# Patient Record
Sex: Male | Born: 1980 | Race: White | Hispanic: No | Marital: Married | Smoking: Former smoker
Health system: Southern US, Community
[De-identification: ages and names within clinical notes are randomized; demographics above are authoritative.]

## PROBLEM LIST (undated history)

## (undated) DIAGNOSIS — K852 Alcohol induced acute pancreatitis without necrosis or infection: Secondary | ICD-10-CM

## (undated) DIAGNOSIS — S83249A Other tear of medial meniscus, current injury, unspecified knee, initial encounter: Secondary | ICD-10-CM

## (undated) DIAGNOSIS — I10 Essential (primary) hypertension: Secondary | ICD-10-CM

## (undated) DIAGNOSIS — Z98811 Dental restoration status: Secondary | ICD-10-CM

## (undated) DIAGNOSIS — K219 Gastro-esophageal reflux disease without esophagitis: Secondary | ICD-10-CM

## (undated) DIAGNOSIS — Z8719 Personal history of other diseases of the digestive system: Secondary | ICD-10-CM

## (undated) HISTORY — DX: Alcohol induced acute pancreatitis without necrosis or infection: K85.20

## (undated) HISTORY — PX: ANTERIOR CRUCIATE LIGAMENT REPAIR: SHX115

## (undated) HISTORY — DX: Essential (primary) hypertension: I10

---

## 1999-10-03 ENCOUNTER — Ambulatory Visit (HOSPITAL_BASED_OUTPATIENT_CLINIC_OR_DEPARTMENT_OTHER): Admission: RE | Admit: 1999-10-03 | Discharge: 1999-10-04 | Payer: Self-pay | Admitting: Orthopedic Surgery

## 2012-01-12 ENCOUNTER — Encounter (INDEPENDENT_AMBULATORY_CARE_PROVIDER_SITE_OTHER): Payer: Self-pay | Admitting: Surgery

## 2012-01-14 ENCOUNTER — Encounter (INDEPENDENT_AMBULATORY_CARE_PROVIDER_SITE_OTHER): Payer: Self-pay | Admitting: Surgery

## 2012-01-16 ENCOUNTER — Ambulatory Visit (INDEPENDENT_AMBULATORY_CARE_PROVIDER_SITE_OTHER): Payer: BC Managed Care – PPO | Admitting: General Surgery

## 2012-01-16 ENCOUNTER — Encounter (INDEPENDENT_AMBULATORY_CARE_PROVIDER_SITE_OTHER): Payer: Self-pay | Admitting: General Surgery

## 2012-01-16 VITALS — BP 138/90 | HR 64 | Temp 97.6°F | Resp 16 | Ht 72.0 in | Wt 230.8 lb

## 2012-01-16 DIAGNOSIS — K645 Perianal venous thrombosis: Secondary | ICD-10-CM

## 2012-01-16 NOTE — Progress Notes (Signed)
Patient ID: Shane Nguyen, male   DOB: [birth year], 31 y.o.   MRN: [id number]  Chief Complaint  Patient presents with  . Hemorrhoids    urgent- eval thromb hems    HPI Shane Nguyen is a 31 y.o. male.  He is referred by Dr. Jolyne Loa for management of a thrombosed external hemorrhoid.  The patient thinks he had a thrombosed hemorrhoid 2 years ago, took suppositories, spontaneously drained. He has occasional hemorrhoid discomfort when he gets constipated and doesn't see any blood.  He now has a 6 day history of pain and swelling but no bleeding in his perianal area.  Comorbidities include hypertension and anxiety. HPI  Past Medical History  Diagnosis Date  . Hypertension   . Hemorrhoids     Past Surgical History  Procedure Date  . Anterior cruciate ligament repair     bilateral    History reviewed. No pertinent family history.  Social History History  Substance Use Topics  . Smoking status: Former Smoker    Quit date: 02/11/2004  . Smokeless tobacco: Not on file  . Alcohol Use: No    No Known Allergies  Current Outpatient Prescriptions  Medication Sig Dispense Refill  . ALPRAZolam (XANAX PO) Take by mouth.      Marland Kitchen HYDRALAZINE-HCTZ PO Take by mouth.      . hydrocortisone (ANUSOL-HC) 25 MG suppository daily.        Review of Systems Review of Systems  Constitutional: Negative for fever, chills and unexpected weight change.  HENT: Negative for hearing loss, congestion, sore throat, trouble swallowing and voice change.   Eyes: Negative for visual disturbance.  Respiratory: Negative for cough and wheezing.   Cardiovascular: Negative for chest pain, palpitations and leg swelling.  Gastrointestinal: Positive for rectal pain. Negative for nausea, vomiting, abdominal pain, diarrhea, constipation, blood in stool, abdominal distention and anal bleeding.  Genitourinary: Negative for hematuria and difficulty urinating.  Musculoskeletal: Negative for arthralgias.   Skin: Negative for rash and wound.  Neurological: Negative for seizures, syncope, weakness and headaches.  Hematological: Negative for adenopathy. Does not bruise/bleed easily.  Psychiatric/Behavioral: Negative for confusion.    Blood pressure 138/90, pulse 64, temperature 97.6 F (36.4 C), temperature source Temporal, resp. rate 16, height 6' (1.829 m), weight 230 lb 12.8 oz (104.69 kg).  Physical Exam Physical Exam  Constitutional: He is oriented to person, place, and time. He appears well-developed and well-nourished. No distress.  HENT:  Head: Normocephalic and atraumatic.  Genitourinary:       Large thrombosed external hemorrhoid,  left lateral. After informed consent this area was prepped with alcohol, anesthetized with 1% Xylocaine with epinephrine, and conservatively excised and unroofed. Hemostasis excellent. Tolerated well. Dry gauze bandage.  Musculoskeletal: Normal range of motion. He exhibits no edema and no tenderness.  Neurological: He is alert and oriented to person, place, and time. Coordination normal.  Skin: Skin is warm and dry. No rash noted. He is not diaphoretic. No erythema. No pallor.  Psychiatric: He has a normal mood and affect. His behavior is normal. Judgment and thought content normal.    Data Reviewed Office note from Dr. Kathryne Sharper office.  Assessment    Thrombosed external hemorrhoid, left lateral    Plan    Thrombosed hemorrhoid excised in the office today. Tolerated well.  Wound care instructions given. Bowel regimen discussed.  Return if this does not heal completely in 2 weeks.       Angelia Mould. Derrell Lolling, M.D., [facility] Office:   [phone] Pager:   [phone]  01/16/2012, 4:09 PM

## 2012-01-16 NOTE — Patient Instructions (Signed)
We excised a thrombosed hemorrhoid on the left lateral position today. This leaves an open wound that should completely heal and in 10-14 days.  Take a stool softener twice a day and avoid constipation.  Take a warm tub bath 3 times a day.  Expect some drainage, wear a pad.  Use baby wipes  for 2 weeks.  Return to see Dr. Derrell Lolling if this does not heal completely in 2 weeks.

## 2012-01-19 ENCOUNTER — Ambulatory Visit (INDEPENDENT_AMBULATORY_CARE_PROVIDER_SITE_OTHER): Payer: Self-pay | Admitting: Surgery

## 2012-01-22 ENCOUNTER — Encounter (INDEPENDENT_AMBULATORY_CARE_PROVIDER_SITE_OTHER): Payer: Self-pay | Admitting: General Surgery

## 2012-01-22 NOTE — Progress Notes (Signed)
Faxed office visit note from 01/16/12 tot he attention of Lupita Leash at [facility].  FAX # [phone]. Confirmation received.

## 2013-01-12 ENCOUNTER — Encounter: Payer: Self-pay | Admitting: Physician Assistant

## 2013-01-12 DIAGNOSIS — E785 Hyperlipidemia, unspecified: Secondary | ICD-10-CM

## 2013-01-12 DIAGNOSIS — E559 Vitamin D deficiency, unspecified: Secondary | ICD-10-CM | POA: Insufficient documentation

## 2013-01-12 DIAGNOSIS — E782 Mixed hyperlipidemia: Secondary | ICD-10-CM | POA: Insufficient documentation

## 2013-01-12 DIAGNOSIS — I10 Essential (primary) hypertension: Secondary | ICD-10-CM

## 2013-01-13 NOTE — Progress Notes (Signed)
Patient ID: Shane Nguyen, male   DOB: January 25, 1981, 32 y.o.   MRN: [id number]  Annual Screening Comprehensive Examination  This very nice 32 yo yo  WM presents for complete physical.  Patient has been followed for HTN, Hyperlipidemia, and Vitamin D Deficiency.   Patient's BP has been controlled at home. Patient denies any cardiac symptoms as chest pain, palpitations, shortness of breath, dizziness or ankle swelling.   Patient's hyperlipidemia is controlled with diet and exercise. Patient denies myalgias or other medication SE's. Last cholesterol last visit was  , triglycerides  , and LDL   .     Patient has prediabetes/insulin resistance with last A1c    / insulin   . Patient denies reactive h   Finally, patient has history of Vitamin D Deficiency with last vitamin D      Current Outpatient Prescriptions on File Prior to Visit  Medication Sig Dispense Refill  . ALPRAZolam (XANAX PO) Take by mouth.      . bisoprolol-hydrochlorothiazide (ZIAC) 5-6.25 MG per tablet Take 1 tablet by mouth daily.      . cholecalciferol (VITAMIN D) 1000 UNITS tablet Take 1,000 Units by mouth daily.      . Flaxseed, Linseed, (FLAXSEED OIL) 1000 MG CAPS Take by mouth.      . loratadine (CLARITIN) 10 MG tablet Take 10 mg by mouth daily.      . Melatonin 1 MG CAPS Take by mouth.      . Omega-3 Fatty Acids (FISH OIL) 1000 MG CPDR Take by mouth.       No current facility-administered medications on file prior to visit.    No Known Allergies  Past Medical History  Diagnosis Date  . Hypertension   . Hemorrhoids   . Hyperlipidemia   . Vitamin D deficiency   . Insomnia     Past Surgical History  Procedure Laterality Date  . Anterior cruciate ligament repair      bilateral  . Knee arthroscopy Right 1997    murphy    Family History  Problem Relation Age of Onset  . Hypertension Father   . Hyperlipidemia Father     History   Social History  . Marital Status: Married    Spouse Name: N/A    Number  of Children: N/A  . Years of Education: N/A   Occupational History  . Not on file.   Social History Main Topics  . Smoking status: Former Smoker -- 1.00 packs/day for 10 years    Quit date: 02/11/2004  . Smokeless tobacco: Never Used  . Alcohol Use: Yes     Comment: 1/2 of a 5th q weekend  . Drug Use: No  . Sexual Activity: Not on file   Other Topics Concern  . Not on file   Social History Narrative  . No narrative on file    ROS Constitutional: Denies fever, chills, weight loss/gain, headaches, insomnia, fatigue, night sweats, and change in appetite. Eyes: Denies redness, blurred vision, diplopia, discharge, itchy, watery eyes.  ENT: Denies discharge, congestion, post nasal drip, epistaxis, sore throat, earache, hearing loss, dental pain, Tinnitus, Vertigo, Sinus pain, snoring.  Cardio: Denies chest pain, palpitations, irregular heartbeat, syncope, dyspnea, diaphoresis, orthopnea, PND, claudication, edema Respiratory: denies cough, dyspnea, DOE, pleurisy, hoarseness, laryngitis, wheezing.  Gastrointestinal: Denies dysphagia, heartburn, reflux, water brash, pain, cramps, nausea, vomiting, bloating, diarrhea, constipation, hematemesis, melena, hematochezia, jaundice, hemorrhoids Genitourinary: Denies dysuria, frequency, urgency, nocturia, hesitancy, discharge, hematuria, flank pain Musculoskeletal: Denies arthralgia, myalgia, stiffness, Jt.  Swelling, pain, limp, and strain/sprain. Skin: Denies puritis, rash, hives, warts, acne, eczema, changing in skin lesion Neuro: No weakness, tremor, incoordination, spasms, paresthesia, pain Psychiatric: Denies confusion, memory loss, sensory loss Endocrine: Denies change in weight, skin, hair change, nocturia, and paresthesia, diabetic polys, visual blurring, hyper / hypo glycemic episodes.  Heme/Lymph: No excessive bleeding, bruising, or elarged lymph nodes.  There were no vitals filed for this visit.  Estimated body mass index is 31.30  kg/(m^2) as calculated from the following:   Height as of 01/16/12: 6' (1.829 m).   Weight as of 01/16/12: 230 lb 12.8 oz (104.69 kg).  Physical Exam General Appearance: Well nourished, in no apparent distress. Eyes: PERRLA, EOMs, conjunctiva no swelling or erythema, normal fundi and vessels. Sinuses: No frontal/maxillary tenderness ENT/Mouth: EACs patent / TMs  nl. Nares clear without erythema, swelling, mucoid exudates. Oral hygiene is good. No erythema, swelling, or exudate. Tongue normal, non-obstructing. Tonsils not swollen or erythematous. Hearing normal.  Neck: Supple, thyroid normal. No bruits, nodes or JVD. Respiratory: Respiratory effort normal.  BS equal and clear bilateral without rales, rhonci, wheezing or stridor. Cardio: Heart sounds are normal with regular rate and rhythm and no murmurs, rubs or gallops. Peripheral pulses are normal and equal bilaterally without edema. No aortic or femoral bruits. Chest: symmetric with normal excursions and percussion.  Abdomen: Flat, soft, with bowl sounds. Nontender, no guarding, rebound, hernias, masses, or organomegaly.  Lymphatics: Non tender without lymphadenopathy.  Genitourinary: No hernias.Testes nl. DRE - prostate nl for age - smooth & firm w/o nodules. Musculoskeletal: Full ROM all peripheral extremities, joint stability, 5/5 strength, and normal gait. Skin: Warm and dry without rashes, lesions, cyanosis, clubbing or  ecchymosis.  Neuro: Cranial nerves intact, reflexes equal bilaterally. Normal muscle tone, no cerebellar symptoms. Sensation intact.  Pysch: Awake and oriented X 3, normal affect, insight and judgment appropriate.   Assessment and Plan  1. Annual Screening Examination 2. Hypertension  3. Hyperlipidemia 4. Pre Diabetes 5. Vitamin D Deficiency  Continue prudent diet as discussed, weight control, BP monitoring, regular exercise, and medications as discussed.  Discussed med effects and SE's. Routine screening labs and  tests as requested with regular follow-up as recommended.  This encounter was created in error - please disregard.

## 2013-01-13 NOTE — Patient Instructions (Signed)
Continue diet & medications same as discussed.   Further disposition pending lab results.     Hypertension As your heart beats, it forces blood through your arteries. This force is your blood pressure. If the pressure is too high, it is called hypertension (HTN) or high blood pressure. HTN is dangerous because you may have it and not know it. High blood pressure may mean that your heart has to work harder to pump blood. Your arteries may be narrow or stiff. The extra work puts you at risk for heart disease, stroke, and other problems.  Blood pressure consists of two numbers, a higher number over a lower, 110/72, for example. It is stated as "110 over 72." The ideal is below 120 for the top number (systolic) and under 80 for the bottom (diastolic). Write down your blood pressure today. You should pay close attention to your blood pressure if you have certain conditions such as:  Heart failure.  Prior heart attack.  Diabetes  Chronic kidney disease.  Prior stroke.  Multiple risk factors for heart disease. To see if you have HTN, your blood pressure should be measured while you are seated with your arm held at the level of the heart. It should be measured at least twice. A one-time elevated blood pressure reading (especially in the Emergency Department) does not mean that you need treatment. There may be conditions in which the blood pressure is different between your right and left arms. It is important to see your caregiver soon for a recheck. Most people have essential hypertension which means that there is not a specific cause. This type of high blood pressure may be lowered by changing lifestyle factors such as:  Stress.  Smoking.  Lack of exercise.  Excessive weight.  Drug/tobacco/alcohol use.  Eating less salt. Most people do not have symptoms from high blood pressure until it has caused damage to the body. Effective treatment can often prevent, delay or reduce that  damage. TREATMENT  When a cause has been identified, treatment for high blood pressure is directed at the cause. There are a large number of medications to treat HTN. These fall into several categories, and your caregiver will help you select the medicines that are best for you. Medications may have side effects. You should review side effects with your caregiver. If your blood pressure stays high after you have made lifestyle changes or started on medicines,   Your medication(s) may need to be changed.  Other problems may need to be addressed.  Be certain you understand your prescriptions, and know how and when to take your medicine.  Be sure to follow up with your caregiver within the time frame advised (usually within two weeks) to have your blood pressure rechecked and to review your medications.  If you are taking more than one medicine to lower your blood pressure, make sure you know how and at what times they should be taken. Taking two medicines at the same time can result in blood pressure that is too low. SEEK IMMEDIATE MEDICAL CARE IF:  You develop a severe headache, blurred or changing vision, or confusion.  You have unusual weakness or numbness, or a faint feeling.  You have severe chest or abdominal pain, vomiting, or breathing problems. MAKE SURE YOU:   Understand these instructions.  Will watch your condition.  Will get help right away if you are not doing well or get worse. Document Released: 01/27/2005 Document Revised: 04/21/2011 Document Reviewed: 09/17/2007 Exit[employer] Patient Information 2014   [employer]. Cholesterol Cholesterol is a white, waxy, fat-like protein needed by your body in small amounts. The liver makes all the cholesterol you need. It is carried from the liver by the blood through the blood vessels. Deposits (plaque) may build up on blood vessel walls. This makes the arteries narrower and stiffer. Plaque increases the risk for heart attack and  stroke. You cannot feel your cholesterol level even if it is very high. The only way to know is by a blood test to check your lipid (fats) levels. Once you know your cholesterol levels, you should keep a record of the test results. Work with your caregiver to to keep your levels in the desired range. WHAT THE RESULTS MEAN:  Total cholesterol is a rough measure of all the cholesterol in your blood.  LDL is the so-called bad cholesterol. This is the type that deposits cholesterol in the walls of the arteries. You want this level to be low.  HDL is the good cholesterol because it cleans the arteries and carries the LDL away. You want this level to be high.  Triglycerides are fat that the body can either burn for energy or store. High levels are closely linked to heart disease. DESIRED LEVELS:  Total cholesterol below 200.  LDL below 100 for people at risk, below 70 for very high risk.  HDL above 50 is good, above 60 is best.  Triglycerides below 150. HOW TO LOWER YOUR CHOLESTEROL:  Diet.  Choose fish or white meat chicken and turkey, roasted or baked. Limit fatty cuts of red meat, fried foods, and processed meats, such as sausage and lunch meat.  Eat lots of fresh fruits and vegetables. Choose whole grains, beans, pasta, potatoes and cereals.  Use only small amounts of olive, corn or canola oils. Avoid butter, mayonnaise, shortening or palm kernel oils. Avoid foods with trans-fats.  Use skim/nonfat milk and low-fat/nonfat yogurt and cheeses. Avoid whole milk, cream, ice cream, egg yolks and cheeses. Healthy desserts include angel food cake, ginger snaps, animal crackers, hard candy, popsicles, and low-fat/nonfat frozen yogurt. Avoid pastries, cakes, pies and cookies.  Exercise.  A regular program helps decrease LDL and raises HDL.  Helps with weight control.  Do things that increase your activity level like gardening, walking, or taking the stairs.  Medication.  May be  prescribed by your caregiver to help lowering cholesterol and the risk for heart disease.  You may need medicine even if your levels are normal if you have several risk factors. HOME CARE INSTRUCTIONS   Follow your diet and exercise programs as suggested by your caregiver.  Take medications as directed.  Have blood work done when your caregiver feels it is necessary. MAKE SURE YOU:   Understand these instructions.  Will watch your condition.  Will get help right away if you are not doing well or get worse. Document Released: 10/22/2000 Document Revised: 04/21/2011 Document Reviewed: 04/14/2007 [employer] Patient Information 2014 [employer]. Vitamin D Deficiency Vitamin D is an important vitamin that your body needs. Having too little of it in your body is called a deficiency. A very bad deficiency can make your bones soft and can cause a condition called rickets.  Vitamin D is important to your body for different reasons, such as:   It helps your body absorb 2 minerals called calcium and phosphorus.  It helps make your bones healthy.  It may prevent some diseases, such as diabetes and multiple sclerosis.  It helps your muscles and heart.   You can get vitamin D in several ways. It is a natural part of some foods. The vitamin is also added to some dairy products and cereals. Some people take vitamin D supplements. Also, your body makes vitamin D when you are in the sun. It changes the sun's rays into a form of the vitamin that your body can use. CAUSES   Not eating enough foods that contain vitamin D.  Not getting enough sunlight.  Having certain digestive system diseases that make it hard to absorb vitamin D. These diseases include Crohn's disease, chronic pancreatitis, and cystic fibrosis.  Having a surgery in which part of the stomach or small intestine is removed.  Being obese. Fat cells pull vitamin D out of your blood. That means that obese people may not have enough  vitamin D left in their blood and in other body tissues.  Having chronic kidney or liver disease. RISK FACTORS Risk factors are things that make you more likely to develop a vitamin D deficiency. They include:  Being older.  Not being able to get outside very much.  Living in a nursing home.  Having had broken bones.  Having weak or thin bones (osteoporosis).  Having a disease or condition that changes how your body absorbs vitamin D.  Having dark skin.  Some medicines such as seizure medicines or steroids.  Being overweight or obese. SYMPTOMS Mild cases of vitamin D deficiency may not have any symptoms. If you have a very bad case, symptoms may include:  Bone pain.  Muscle pain.  Falling often.  Broken bones caused by a minor injury, due to osteoporosis. DIAGNOSIS A blood test is the best way to tell if you have a vitamin D deficiency. TREATMENT Vitamin D deficiency can be treated in different ways. Treatment for vitamin D deficiency depends on what is causing it. Options include:  Taking vitamin D supplements.  Taking a calcium supplement. Your caregiver will suggest what dose is best for you. HOME CARE INSTRUCTIONS  Take any supplements that your caregiver prescribes. Follow the directions carefully. Take only the suggested amount.  Have your blood tested 2 months after you start taking supplements.  Eat foods that contain vitamin D. Healthy choices include:  Fortified dairy products, cereals, or juices. Fortified means vitamin D has been added to the food. Check the label on the package to be sure.  Fatty fish like salmon or trout.  Eggs.  Oysters.  Do not use a tanning bed.  Keep your weight at a healthy level. Lose weight if you need to.  Keep all follow-up appointments. Your caregiver will need to perform blood tests to make sure your vitamin D deficiency is going away. SEEK MEDICAL CARE IF:  You have any questions about your treatment.  You  continue to have symptoms of vitamin D deficiency.  You have nausea or vomiting.  You are constipated.  You feel confused.  You have severe abdominal or back pain. MAKE SURE YOU:  Understand these instructions.  Will watch your condition.  Will get help right away if you are not doing well or get worse. Document Released: 04/21/2011 Document Revised: 05/24/2012 Document Reviewed: 04/21/2011 [employer] Patient Information 2014 [employer].  

## 2013-01-14 ENCOUNTER — Encounter: Payer: Self-pay | Admitting: Internal Medicine

## 2013-01-16 ENCOUNTER — Encounter: Payer: Self-pay | Admitting: Internal Medicine

## 2013-01-16 NOTE — Progress Notes (Signed)
Patient ID: Shane Nguyen, male   DOB: 02-06-1981, 32 y.o.   MRN: [id number]

## 2013-05-09 ENCOUNTER — Other Ambulatory Visit: Payer: Self-pay | Admitting: Emergency Medicine

## 2013-05-27 ENCOUNTER — Ambulatory Visit (INDEPENDENT_AMBULATORY_CARE_PROVIDER_SITE_OTHER): Payer: BC Managed Care – PPO | Admitting: Physician Assistant

## 2013-05-27 ENCOUNTER — Encounter: Payer: Self-pay | Admitting: Physician Assistant

## 2013-05-27 VITALS — BP 110/72 | HR 60 | Temp 97.9°F | Resp 16 | Ht 72.0 in | Wt 232.0 lb

## 2013-05-27 DIAGNOSIS — E559 Vitamin D deficiency, unspecified: Secondary | ICD-10-CM

## 2013-05-27 DIAGNOSIS — Z79899 Other long term (current) drug therapy: Secondary | ICD-10-CM

## 2013-05-27 DIAGNOSIS — E785 Hyperlipidemia, unspecified: Secondary | ICD-10-CM

## 2013-05-27 DIAGNOSIS — E782 Mixed hyperlipidemia: Secondary | ICD-10-CM

## 2013-05-27 DIAGNOSIS — I10 Essential (primary) hypertension: Secondary | ICD-10-CM

## 2013-05-27 LAB — CBC WITH DIFFERENTIAL/PLATELET
BASOS ABS: 0 10*3/uL (ref 0.0–0.1)
Basophils Relative: 1 % (ref 0–1)
EOS PCT: 2 % (ref 0–5)
Eosinophils Absolute: 0.1 10*3/uL (ref 0.0–0.7)
HEMATOCRIT: 45.2 % (ref 39.0–52.0)
Hemoglobin: 15.8 g/dL (ref 13.0–17.0)
Lymphocytes Relative: 29 % (ref 12–46)
Lymphs Abs: 1 10*3/uL (ref 0.7–4.0)
MCH: 29.1 pg (ref 26.0–34.0)
MCHC: 35 g/dL (ref 30.0–36.0)
MCV: 83.2 fL (ref 78.0–100.0)
MONOS PCT: 11 % (ref 3–12)
Monocytes Absolute: 0.4 10*3/uL (ref 0.1–1.0)
NEUTROS ABS: 2.1 10*3/uL (ref 1.7–7.7)
Neutrophils Relative %: 57 % (ref 43–77)
Platelets: 218 10*3/uL (ref 150–400)
RBC: 5.43 MIL/uL (ref 4.22–5.81)
RDW: 13.3 % (ref 11.5–15.5)
WBC: 3.6 10*3/uL — ABNORMAL LOW (ref 4.0–10.5)

## 2013-05-27 LAB — BASIC METABOLIC PANEL WITH GFR
BUN: 12 mg/dL (ref 6–23)
CHLORIDE: 100 meq/L (ref 96–112)
CO2: 29 mEq/L (ref 19–32)
CREATININE: 0.87 mg/dL (ref 0.50–1.35)
Calcium: 9.8 mg/dL (ref 8.4–10.5)
Glucose, Bld: 75 mg/dL (ref 70–99)
Potassium: 4.3 mEq/L (ref 3.5–5.3)
Sodium: 139 mEq/L (ref 135–145)

## 2013-05-27 LAB — LIPID PANEL
CHOL/HDL RATIO: 4.2 ratio
Cholesterol: 156 mg/dL (ref 0–200)
HDL: 37 mg/dL — AB (ref >39)
LDL Cholesterol: 102 mg/dL — ABNORMAL HIGH (ref 0–99)
TRIGLYCERIDES: 84 mg/dL (ref <150)
VLDL: 17 mg/dL (ref 0–40)

## 2013-05-27 LAB — HEPATIC FUNCTION PANEL
ALBUMIN: 4.5 g/dL (ref 3.5–5.2)
ALK PHOS: 42 U/L (ref 39–117)
ALT: 18 U/L (ref 0–53)
AST: 23 U/L (ref 0–37)
BILIRUBIN TOTAL: 0.6 mg/dL (ref 0.2–1.2)
Bilirubin, Direct: 0.1 mg/dL (ref 0.0–0.3)
Indirect Bilirubin: 0.5 mg/dL (ref 0.2–1.2)
Total Protein: 7.3 g/dL (ref 6.0–8.3)

## 2013-05-27 LAB — MAGNESIUM: Magnesium: 1.9 mg/dL (ref 1.5–2.5)

## 2013-05-27 MED ORDER — BISOPROLOL-HYDROCHLOROTHIAZIDE 5-6.25 MG PO TABS: ORAL_TABLET | ORAL | Status: DC

## 2013-05-27 MED ORDER — DICLOFENAC 35 MG PO CAPS: ORAL_CAPSULE | ORAL | Status: DC

## 2013-05-27 NOTE — Progress Notes (Signed)
HPI 33 y.o. male  presents for 3 month follow up with hypertension, hyperlipidemia, prediabetes and vitamin D. His blood pressure has been controlled at home, today their BP is BP: 110/72 mmHg He does not workout. He denies chest pain, shortness of breath, dizziness.  He is not on cholesterol medication and denies myalgias. His cholesterol is at goal. The cholesterol last visit was:  LDL 99  Last A1C in the office was: 4.5 Patient is on Vitamin D supplement.   Increasing stress at home, his father in law had a stroke recently.  He is having right shoulder pain, worse with abduction, has history of neck injury moving furniture years ago with numbness down his arm but he is not having any of that right now.   Current Medications:  Current Outpatient Prescriptions on File Prior to Visit  Medication Sig Dispense Refill  . ALPRAZolam (XANAX PO) Take by mouth.      . bisoprolol-hydrochlorothiazide (ZIAC) 5-6.25 MG per tablet TAKE ONE TABLET BY MOUTH ONCE DAILY  30 tablet  0  . cholecalciferol (VITAMIN D) 1000 UNITS tablet Take 1,000 Units by mouth daily.      . Flaxseed, Linseed, (FLAXSEED OIL) 1000 MG CAPS Take by mouth.      . loratadine (CLARITIN) 10 MG tablet Take 10 mg by mouth daily.      . Melatonin 1 MG CAPS Take by mouth.      . Omega-3 Fatty Acids (FISH OIL) 1000 MG CPDR Take by mouth.       No current facility-administered medications on file prior to visit.   Medical History:  Past Medical History  Diagnosis Date  . Hypertension   . Hemorrhoids   . Hyperlipidemia   . Vitamin D deficiency   . Insomnia    Allergies: No Known Allergies   Review of Systems: [X]  = complains of  [ ]  = denies  General: Fatigue [ ]  Fever [ ]  Chills [ ]  Weakness [ ]   Insomnia [ ]  Eyes: Redness [ ]  Blurred vision [ ]  Diplopia [ ]   ENT: Congestion [ ]  Sinus Pain [ ]  Post Nasal Drip [ ]  Sore Throat [ ]  Earache [ ]   Cardiac: Chest pain/pressure [ ]  SOB [ ]  Orthopnea [ ]   Palpitations [ ]   Paroxysmal  nocturnal dyspnea[ ]  Claudication [ ]  Edema [ ]   Pulmonary: Cough [ ]  Wheezing[ ]   SOB [ ]   Snoring [ ]   GI: Nausea [ ]  Vomiting[ ]  Dysphagia[ ]  Heartburn[ ]  Abdominal pain [ ]  Constipation [ ] ; Diarrhea [ ] ; BRBPR [ ]  Melena[ ]  GU: Hematuria[ ]  Dysuria [ ]  Nocturia[ ]  Urgency [ ]   Hesitancy [ ]  Discharge [ ]  Neuro: Headaches[ ]  Vertigo[ ]  Paresthesias[ ]  Spasm [ ]  Speech changes [ ]  Incoordination [ ]   Ortho: Arthritis [ ]  Joint pain [ ]  Muscle pain [ ]  Joint swelling [ ]  Back Pain [ ]  Skin:  Rash [ ]   Pruritis [ ]  Change in skin lesion [ ]   Psych: Depression[ ]  Anxiety[ ]  Confusion [ ]  Memory loss [ ]   Heme/Lypmh: Bleeding [ ]  Bruising [ ]  Enlarged lymph nodes [ ]   Endocrine: Visual blurring [ ]  Paresthesia [ ]  Polyuria [ ]  Polydypsea [ ]    Heat/cold intolerance [ ]  Hypoglycemia [ ]   Family history- Review and unchanged Social history- Review and unchanged Physical Exam: BP 110/72  Pulse 60  Temp(Src) 97.9 F (36.6 C)  Resp 16  Ht 6' (1.829 m)  Wt 232  lb (105.235 kg)  BMI 31.46 kg/m2 Wt Readings from Last 3 Encounters:  05/27/13 232 lb (105.235 kg)  01/16/12 230 lb 12.8 oz (104.69 kg)   General Appearance: Well nourished, in no apparent distress. Eyes: PERRLA, EOMs, conjunctiva no swelling or erythema Sinuses: No Frontal/maxillary tenderness ENT/Mouth: Ext aud canals clear, TMs without erythema, bulging. No erythema, swelling, or exudate on post pharynx.  Tonsils not swollen or erythematous. Hearing normal.  Neck: Supple, thyroid normal.  Respiratory: Respiratory effort normal, BS equal bilaterally without rales, rhonchi, wheezing or stridor.  Cardio: RRR with no MRGs. Brisk peripheral pulses without edema.  Abdomen: Soft, + BS.  Non tender, no guarding, rebound, hernias, masses. Lymphatics: Non tender without lymphadenopathy.  Musculoskeletal: Full ROM, 5/5 strength, normal gait.  Skin: Warm, dry without rashes, lesions, ecchymosis.  Neuro: Cranial nerves intact. Normal  muscle tone, no cerebellar symptoms. Sensation intact.  Psych: Awake and oriented X 3, normal affect, Insight and Judgment appropriate.   Assessment and Plan:  Hypertension: Continue medication, monitor blood pressure at home. Continue DASH diet. Cholesterol: Continue diet and exercise. Check cholesterol.  Pre-diabetes-Continue diet and exercise. Check A1C Vitamin D Def- check level and continue medications.  Right shoulder-Continue diflucan, RICE, and exercise given, can get brace If not better we will do an injection and get xray of neck   Continue diet and meds as discussed. Further disposition pending results of labs.  Quentin MullingAmanda Ellwyn Ergle 10:34 AM

## 2013-05-27 NOTE — Patient Instructions (Signed)

## 2013-05-28 LAB — VITAMIN D 25 HYDROXY (VIT D DEFICIENCY, FRACTURES): Vit D, 25-Hydroxy: 55 ng/mL (ref 30–89)

## 2013-05-28 LAB — TSH: TSH: 0.949 u[IU]/mL (ref 0.350–4.500)

## 2013-06-24 ENCOUNTER — Other Ambulatory Visit: Payer: Self-pay | Admitting: Internal Medicine

## 2013-09-02 ENCOUNTER — Encounter: Payer: Self-pay | Admitting: Internal Medicine

## 2013-09-02 ENCOUNTER — Ambulatory Visit (INDEPENDENT_AMBULATORY_CARE_PROVIDER_SITE_OTHER): Payer: BC Managed Care – PPO | Admitting: Internal Medicine

## 2013-09-02 VITALS — BP 118/70 | HR 58 | Temp 98.2°F | Resp 18 | Ht 72.0 in | Wt 228.0 lb

## 2013-09-02 DIAGNOSIS — Z0000 Encounter for general adult medical examination without abnormal findings: Secondary | ICD-10-CM

## 2013-09-02 DIAGNOSIS — Z113 Encounter for screening for infections with a predominantly sexual mode of transmission: Secondary | ICD-10-CM

## 2013-09-02 DIAGNOSIS — I10 Essential (primary) hypertension: Secondary | ICD-10-CM

## 2013-09-02 DIAGNOSIS — Z79899 Other long term (current) drug therapy: Secondary | ICD-10-CM | POA: Insufficient documentation

## 2013-09-02 DIAGNOSIS — R740 Nonspecific elevation of levels of transaminase and lactic acid dehydrogenase [LDH]: Secondary | ICD-10-CM

## 2013-09-02 DIAGNOSIS — Z125 Encounter for screening for malignant neoplasm of prostate: Secondary | ICD-10-CM

## 2013-09-02 DIAGNOSIS — E559 Vitamin D deficiency, unspecified: Secondary | ICD-10-CM

## 2013-09-02 DIAGNOSIS — Z111 Encounter for screening for respiratory tuberculosis: Secondary | ICD-10-CM

## 2013-09-02 DIAGNOSIS — R7401 Elevation of levels of liver transaminase levels: Secondary | ICD-10-CM

## 2013-09-02 DIAGNOSIS — Z1212 Encounter for screening for malignant neoplasm of rectum: Secondary | ICD-10-CM

## 2013-09-02 DIAGNOSIS — R7402 Elevation of levels of lactic acid dehydrogenase (LDH): Secondary | ICD-10-CM

## 2013-09-02 LAB — CBC WITH DIFFERENTIAL/PLATELET
BASOS ABS: 0 10*3/uL (ref 0.0–0.1)
Basophils Relative: 1 % (ref 0–1)
EOS ABS: 0.1 10*3/uL (ref 0.0–0.7)
EOS PCT: 2 % (ref 0–5)
HCT: 43.6 % (ref 39.0–52.0)
Hemoglobin: 15 g/dL (ref 13.0–17.0)
Lymphocytes Relative: 33 % (ref 12–46)
Lymphs Abs: 1.3 10*3/uL (ref 0.7–4.0)
MCH: 28.8 pg (ref 26.0–34.0)
MCHC: 34.4 g/dL (ref 30.0–36.0)
MCV: 83.8 fL (ref 78.0–100.0)
Monocytes Absolute: 0.4 10*3/uL (ref 0.1–1.0)
Monocytes Relative: 11 % (ref 3–12)
Neutro Abs: 2 10*3/uL (ref 1.7–7.7)
Neutrophils Relative %: 53 % (ref 43–77)
PLATELETS: 215 10*3/uL (ref 150–400)
RBC: 5.2 MIL/uL (ref 4.22–5.81)
RDW: 12.9 % (ref 11.5–15.5)
WBC: 3.8 10*3/uL — ABNORMAL LOW (ref 4.0–10.5)

## 2013-09-02 LAB — HEMOGLOBIN A1C
Hgb A1c MFr Bld: 5.3 % (ref <5.7)
MEAN PLASMA GLUCOSE: 105 mg/dL (ref <117)

## 2013-09-02 MED ORDER — BISOPROLOL-HYDROCHLOROTHIAZIDE 5-6.25 MG PO TABS: ORAL_TABLET | ORAL | Status: DC

## 2013-09-02 MED ORDER — ALPRAZOLAM 1 MG PO TABS: 1.0000 mg | ORAL_TABLET | Freq: Three times a day (TID) | ORAL | Status: DC | PRN

## 2013-09-02 NOTE — Patient Instructions (Signed)
Preventive Care for Adults A healthy lifestyle and preventive care can promote health and wellness. Preventive health guidelines for men include the following key practices:  A routine yearly physical is a good way to check with your health care provider about your health and preventative screening. It is a chance to share any concerns and updates on your health and to receive a thorough exam.  Visit your dentist for a routine exam and preventative care every 6 months. Brush your teeth twice a day and floss once a day. Good oral hygiene prevents tooth decay and gum disease.  The frequency of eye exams is based on your age, health, family medical history, use of contact lenses, and other factors. Follow your health care provider's recommendations for frequency of eye exams.  Eat a healthy diet. Foods such as vegetables, fruits, whole grains, low-fat dairy products, and lean protein foods contain the nutrients you need without too many calories. Decrease your intake of foods high in solid fats, added sugars, and salt. Eat the right amount of calories for you.Get information about a proper diet from your health care provider, if necessary.  Regular physical exercise is one of the most important things you can do for your health. Most adults should get at least 150 minutes of moderate-intensity exercise (any activity that increases your heart rate and causes you to sweat) each week. In addition, most adults need muscle-strengthening exercises on 2 or more days a week.  Maintain a healthy weight. The body mass index (BMI) is a screening tool to identify possible weight problems. It provides an estimate of body fat based on height and weight. Your health care provider can find your BMI and can help you achieve or maintain a healthy weight.For adults 20 years and older:  A BMI below 18.5 is considered underweight.  A BMI of 18.5 to 24.9 is normal.  A BMI of 25 to 29.9 is considered overweight.  A BMI  of 30 and above is considered obese.  Maintain normal blood lipids and cholesterol levels by exercising and minimizing your intake of saturated fat. Eat a balanced diet with plenty of fruit and vegetables. Blood tests for lipids and cholesterol should begin at age 50 and be repeated every 5 years. If your lipid or cholesterol levels are high, you are over 50, or you are at high risk for heart disease, you may need your cholesterol levels checked more frequently.Ongoing high lipid and cholesterol levels should be treated with medicines if diet and exercise are not working.  If you smoke, find out from your health care provider how to quit. If you do not use tobacco, do not start.  Lung cancer screening is recommended for adults aged 73-80 years who are at high risk for developing lung cancer because of a history of smoking. A yearly low-dose CT scan of the lungs is recommended for people who have at least a 30-pack-year history of smoking and are a current smoker or have quit within the past 15 years. A pack year of smoking is smoking an average of 1 pack of cigarettes a day for 1 year (for example: 1 pack a day for 30 years or 2 packs a day for 15 years). Yearly screening should continue until the smoker has stopped smoking for at least 15 years. Yearly screening should be stopped for people who develop a health problem that would prevent them from having lung cancer treatment.  If you choose to drink alcohol, do not have more than  2 drinks per day. One drink is considered to be 12 ounces (355 mL) of beer, 5 ounces (148 mL) of wine, or 1.5 ounces (44 mL) of liquor.  Avoid use of street drugs. Do not share needles with anyone. Ask for help if you need support or instructions about stopping the use of drugs.  High blood pressure causes heart disease and increases the risk of stroke. Your blood pressure should be checked at least every 1-2 years. Ongoing high blood pressure should be treated with  medicines, if weight loss and exercise are not effective.  If you are 45-79 years old, ask your health care provider if you should take aspirin to prevent heart disease.  Diabetes screening involves taking a blood sample to check your fasting blood sugar level. This should be done once every 3 years, after age 45, if you are within normal weight and without risk factors for diabetes. Testing should be considered at a younger age or be carried out more frequently if you are overweight and have at least 1 risk factor for diabetes.  Colorectal cancer can be detected and often prevented. Most routine colorectal cancer screening begins at the age of 50 and continues through age 75. However, your health care provider may recommend screening at an earlier age if you have risk factors for colon cancer. On a yearly basis, your health care provider may provide home test kits to check for hidden blood in the stool. Use of a small camera at the end of a tube to directly examine the colon (sigmoidoscopy or colonoscopy) can detect the earliest forms of colorectal cancer. Talk to your health care provider about this at age 50, when routine screening begins. Direct exam of the colon should be repeated every 5-10 years through age 75, unless early forms of precancerous polyps or small growths are found.  People who are at an increased risk for hepatitis B should be screened for this virus. You are considered at high risk for hepatitis B if:  You were born in a country where hepatitis B occurs often. Talk with your health care provider about which countries are considered high risk.  Your parents were born in a high-risk country and you have not received a shot to protect against hepatitis B (hepatitis B vaccine).  You have HIV or AIDS.  You use needles to inject street drugs.  You live with, or have sex with, someone who has hepatitis B.  You are a man who has sex with other men (MSM).  You get hemodialysis  treatment.  You take certain medicines for conditions such as cancer, organ transplantation, and autoimmune conditions.  Hepatitis C blood testing is recommended for all people born from 1945 through 1965 and any individual with known risks for hepatitis C.  Practice safe sex. Use condoms and avoid high-risk sexual practices to reduce the spread of sexually transmitted infections (STIs). STIs include gonorrhea, chlamydia, syphilis, trichomonas, herpes, HPV, and human immunodeficiency virus (HIV). Herpes, HIV, and HPV are viral illnesses that have no cure. They can result in disability, cancer, and death.  If you are at risk of being infected with HIV, it is recommended that you take a prescription medicine daily to prevent HIV infection. This is called preexposure prophylaxis (PrEP). You are considered at risk if:  You are a man who has sex with other men (MSM) and have other risk factors.  You are a heterosexual man, are sexually active, and are at increased risk for HIV infection.    You take drugs by injection.  You are sexually active with a partner who has HIV.  Talk with your health care provider about whether you are at high risk of being infected with HIV. If you choose to begin PrEP, you should first be tested for HIV. You should then be tested every 3 months for as long as you are taking PrEP.  A one-time screening for abdominal aortic aneurysm (AAA) and surgical repair of large AAAs by ultrasound are recommended for men ages 32 to 67 years who are current or former smokers.  Healthy men should no longer receive prostate-specific antigen (PSA) blood tests as part of routine cancer screening. Talk with your health care provider about prostate cancer screening.  Testicular cancer screening is not recommended for adult males who have no symptoms. Screening includes self-exam, a health care provider exam, and other screening tests. Consult with your health care provider about any symptoms  you have or any concerns you have about testicular cancer.  Use sunscreen. Apply sunscreen liberally and repeatedly throughout the day. You should seek shade when your shadow is shorter than you. Protect yourself by wearing long sleeves, pants, a wide-brimmed hat, and sunglasses year round, whenever you are outdoors.  Once a month, do a whole-body skin exam, using a mirror to look at the skin on your back. Tell your health care provider about new moles, moles that have irregular borders, moles that are larger than a pencil eraser, or moles that have changed in shape or color.  Stay current with required vaccines (immunizations).  Influenza vaccine. All adults should be immunized every year.  Tetanus, diphtheria, and acellular pertussis (Td, Tdap) vaccine. An adult who has not previously received Tdap or who does not know his vaccine status should receive 1 dose of Tdap. This initial dose should be followed by tetanus and diphtheria toxoids (Td) booster doses every 10 years. Adults with an unknown or incomplete history of completing a 3-dose immunization series with Td-containing vaccines should begin or complete a primary immunization series including a Tdap dose. Adults should receive a Td booster every 10 years.  Varicella vaccine. An adult without evidence of immunity to varicella should receive 2 doses or a second dose if he has previously received 1 dose.  Human papillomavirus (HPV) vaccine. Males aged 68-21 years who have not received the vaccine previously should receive the 3-dose series. Males aged 22-26 years may be immunized. Immunization is recommended through the age of 6 years for any male who has sex with males and did not get any or all doses earlier. Immunization is recommended for any person with an immunocompromised condition through the age of 49 years if he did not get any or all doses earlier. During the 3-dose series, the second dose should be obtained 4-8 weeks after the first  dose. The third dose should be obtained 24 weeks after the first dose and 16 weeks after the second dose.  Zoster vaccine. One dose is recommended for adults aged 50 years or older unless certain conditions are present.  Measles, mumps, and rubella (MMR) vaccine. Adults born before [birth year] generally are considered immune to measles and mumps. Adults born in [birth year] or later should have 1 or more doses of MMR vaccine unless there is a contraindication to the vaccine or there is laboratory evidence of immunity to each of the three diseases. A routine second dose of MMR vaccine should be obtained at least 28 days after the first dose for students attending postsecondary  schools, health care workers, or international travelers. People who received inactivated measles vaccine or an unknown type of measles vaccine during 1963-1967 should receive 2 doses of MMR vaccine. People who received inactivated mumps vaccine or an unknown type of mumps vaccine before 1979 and are at high risk for mumps infection should consider immunization with 2 doses of MMR vaccine. Unvaccinated health care workers born before 1957 who lack laboratory evidence of measles, mumps, or rubella immunity or laboratory confirmation of disease should consider measles and mumps immunization with 2 doses of MMR vaccine or rubella immunization with 1 dose of MMR vaccine.  Pneumococcal 13-valent conjugate (PCV13) vaccine. When indicated, a person who is uncertain of his immunization history and has no record of immunization should receive the PCV13 vaccine. An adult aged 19 years or older who has certain medical conditions and has not been previously immunized should receive 1 dose of PCV13 vaccine. This PCV13 should be followed with a dose of pneumococcal polysaccharide (PPSV23) vaccine. The PPSV23 vaccine dose should be obtained at least 8 weeks after the dose of PCV13 vaccine. An adult aged 19 years or older who has certain medical conditions and  previously received 1 or more doses of PPSV23 vaccine should receive 1 dose of PCV13. The PCV13 vaccine dose should be obtained 1 or more years after the last PPSV23 vaccine dose.  Pneumococcal polysaccharide (PPSV23) vaccine. When PCV13 is also indicated, PCV13 should be obtained first. All adults aged 65 years and older should be immunized. An adult younger than age 65 years who has certain medical conditions should be immunized. Any person who resides in a nursing home or long-term care facility should be immunized. An adult smoker should be immunized. People with an immunocompromised condition and certain other conditions should receive both PCV13 and PPSV23 vaccines. People with human immunodeficiency virus (HIV) infection should be immunized as soon as possible after diagnosis. Immunization during chemotherapy or radiation therapy should be avoided. Routine use of PPSV23 vaccine is not recommended for American Indians, [place] Natives, or people younger than 65 years unless there are medical conditions that require PPSV23 vaccine. When indicated, people who have unknown immunization and have no record of immunization should receive PPSV23 vaccine. One-time revaccination 5 years after the first dose of PPSV23 is recommended for people aged 19-64 years who have chronic kidney failure, nephrotic syndrome, asplenia, or immunocompromised conditions. People who received 1-2 doses of PPSV23 before age 65 years should receive another dose of PPSV23 vaccine at age 65 years or later if at least 5 years have passed since the previous dose. Doses of PPSV23 are not needed for people immunized with PPSV23 at or after age 65 years.  Meningococcal vaccine. Adults with asplenia or persistent complement component deficiencies should receive 2 doses of quadrivalent meningococcal conjugate (MenACWY-D) vaccine. The doses should be obtained at least 2 months apart. Microbiologists working with certain meningococcal bacteria,  military recruits, people at risk during an outbreak, and people who travel to or live in countries with a high rate of meningitis should be immunized. A first-year college student up through age 21 years who is living in a residence hall should receive a dose if he did not receive a dose on or after his 16th birthday. Adults who have certain high-risk conditions should receive one or more doses of vaccine.  Hepatitis A vaccine. Adults who wish to be protected from this disease, have certain high-risk conditions, work with hepatitis A-infected animals, work in hepatitis A research labs, or   travel to or work in countries with a high rate of hepatitis A should be immunized. Adults who were previously unvaccinated and who anticipate close contact with an international adoptee during the first 60 days after arrival in the [place] from a country with a high rate of hepatitis A should be immunized.  Hepatitis B vaccine. Adults should be immunized if they wish to be protected from this disease, have certain high-risk conditions, may be exposed to blood or other infectious body fluids, are household contacts or sex partners of hepatitis B positive people, are clients or workers in certain care facilities, or travel to or work in countries with a high rate of hepatitis B.  Haemophilus influenzae type b (Hib) vaccine. A previously unvaccinated person with asplenia or sickle cell disease or having a scheduled splenectomy should receive 1 dose of Hib vaccine. Regardless of previous immunization, a recipient of a hematopoietic stem cell transplant should receive a 3-dose series 6-12 months after his successful transplant. Hib vaccine is not recommended for adults with HIV infection. Preventive Service / Frequency Ages 36 to 54  Blood pressure check.** / Every 1 to 2 years.  Lipid and cholesterol check.** / Every 5 years beginning at age 36.  Hepatitis C blood test.** / For any individual with known risks for  hepatitis C.  Skin self-exam. / Monthly.  Influenza vaccine. / Every year.  Tetanus, diphtheria, and acellular pertussis (Tdap, Td) vaccine.** / Consult your health care provider. 1 dose of Td every 10 years.  Varicella vaccine.** / Consult your health care provider.  HPV vaccine. / 3 doses over 6 months, if 48 or younger.  Measles, mumps, rubella (MMR) vaccine.** / You need at least 1 dose of MMR if you were born in 1957 or later. You may also need a second dose.  Pneumococcal 13-valent conjugate (PCV13) vaccine.** / Consult your health care provider.  Pneumococcal polysaccharide (PPSV23) vaccine.** / 1 to 2 doses if you smoke cigarettes or if you have certain conditions.  Meningococcal vaccine.** / 1 dose if you are age 60 to 59 years and a Market researcher living in a residence hall, or have one of several medical conditions. You may also need additional booster doses.  Hepatitis A vaccine.** / Consult your health care provider.  Hepatitis B vaccine.** / Consult your health care provider.  Haemophilus influenzae type b (Hib) vaccine.** / Consult your health care provider.  Health Maintenance A healthy lifestyle and preventative care can promote health and wellness. Maintain regular health, dental, and eye exams. Eat a healthy diet. Foods like vegetables, fruits, whole grains, low-fat dairy products, and lean protein foods contain the nutrients you need and are low in calories. Decrease your intake of foods high in solid fats, added sugars, and salt. Get information about a proper diet from your health care provider, if necessary. Regular physical exercise is one of the most important things you can do for your health. Most adults should get at least 150 minutes of moderate-intensity exercise (any activity that increases your heart rate and causes you to sweat) each week. In addition, most adults need muscle-strengthening exercises on 2 or more days a week.  Maintain a  healthy weight. The body mass index (BMI) is a screening tool to identify possible weight problems. It provides an estimate of body fat based on height and weight. Your health care provider can find your BMI and can help you achieve or maintain a healthy weight. For males 20 years and older:  A BMI below 18.5 is considered underweight. A BMI of 18.5 to 24.9 is normal. A BMI of 25 to 29.9 is considered overweight. A BMI of 30 and above is considered obese. Maintain normal blood lipids and cholesterol by exercising and minimizing your intake of saturated fat. Eat a balanced diet with plenty of fruits and vegetables. Blood tests for lipids and cholesterol should begin at age 3 and be repeated every 5 years. If your lipid or cholesterol levels are high, you are over age 23, or you are at high risk for heart disease, you may need your cholesterol levels checked more frequently.Ongoing high lipid and cholesterol levels should be treated with medicines if diet and exercise are not working. If you smoke, find out from your health care provider how to quit. If you do not use tobacco, do not start. Lung cancer screening is recommended for adults aged 18-80 years who are at high risk for developing lung cancer because of a history of smoking. A yearly low-dose CT scan of the lungs is recommended for people who have at least a 30-pack-year history of smoking and are current smokers or have quit within the past 15 years. A pack year of smoking is smoking an average of 1 pack of cigarettes a day for 1 year (for example, a 30-pack-year history of smoking could mean smoking 1 pack a day for 30 years or 2 packs a day for 15 years). Yearly screening should continue until the smoker has stopped smoking for at least 15 years. Yearly screening should be stopped for people who develop a health problem that would prevent them from having lung cancer treatment. If you choose to drink alcohol, do not have more than 2 drinks per  day. One drink is considered to be 12 oz (360 mL) of beer, 5 oz (150 mL) of wine, or 1.5 oz (45 mL) of liquor. Avoid the use of street drugs. Do not share needles with anyone. Ask for help if you need support or instructions about stopping the use of drugs. High blood pressure causes heart disease and increases the risk of stroke. Blood pressure should be checked at least every 1-2 years. Ongoing high blood pressure should be treated with medicines if weight loss and exercise are not effective. If you are 71-55 years old, ask your health care provider if you should take aspirin to prevent heart disease. Diabetes screening involves taking a blood sample to check your fasting blood sugar level. This should be done once every 3 years after age 67 if you are at a normal weight and without risk factors for diabetes. Testing should be considered at a younger age or be carried out more frequently if you are overweight and have at least 1 risk factor for diabetes. Colorectal cancer can be detected and often prevented. Most routine colorectal cancer screening begins at the age of 54 and continues through age 100. However, your health care provider may recommend screening at an earlier age if you have risk factors for colon cancer. On a yearly basis, your health care provider may provide home test kits to check for hidden blood in the stool. A small camera at the end of a tube may be used to directly examine the colon (sigmoidoscopy or colonoscopy) to detect the earliest forms of colorectal cancer. Talk to your health care provider about this at age 78 when routine screening begins. A direct exam of the colon should be repeated every 5-10 years through age 41, unless early  forms of precancerous polyps or small growths are found. People who are at an increased risk for hepatitis B should be screened for this virus. You are considered at high risk for hepatitis B if: You were born in a country where hepatitis B occurs  often. Talk with your health care provider about which countries are considered high risk. Your parents were born in a high-risk country and you have not received a shot to protect against hepatitis B (hepatitis B vaccine). You have HIV or AIDS. You use needles to inject street drugs. You live with, or have sex with, someone who has hepatitis B. You are a man who has sex with other men (MSM). You get hemodialysis treatment. You take certain medicines for conditions like cancer, organ transplantation, and autoimmune conditions. Hepatitis C blood testing is recommended for all people born from [birth year] through 1965 and any individual with known risk factors for hepatitis C. Healthy men should no longer receive prostate-specific antigen (PSA) blood tests as part of routine cancer screening. Talk to your health care provider about prostate cancer screening. Testicular cancer screening is not recommended for adolescents or adult males who have no symptoms. Screening includes self-exam, a health care provider exam, and other screening tests. Consult with your health care provider about any symptoms you have or any concerns you have about testicular cancer. Practice safe sex. Use condoms and avoid high-risk sexual practices to reduce the spread of sexually transmitted infections (STIs). You should be screened for STIs, including gonorrhea and chlamydia if: You are sexually active and are younger than 24 years. You are older than 24 years, and your health care provider tells you that you are at risk for this type of infection. Your sexual activity has changed since you were last screened, and you are at an increased risk for chlamydia or gonorrhea. Ask your health care provider if you are at risk. If you are at risk of being infected with HIV, it is recommended that you take a prescription medicine daily to prevent HIV infection. This is called pre-exposure prophylaxis (PrEP). You are considered at risk if: You  are a man who has sex with other men (MSM). You are a heterosexual man who is sexually active with multiple partners. You take drugs by injection. You are sexually active with a partner who has HIV. Talk with your health care provider about whether you are at high risk of being infected with HIV. If you choose to begin PrEP, you should first be tested for HIV. You should then be tested every 3 months for as long as you are taking PrEP. Use sunscreen. Apply sunscreen liberally and repeatedly throughout the day. You should seek shade when your shadow is shorter than you. Protect yourself by wearing long sleeves, pants, a wide-brimmed hat, and sunglasses year round whenever you are outdoors. Tell your health care provider of new moles or changes in moles, especially if there is a change in shape or color. Also, tell your health care provider if a mole is larger than the size of a pencil eraser. Stay current with your vaccines (immunizations).

## 2013-09-02 NOTE — Progress Notes (Signed)
Patient ID: Shane Nguyen, male   DOB: [birth year], 33 y.o.   MRN: [id number]   Annual Screening Comprehensive Examination  This very nice 33 y.o.MWM presents for complete physical.  Patient has been followed for HTN, Hyperlipidemia, and Vitamin D Deficiency. Also c/o long standing pain of the rt shoulder.   HTN predates since 2011. Patient's BP has been controlled at home.Today's BP: 118/70 mmHg. Patient denies any cardiac symptoms as chest pain, palpitations, shortness of breath, dizziness or ankle swelling.   Patient's hyperlipidemia is controlled with diet and supplements. Patient denies myalgias or other medication SE's. Last lipids were at goal in Apr 2015 as below. Lab Results  Component Value Date   CHOL 156 05/27/2013   HDL 37* 05/27/2013   LDLCALC 102* 05/27/2013   TRIG 84 05/27/2013   CHOLHDL 4.2 05/27/2013    Finally, patient has history of Vitamin D Deficiency of 49 in 2011 and last vitamin D was 55 in Apr 2015.   Medication List   ALPRAZolam 1 MG tablet  Commonly known as:  XANAX  Take 1 tablet (1 mg total) by mouth 3 (three) times daily as needed for anxiety.     bisoprolol-hydrochlorothiazide 5-6.25 MG per tablet  Commonly known as:  ZIAC  TAKE ONE TABLET BY MOUTH ONCE DAILY for  BP     cholecalciferol 1000 UNITS tablet  Commonly known as:  VITAMIN D  Take 1,000 Units by mouth daily.     Fish Oil 1000 MG Cpdr  Take by mouth.     Flaxseed Oil 1000 MG Caps  Take by mouth.     loratadine 10 MG tablet  Commonly known as:  CLARITIN  Take 10 mg by mouth daily.     Melatonin 1 MG Caps  Take by mouth.     No Known Allergies  Past Medical History  Diagnosis Date  . Hypertension   . Hemorrhoids   . Hyperlipidemia   . Vitamin D deficiency   . Insomnia    Past Surgical History  Procedure Laterality Date  . Anterior cruciate ligament repair      bilateral  . Knee arthroscopy Right 1997    murphy   Family History  Problem Relation Age of Onset  .  Hypertension Father   . Hyperlipidemia Father    History   Social History  . Marital Status: Married    Spouse Name: N/A    Number of Children: N/A  . Years of Education: N/A   Occupational History  . Truck [employer] doctor for [employer]   Social History Main Topics  . Smoking status: Former Smoker -- 1.00 packs/day for 10 years    Quit date: 02/11/2004  . Smokeless tobacco: Never Used  . Alcohol Use: Yes     Comment: 1/2 of a 5th q weekend  . Drug Use: No  . Sexual Activity: Active    ROS Constitutional: Denies fever, chills, weight loss/gain, headaches, insomnia, fatigue, night sweats or change in appetite. Eyes: Denies redness, blurred vision, diplopia, discharge, itchy or watery eyes.  ENT: Denies discharge, congestion, post nasal drip, epistaxis, sore throat, earache, hearing loss, dental pain, Tinnitus, Vertigo, Sinus pain or snoring.  Cardio: Denies chest pain, palpitations, irregular heartbeat, syncope, dyspnea, diaphoresis, orthopnea, PND, claudication or edema Respiratory: denies cough, dyspnea, DOE, pleurisy, hoarseness, laryngitis or wheezing.  Gastrointestinal: Denies dysphagia, heartburn, reflux, water brash, pain, cramps, nausea, vomiting, bloating, diarrhea, constipation, hematemesis, melena, hematochezia, jaundice or hemorrhoids Genitourinary: Denies dysuria, frequency, urgency, nocturia, hesitancy, discharge,  hematuria or flank pain Musculoskeletal: Denies arthralgia, myalgia, Jt. Swelling, limp or strain/sprain. Denies Falls. C/o stiffness and pain limiting the Rt shoulder. Skin: Denies puritis, rash, hives, warts, acne, eczema or change in skin lesion Neuro: No weakness, tremor, incoordination, spasms, paresthesia or pain Psychiatric: Denies confusion, memory loss or sensory loss. Denies Depression. Endocrine: Denies change in weight, skin, hair change, nocturia, and paresthesia, diabetic polys, visual blurring or hyper / hypo glycemic episodes.  Heme/Lymph: No  excessive bleeding, bruising or enlarged lymph nodes.  Physical Exam  BP 118/70  Pulse 58  Temp(Src) 98.2 F (36.8 C) (Temporal)  Resp 18  Ht 6' (1.829 m)  Wt 228 lb (103.42 kg)  BMI 30.92 kg/m2  General Appearance: Well nourished, in no apparent distress. Eyes: PERRLA, EOMs, conjunctiva no swelling or erythema, normal fundi and vessels. Sinuses: No frontal/maxillary tenderness ENT/Mouth: EACs patent / TMs  nl. Nares clear without erythema, swelling, mucoid exudates. Oral hygiene is good. No erythema, swelling, or exudate. Tongue normal, non-obstructing. Tonsils not swollen or erythematous. Hearing normal.  Neck: Supple, thyroid normal. No bruits, nodes or JVD. Respiratory: Respiratory effort normal.  BS equal and clear bilateral without rales, rhonci, wheezing or stridor. Cardio: Heart sounds are normal with regular rate and rhythm and no murmurs, rubs or gallops. Peripheral pulses are normal and equal bilaterally without edema. No aortic or femoral bruits. Chest: symmetric with normal excursions and percussion.  Abdomen: Flat, soft, with bowl sounds. Nontender, no guarding, rebound, hernias, masses, or organomegaly.  Lymphatics: Non tender without lymphadenopathy.  Genitourinary: No hernias.Testes nl. DRE - prostate nl for age - smooth & firm w/o nodules. Musculoskeletal: Full ROM all peripheral extremities, joint stability, 5/5 strength except decreased internal/external rotation of the rt shoulder and tender along the anterior joint line. Normal gait. Skin: Warm and dry without rashes, lesions, cyanosis, clubbing or  ecchymosis.  Neuro: Cranial nerves intact, reflexes equal bilaterally. Normal muscle tone, no cerebellar symptoms. Sensation intact.  Pysch: Awake and oriented X 3  with normal affect, insight and judgment appropriate.  Assessment and Plan  1. Annual Screening Examination 2. Hypertension  3. Hyperlipidemia 4. Vitamin D Deficiency 5. Capsulitis Rt Shoulder - Try  prednisone pulse/taper and if no improvement - then ortho referral  Continue prudent diet as discussed, weight control, BP monitoring, regular exercise, and medications as discussed.  Discussed med effects and SE's. Routine screening labs and tests as requested with regular follow-up as recommended.

## 2013-09-03 LAB — BASIC METABOLIC PANEL WITH GFR
BUN: 12 mg/dL (ref 6–23)
CALCIUM: 9.6 mg/dL (ref 8.4–10.5)
CO2: 27 meq/L (ref 19–32)
CREATININE: 0.86 mg/dL (ref 0.50–1.35)
Chloride: 101 mEq/L (ref 96–112)
GFR, Est African American: >89 mL/min
GFR, Est Non African American: >89 mL/min
Glucose, Bld: 99 mg/dL (ref 70–99)
Potassium: 3.9 mEq/L (ref 3.5–5.3)
Sodium: 139 mEq/L (ref 135–145)

## 2013-09-03 LAB — MICROALBUMIN / CREATININE URINE RATIO
Creatinine, Urine: 186.2 mg/dL
Microalb Creat Ratio: 5.3 mg/g (ref 0.0–30.0)
Microalb, Ur: 0.98 mg/dL (ref 0.00–1.89)

## 2013-09-03 LAB — TESTOSTERONE: Testosterone: 611 ng/dL (ref 300–890)

## 2013-09-03 LAB — LIPID PANEL
Cholesterol: 141 mg/dL (ref 0–200)
HDL: 38 mg/dL — AB (ref >39)
LDL Cholesterol: 90 mg/dL (ref 0–99)
Total CHOL/HDL Ratio: 3.7 Ratio
Triglycerides: 64 mg/dL (ref <150)
VLDL: 13 mg/dL (ref 0–40)

## 2013-09-03 LAB — HIV ANTIBODY (ROUTINE TESTING W REFLEX): HIV 1&2 Ab, 4th Generation: NONREACTIVE

## 2013-09-03 LAB — HEPATIC FUNCTION PANEL
ALBUMIN: 4.5 g/dL (ref 3.5–5.2)
ALT: 11 U/L (ref 0–53)
AST: 20 U/L (ref 0–37)
Alkaline Phosphatase: 45 U/L (ref 39–117)
BILIRUBIN TOTAL: 0.6 mg/dL (ref 0.2–1.2)
Bilirubin, Direct: 0.1 mg/dL (ref 0.0–0.3)
Indirect Bilirubin: 0.5 mg/dL (ref 0.2–1.2)
TOTAL PROTEIN: 7.1 g/dL (ref 6.0–8.3)

## 2013-09-03 LAB — RPR

## 2013-09-03 LAB — URINALYSIS, MICROSCOPIC ONLY
BACTERIA UA: NONE SEEN
CASTS: NONE SEEN
CRYSTALS: NONE SEEN
Squamous Epithelial / LPF: NONE SEEN

## 2013-09-03 LAB — INSULIN, FASTING: INSULIN FASTING, SERUM: 19 u[IU]/mL (ref 3–28)

## 2013-09-03 LAB — HEPATITIS B CORE ANTIBODY, TOTAL: Hep B Core Total Ab: NONREACTIVE

## 2013-09-03 LAB — HEPATITIS A ANTIBODY, TOTAL: Hep A Total Ab: NONREACTIVE

## 2013-09-03 LAB — HEPATITIS B SURFACE ANTIBODY,QUALITATIVE: Hep B S Ab: NEGATIVE

## 2013-09-03 LAB — TSH: TSH: 1.056 u[IU]/mL (ref 0.350–4.500)

## 2013-09-03 LAB — VITAMIN D 25 HYDROXY (VIT D DEFICIENCY, FRACTURES): VIT D 25 HYDROXY: 66 ng/mL (ref 30–89)

## 2013-09-03 LAB — MAGNESIUM: Magnesium: 1.7 mg/dL (ref 1.5–2.5)

## 2013-09-03 LAB — VITAMIN B12: Vitamin B-12: 498 pg/mL (ref 211–911)

## 2013-09-03 LAB — HEPATITIS C ANTIBODY: HCV AB: NEGATIVE

## 2013-09-05 LAB — HEPATITIS B E ANTIBODY: HEPATITIS BE ANTIBODY: NONREACTIVE

## 2013-09-07 LAB — TB SKIN TEST
Induration: 0 mm
TB Skin Test: NEGATIVE

## 2013-11-03 ENCOUNTER — Other Ambulatory Visit: Payer: Self-pay

## 2013-11-03 ENCOUNTER — Other Ambulatory Visit: Payer: Self-pay | Admitting: Physician Assistant

## 2013-11-03 DIAGNOSIS — M25511 Pain in right shoulder: Secondary | ICD-10-CM

## 2013-11-03 MED ORDER — PREDNISONE 20 MG PO TABS: ORAL_TABLET | ORAL | Status: DC

## 2014-01-19 ENCOUNTER — Encounter: Payer: Self-pay | Admitting: Internal Medicine

## 2014-02-24 ENCOUNTER — Ambulatory Visit (INDEPENDENT_AMBULATORY_CARE_PROVIDER_SITE_OTHER): Payer: BLUE CROSS/BLUE SHIELD | Admitting: Internal Medicine

## 2014-02-24 ENCOUNTER — Encounter: Payer: Self-pay | Admitting: Internal Medicine

## 2014-02-24 VITALS — BP 124/76 | HR 52 | Temp 98.2°F | Resp 16 | Ht 72.0 in | Wt 222.8 lb

## 2014-02-24 DIAGNOSIS — I10 Essential (primary) hypertension: Secondary | ICD-10-CM

## 2014-02-24 DIAGNOSIS — E559 Vitamin D deficiency, unspecified: Secondary | ICD-10-CM

## 2014-02-24 DIAGNOSIS — R7309 Other abnormal glucose: Secondary | ICD-10-CM

## 2014-02-24 DIAGNOSIS — E785 Hyperlipidemia, unspecified: Secondary | ICD-10-CM

## 2014-02-24 DIAGNOSIS — Z79899 Other long term (current) drug therapy: Secondary | ICD-10-CM

## 2014-02-24 LAB — CBC WITH DIFFERENTIAL/PLATELET
BASOS PCT: 0 % (ref 0–1)
Basophils Absolute: 0 10*3/uL (ref 0.0–0.1)
Eosinophils Absolute: 0.1 10*3/uL (ref 0.0–0.7)
Eosinophils Relative: 3 % (ref 0–5)
HCT: 44.9 % (ref 39.0–52.0)
Hemoglobin: 15.2 g/dL (ref 13.0–17.0)
LYMPHS ABS: 1.2 10*3/uL (ref 0.7–4.0)
LYMPHS PCT: 26 % (ref 12–46)
MCH: 29.1 pg (ref 26.0–34.0)
MCHC: 33.9 g/dL (ref 30.0–36.0)
MCV: 85.9 fL (ref 78.0–100.0)
MPV: 10.1 fL (ref 8.6–12.4)
Monocytes Absolute: 0.5 10*3/uL (ref 0.1–1.0)
Monocytes Relative: 11 % (ref 3–12)
NEUTROS ABS: 2.8 10*3/uL (ref 1.7–7.7)
Neutrophils Relative %: 60 % (ref 43–77)
Platelets: 210 10*3/uL (ref 150–400)
RBC: 5.23 MIL/uL (ref 4.22–5.81)
RDW: 12.9 % (ref 11.5–15.5)
WBC: 4.7 10*3/uL (ref 4.0–10.5)

## 2014-02-24 LAB — HEMOGLOBIN A1C
Hgb A1c MFr Bld: 5.5 % (ref <5.7)
Mean Plasma Glucose: 111 mg/dL (ref <117)

## 2014-02-24 MED ORDER — PREDNISONE 20 MG PO TABS: ORAL_TABLET | ORAL | Status: DC

## 2014-02-24 MED ORDER — AZITHROMYCIN 250 MG PO TABS: ORAL_TABLET | ORAL | Status: AC

## 2014-02-24 MED ORDER — DICLOFENAC POTASSIUM(MIGRAINE) 50 MG PO PACK: PACK | ORAL | Status: DC

## 2014-02-24 MED ORDER — MELOXICAM 15 MG PO TABS: ORAL_TABLET | ORAL | Status: AC

## 2014-02-24 MED ORDER — HYDROCODONE-ACETAMINOPHEN 5-325 MG PO TABS: ORAL_TABLET | ORAL | Status: AC

## 2014-02-24 NOTE — Patient Instructions (Signed)

## 2014-02-25 LAB — INSULIN, FASTING: Insulin fasting, serum: 9.8 u[IU]/mL (ref 2.0–19.6)

## 2014-02-25 LAB — HEPATIC FUNCTION PANEL
ALBUMIN: 4.1 g/dL (ref 3.5–5.2)
ALK PHOS: 48 U/L (ref 39–117)
ALT: 14 U/L (ref 0–53)
AST: 20 U/L (ref 0–37)
Bilirubin, Direct: 0.1 mg/dL (ref 0.0–0.3)
Indirect Bilirubin: 0.4 mg/dL (ref 0.2–1.2)
Total Bilirubin: 0.5 mg/dL (ref 0.2–1.2)
Total Protein: 6.8 g/dL (ref 6.0–8.3)

## 2014-02-25 LAB — MAGNESIUM: MAGNESIUM: 1.9 mg/dL (ref 1.5–2.5)

## 2014-02-25 LAB — LIPID PANEL
Cholesterol: 141 mg/dL (ref 0–200)
HDL: 37 mg/dL — AB (ref >39)
LDL Cholesterol: 92 mg/dL (ref 0–99)
Total CHOL/HDL Ratio: 3.8 Ratio
Triglycerides: 60 mg/dL (ref <150)
VLDL: 12 mg/dL (ref 0–40)

## 2014-02-25 LAB — BASIC METABOLIC PANEL WITH GFR
BUN: 15 mg/dL (ref 6–23)
CALCIUM: 9.6 mg/dL (ref 8.4–10.5)
CHLORIDE: 103 meq/L (ref 96–112)
CO2: 28 mEq/L (ref 19–32)
Creat: 0.76 mg/dL (ref 0.50–1.35)
GFR, Est African American: >89 mL/min
GFR, Est Non African American: >89 mL/min
Glucose, Bld: 59 mg/dL — ABNORMAL LOW (ref 70–99)
Potassium: 4.4 mEq/L (ref 3.5–5.3)
SODIUM: 140 meq/L (ref 135–145)

## 2014-02-25 LAB — TSH: TSH: 0.561 u[IU]/mL (ref 0.350–4.500)

## 2014-02-25 LAB — VITAMIN D 25 HYDROXY (VIT D DEFICIENCY, FRACTURES): VIT D 25 HYDROXY: 49 ng/mL (ref 30–100)

## 2014-02-26 NOTE — Progress Notes (Signed)
Patient ID: Shane Nguyen, male   DOB: 09/04/1980, 34 y.o.   MRN: [id number]   This very nice 34 y.o. MWM presents for 3 month follow up with Hypertension, Hyperlipidemia, Pre-Diabetes and Vitamin D Deficiency. Today patient also has c/o head/chest congestion w/ purulent nasal secretions and sputum for > 1 week.    Patient is treated for HTN since Oct 2011 & BP has been controlled at home. Today's BP: 124/76 mmHg. Patient has had no complaints of any cardiac type chest pain, palpitations, dyspnea/orthopnea/PND, dizziness, claudication, or dependent edema.   Hyperlipidemia is controlled with diet & supplements. Patient denies myalgias or other med SE's. Last Lipids were at goal Total Chol  141; HDL 37; LDL 92; Trig 60 on 02/24/2014.   Also, the patient has history of Morbid Obesity (BMI 30.21) and is screened for PreDiabetes and has had no symptoms of reactive hypoglycemia, diabetic polys, paresthesias or visual blurring.  Last A1c was 5.5% on 02/24/2014.   Further, the patient also has history of Vitamin D Deficiency or 49 in 2011 and supplements vitamin D without any suspected side-effects. Last vitamin D was  49 on 02/24/2014.  Medication Sig  . ALPRAZolam (XANAX) 1 MG tablet Take 1 tablet 3  times daily as needed for anxiety.  . bisoprolol-hctz 5-6.25  TAKE ONE TAB ONCE DAILY for  BP  . VITAMIN D 1000 UNITS  Take 1,000 Units by mouth daily.  Marland Kitchen. FLAXSEED OIL 1000 MG  Take by mouth.  . Loratadine 10 MG tablet Take 10 mg by mouth daily.  . Melatonin 1 MG CAPS Take by mouth.  Marland Kitchen. FISH OIL 1000 MG  Take by mouth.   No Known Allergies  PMHx:   Past Medical History  Diagnosis Date  . Hypertension   . Hemorrhoids   . Hyperlipidemia   . Vitamin D deficiency   . Insomnia    Immunization History  Administered Date(s) Administered  . PPD Test 09/02/2013  . Pneumococcal-Unspecified 11/21/2009  . Tdap 11/21/2009   Past Surgical History  Procedure Laterality Date  . Anterior cruciate ligament  repair      bilateral  . Knee arthroscopy Right 1997    murphy   FHx:    Reviewed / unchanged  SHx:    Reviewed / unchanged  Systems Review:  Constitutional: Denies fever, chills, wt changes, headaches, insomnia, fatigue, night sweats, change in appetite. Eyes: Denies redness, blurred vision, diplopia, discharge, itchy, watery eyes.  ENT: Denies discharge, congestion, post nasal drip, epistaxis, sore throat, earache, hearing loss, dental pain, tinnitus, vertigo, sinus pain, snoring.  CV: Denies chest pain, palpitations, irregular heartbeat, syncope, dyspnea, diaphoresis, orthopnea, PND, claudication or edema. Respiratory: denies cough, dyspnea, DOE, pleurisy, hoarseness, laryngitis, wheezing.  Gastrointestinal: Denies dysphagia, odynophagia, heartburn, reflux, water brash, abdominal pain or cramps, nausea, vomiting, bloating, diarrhea, constipation, hematemesis, melena, hematochezia  or hemorrhoids. Genitourinary: Denies dysuria, frequency, urgency, nocturia, hesitancy, discharge, hematuria or flank pain. Musculoskeletal: Denies arthralgias, myalgias, stiffness, jt. swelling, pain, limping or strain/sprain.  Skin: Denies pruritus, rash, hives, warts, acne, eczema or change in skin lesion(s). Neuro: No weakness, tremor, incoordination, spasms, paresthesia or pain. Psychiatric: Denies confusion, memory loss or sensory loss. Endo: Denies change in weight, skin or hair change.  Heme/Lymph: No excessive bleeding, bruising or enlarged lymph nodes.  Physical Exam  BP 124/76   Pulse 52  Temp 98.2 F  Resp 16  Ht 6'   Wt 222 lb 12.8 oz    BMI 30.21   Appears well nourished  and in no distress. Eyes: PERRLA, EOMs, conjunctiva no swelling or erythema. Sinuses: No frontal/maxillary tenderness ENT/Mouth: EAC's clear, TM's nl w/o erythema, bulging. Nares clear w/o erythema, swelling, exudates. Oropharynx clear without erythema or exudates. Oral hygiene is good. Tongue normal, non obstructing.  Hearing intact.  Neck: Supple. Thyroid nl. Car 2+/2+ without bruits, nodes or JVD. Chest: Respirations nl with BS clear & equal w/o rales, rhonchi, wheezing or stridor.  Cor: Heart sounds normal w/ regular rate and rhythm without sig. murmurs, gallops, clicks, or rubs. Peripheral pulses normal and equal  without edema.  Abdomen: Soft & bowel sounds normal. Non-tender w/o guarding, rebound, hernias, masses, or organomegaly.  Lymphatics: Unremarkable.  Musculoskeletal: Full ROM all peripheral extremities, joint stability, 5/5 strength, and normal gait. Still having intermittent right shoulder pains aggravated by activities. Skin: Warm, dry without exposed rashes, lesions or ecchymosis apparent.  Neuro: Cranial nerves intact, reflexes equal bilaterally. Sensory-motor testing grossly intact. Tendon reflexes grossly intact.  Pysch: Alert & oriented x 3.  Insight and judgement nl & appropriate. No ideations.  Assessment and Plan:  1. Essential hypertension  - TSH  2. Hyperlipidemia  - Lipid panel  3. Vitamin D deficiency  - Hemoglobin A1c - Insulin, fasting  4. Abnormal glucose  - Vit D  25 hydroxy (rtn osteoporosis monitoring)  5. Medication management  - CBC with Differential - BASIC METABOLIC PANEL WITH GFR - Hepatic function panel - Magnesium  6. Sinobronchitis -   - ZPak, Prednisone taper  & Norco prn  7. Right Shoulder Pains -  - Rx Meloxicam prn   Recommended regular exercise, BP monitoring, weight control, and discussed med and SE's. Recommended labs to assess and monitor clinical status. Further disposition pending results of labs.  ROV 6 mo for CPE

## 2014-03-10 ENCOUNTER — Ambulatory Visit: Payer: Self-pay | Admitting: Internal Medicine

## 2014-05-18 ENCOUNTER — Other Ambulatory Visit: Payer: Self-pay | Admitting: Internal Medicine

## 2014-09-06 ENCOUNTER — Ambulatory Visit (INDEPENDENT_AMBULATORY_CARE_PROVIDER_SITE_OTHER): Payer: BLUE CROSS/BLUE SHIELD | Admitting: Internal Medicine

## 2014-09-06 ENCOUNTER — Encounter: Payer: Self-pay | Admitting: Internal Medicine

## 2014-09-06 VITALS — BP 110/78 | HR 52 | Temp 97.7°F | Resp 16 | Ht 72.75 in | Wt 209.4 lb

## 2014-09-06 DIAGNOSIS — R5383 Other fatigue: Secondary | ICD-10-CM

## 2014-09-06 DIAGNOSIS — Z0000 Encounter for general adult medical examination without abnormal findings: Secondary | ICD-10-CM

## 2014-09-06 DIAGNOSIS — R7309 Other abnormal glucose: Secondary | ICD-10-CM

## 2014-09-06 DIAGNOSIS — M25511 Pain in right shoulder: Secondary | ICD-10-CM

## 2014-09-06 DIAGNOSIS — Z6827 Body mass index (BMI) 27.0-27.9, adult: Secondary | ICD-10-CM

## 2014-09-06 DIAGNOSIS — E785 Hyperlipidemia, unspecified: Secondary | ICD-10-CM

## 2014-09-06 DIAGNOSIS — I10 Essential (primary) hypertension: Secondary | ICD-10-CM

## 2014-09-06 DIAGNOSIS — Z79899 Other long term (current) drug therapy: Secondary | ICD-10-CM

## 2014-09-06 DIAGNOSIS — Z111 Encounter for screening for respiratory tuberculosis: Secondary | ICD-10-CM

## 2014-09-06 DIAGNOSIS — E559 Vitamin D deficiency, unspecified: Secondary | ICD-10-CM

## 2014-09-06 DIAGNOSIS — Z1212 Encounter for screening for malignant neoplasm of rectum: Secondary | ICD-10-CM

## 2014-09-06 LAB — CBC WITH DIFFERENTIAL/PLATELET
BASOS ABS: 0 10*3/uL (ref 0.0–0.1)
BASOS PCT: 0 % (ref 0–1)
EOS PCT: 2 % (ref 0–5)
Eosinophils Absolute: 0.1 10*3/uL (ref 0.0–0.7)
HEMATOCRIT: 44.9 % (ref 39.0–52.0)
Hemoglobin: 15.1 g/dL (ref 13.0–17.0)
LYMPHS ABS: 1 10*3/uL (ref 0.7–4.0)
LYMPHS PCT: 30 % (ref 12–46)
MCH: 29 pg (ref 26.0–34.0)
MCHC: 33.6 g/dL (ref 30.0–36.0)
MCV: 86.3 fL (ref 78.0–100.0)
MPV: 10.1 fL (ref 8.6–12.4)
Monocytes Absolute: 0.4 10*3/uL (ref 0.1–1.0)
Monocytes Relative: 13 % — ABNORMAL HIGH (ref 3–12)
Neutro Abs: 1.9 10*3/uL (ref 1.7–7.7)
Neutrophils Relative %: 55 % (ref 43–77)
PLATELETS: 208 10*3/uL (ref 150–400)
RBC: 5.2 MIL/uL (ref 4.22–5.81)
RDW: 13.2 % (ref 11.5–15.5)
WBC: 3.4 10*3/uL — ABNORMAL LOW (ref 4.0–10.5)

## 2014-09-06 LAB — BASIC METABOLIC PANEL WITH GFR
BUN: 12 mg/dL (ref 7–25)
CO2: 29 meq/L (ref 20–31)
Calcium: 9.7 mg/dL (ref 8.6–10.3)
Chloride: 100 mEq/L (ref 98–110)
Creat: 0.82 mg/dL (ref 0.60–1.35)
GFR, Est African American: >89 mL/min (ref >=60)
GFR, Est Non African American: >89 mL/min (ref >=60)
Glucose, Bld: 78 mg/dL (ref 65–99)
Potassium: 4.2 mEq/L (ref 3.5–5.3)
Sodium: 136 mEq/L (ref 135–146)

## 2014-09-06 LAB — HEPATIC FUNCTION PANEL
ALK PHOS: 41 U/L (ref 40–115)
ALT: 14 U/L (ref 9–46)
AST: 23 U/L (ref 10–40)
Albumin: 4.5 g/dL (ref 3.6–5.1)
BILIRUBIN INDIRECT: 0.5 mg/dL (ref 0.2–1.2)
Bilirubin, Direct: 0.2 mg/dL (ref <=0.2)
Total Bilirubin: 0.7 mg/dL (ref 0.2–1.2)
Total Protein: 6.9 g/dL (ref 6.1–8.1)

## 2014-09-06 LAB — LIPID PANEL
CHOLESTEROL: 136 mg/dL (ref 125–200)
HDL: 40 mg/dL (ref >=40)
LDL Cholesterol: 86 mg/dL (ref <130)
TRIGLYCERIDES: 50 mg/dL (ref <150)
Total CHOL/HDL Ratio: 3.4 Ratio (ref <=5.0)
VLDL: 10 mg/dL (ref <30)

## 2014-09-06 LAB — MAGNESIUM: Magnesium: 1.9 mg/dL (ref 1.5–2.5)

## 2014-09-06 LAB — TSH: TSH: 0.793 u[IU]/mL (ref 0.350–4.500)

## 2014-09-06 LAB — IRON AND TIBC
%SAT: 31 % (ref 20–55)
IRON: 103 ug/dL (ref 42–165)
TIBC: 331 ug/dL (ref 215–435)
UIBC: 228 ug/dL (ref 125–400)

## 2014-09-06 LAB — VITAMIN B12: Vitamin B-12: 427 pg/mL (ref 211–911)

## 2014-09-06 MED ORDER — DEXAMETHASONE SODIUM PHOSPHATE 100 MG/10ML IJ SOLN
10.0000 mg | Freq: Once | INTRAMUSCULAR | Status: AC
  Administered 2014-09-06: 10 mg via INTRAMUSCULAR

## 2014-09-06 MED ORDER — LISINOPRIL 20 MG PO TABS: 20.0000 mg | ORAL_TABLET | Freq: Every day | ORAL | Status: DC

## 2014-09-06 NOTE — Patient Instructions (Signed)
Recommend Adult Low dose Aspirin or coated  Aspirin 81 mg daily   To reduce risk of Colon Cancer 20 %,   Skin Cancer 26 % ,   Melanoma 46%   and   Pancreatic cancer 60% ++++++++++++++++++ Vitamin D goal is between 70-100.   Please make sure that you are taking your Vitamin D as directed.   It is very important as a natural anti-inflammatory   helping hair, skin, and nails, as well as reducing stroke and heart attack risk.   It helps your bones and helps with mood.  It also decreases numerous cancer risks so please take it as directed.   Low Vit D is associated with a 200-300% higher risk for CANCER   and 200-300% higher risk for HEART   ATTACK  &  STROKE.   .....................................Marland Kitchen  It is also associated with higher death rate at younger ages,   autoimmune diseases like Rheumatoid arthritis, Lupus, Multiple Sclerosis.     Also many other serious conditions, like depression, Alzheimer's  Dementia, infertility, muscle aches, fatigue, fibromyalgia - just to name a few.  +++++++++++++++++++  Recommend the book "The END of DIETING" by Dr Excell Seltzer   & the book "The END of DIABETES " by Dr Excell Seltzer  At [email or web address] - get book & Audio CD's     Being diabetic has a  300% increased risk for heart attack, stroke, cancer, and alzheimer- type vascular dementia. It is very important that you work harder with diet by avoiding all foods that are white. Avoid white rice (brown & wild rice is OK), white potatoes (sweetpotatoes in moderation is OK), White bread or wheat bread or anything made out of white flour like bagels, donuts, rolls, buns, biscuits, cakes, pastries, cookies, pizza crust, and pasta (made from white flour & egg whites) - vegetarian pasta or spinach or wheat pasta is OK. Multigrain breads like Arnold's or Pepperidge [employer], or multigrain sandwich thins or flatbreads.  Diet, exercise and weight loss can reverse and cure diabetes in the early stages.   Diet, exercise and weight loss is very important in the control and prevention of complications of diabetes which affects every system in your body, ie. Brain - dementia/stroke, eyes - glaucoma/blindness, heart - heart attack/heart failure, kidneys - dialysis, stomach - gastric paralysis, intestines - malabsorption, nerves - severe painful neuritis, circulation - gangrene & loss of a leg(s), and finally cancer and Alzheimers.    I recommend avoid fried & greasy foods,  sweets/candy, white rice (brown or wild rice or Quinoa is OK), white potatoes (sweet potatoes are OK) - anything made from white flour - bagels, doughnuts, rolls, buns, biscuits,white and wheat breads, pizza crust and traditional pasta made of white flour & egg white(vegetarian pasta or spinach or wheat pasta is OK).  Multi-grain bread is OK - like multi-grain flat bread or sandwich thins. Avoid alcohol in excess. Exercise is also important.    Eat all the vegetables you want - avoid meat, especially red meat and dairy - especially cheese.  Cheese is the most concentrated form of trans-fats which is the worst thing to clog up our arteries. Veggie cheese is OK which can be found in the fresh produce section at [facility] or [employer] or [employer]  ++++++++++++++++++++++++++   Preventive Care for Adults  A healthy lifestyle and preventive care can promote health and wellness. Preventive health guidelines for men include the following key practices:  A routine yearly physical is a good way  to check with your health care provider about your health and preventative screening. It is a chance to share any concerns and updates on your health and to receive a thorough exam.  Visit your dentist for a routine exam and preventative care every 6 months. Brush your teeth twice a day and floss once a day. Good oral hygiene prevents tooth decay and gum disease.  The frequency of eye exams is based on your age, health, family medical history, use  of contact lenses, and other factors. Follow your health care provider's recommendations for frequency of eye exams.  Eat a healthy diet. Foods such as vegetables, fruits, whole grains, low-fat dairy products, and lean protein foods contain the nutrients you need without too many calories. Decrease your intake of foods high in solid fats, added sugars, and salt. Eat the right amount of calories for you.Get information about a proper diet from your health care provider, if necessary.  Regular physical exercise is one of the most important things you can do for your health. Most adults should get at least 150 minutes of moderate-intensity exercise (any activity that increases your heart rate and causes you to sweat) each week. In addition, most adults need muscle-strengthening exercises on 2 or more days a week.  Maintain a healthy weight. The body mass index (BMI) is a screening tool to identify possible weight problems. It provides an estimate of body fat based on height and weight. Your health care provider can find your BMI and can help you achieve or maintain a healthy weight.For adults 20 years and older:  A BMI below 18.5 is considered underweight.  A BMI of 18.5 to 24.9 is normal.  A BMI of 25 to 29.9 is considered overweight.  A BMI of 30 and above is considered obese.  Maintain normal blood lipids and cholesterol levels by exercising and minimizing your intake of saturated fat. Eat a balanced diet with plenty of fruit and vegetables. Blood tests for lipids and cholesterol should begin at age 32 and be repeated every 5 years. If your lipid or cholesterol levels are high, you are over 50, or you are at high risk for heart disease, you may need your cholesterol levels checked more frequently.Ongoing high lipid and cholesterol levels should be treated with medicines if diet and exercise are not working.  If you smoke, find out from your health care provider how to quit. If you do not use  tobacco, do not start.  Lung cancer screening is recommended for adults aged 74-80 years who are at high risk for developing lung cancer because of a history of smoking. A yearly low-dose CT scan of the lungs is recommended for people who have at least a 30-pack-year history of smoking and are a current smoker or have quit within the past 15 years. A pack year of smoking is smoking an average of 1 pack of cigarettes a day for 1 year (for example: 1 pack a day for 30 years or 2 packs a day for 15 years). Yearly screening should continue until the smoker has stopped smoking for at least 15 years. Yearly screening should be stopped for people who develop a health problem that would prevent them from having lung cancer treatment.  If you choose to drink alcohol, do not have more than 2 drinks per day. One drink is considered to be 12 ounces (355 mL) of beer, 5 ounces (148 mL) of wine, or 1.5 ounces (44 mL) of liquor.  High blood pressure  causes heart disease and increases the risk of stroke. Your blood pressure should be checked. Ongoing high blood pressure should be treated with medicines, if weight loss and exercise are not effective.  If you are 45-79 years old, ask your health care provider if you should take aspirin to prevent heart disease.  Diabetes screening involves taking a blood sample to check your fasting blood sugar level. Testing should be considered at a younger age or be carried out more frequently if you are overweight and have at least 1 risk factor for diabetes.  Colorectal cancer can be detected and often prevented. Most routine colorectal cancer screening begins at the age of 50 and continues through age 75. However, your health care provider may recommend screening at an earlier age if you have risk factors for colon cancer. On a yearly basis, your health care provider may provide home test kits to check for hidden blood in the stool. Use of a small camera at the end of a tube to  directly examine the colon (sigmoidoscopy or colonoscopy) can detect the earliest forms of colorectal cancer. Talk to your health care provider about this at age 50, when routine screening begins. Direct exam of the colon should be repeated every 5-10 years through age 75, unless early forms of precancerous polyps or small growths are found.  Screening for abdominal aortic aneurysm (AAA)  are recommended for persons over age 50 who have history of hypertensionor who are current or former smokers.  Talk with your health care provider about prostate cancer screening.  Testicular cancer screening is recommended for adult males. Screening includes self-exam, a health care provider exam, and other screening tests. Consult with your health care provider about any symptoms you have or any concerns you have about testicular cancer.  Use sunscreen. Apply sunscreen liberally and repeatedly throughout the day. You should seek shade when your shadow is shorter than you. Protect yourself by wearing long sleeves, pants, a wide-brimmed hat, and sunglasses year round, whenever you are outdoors.  Once a month, do a whole-body skin exam, using a mirror to look at the skin on your back. Tell your health care provider about new moles, moles that have irregular borders, moles that are larger than a pencil eraser, or moles that have changed in shape or color.  Stay current with required vaccines (immunizations).  Influenza vaccine. All adults should be immunized every year.  Tetanus, diphtheria, and acellular pertussis (Td, Tdap) vaccine. An adult who has not previously received Tdap or who does not know his vaccine status should receive 1 dose of Tdap. This initial dose should be followed by tetanus and diphtheria toxoids (Td) booster doses every 10 years. Adults with an unknown or incomplete history of completing a 3-dose immunization series with Td-containing vaccines should begin or complete a primary immunization  series including a Tdap dose. Adults should receive a Td booster every 10 years.  Zoster vaccine. One dose is recommended for adults aged 60 years or older unless certain conditions are present.    Pneumococcal 13-valent conjugate (PCV13) vaccine. When indicated, a person who is uncertain of his immunization history and has no record of immunization should receive the PCV13 vaccine. An adult aged 19 years or older who has certain medical conditions and has not been previously immunized should receive 1 dose of PCV13 vaccine. This PCV13 should be followed with a dose of pneumococcal polysaccharide (PPSV23) vaccine. The PPSV23 vaccine dose should be obtained at least 8 weeks after the dose   of PCV13 vaccine. An adult aged 19 years or older who has certain medical conditions and previously received 1 or more doses of PPSV23 vaccine should receive 1 dose of PCV13. The PCV13 vaccine dose should be obtained 1 or more years after the last PPSV23 vaccine dose.    Pneumococcal polysaccharide (PPSV23) vaccine. When PCV13 is also indicated, PCV13 should be obtained first. All adults aged 65 years and older should be immunized. An adult younger than age 65 years who has certain medical conditions should be immunized. Any person who resides in a nursing home or long-term care facility should be immunized. An adult smoker should be immunized. People with an immunocompromised condition and certain other conditions should receive both PCV13 and PPSV23 vaccines. People with human immunodeficiency virus (HIV) infection should be immunized as soon as possible after diagnosis. Immunization during chemotherapy or radiation therapy should be avoided. Routine use of PPSV23 vaccine is not recommended for American Indians, [place] Natives, or people younger than 65 years unless there are medical conditions that require PPSV23 vaccine. When indicated, people who have unknown immunization and have no record of immunization should  receive PPSV23 vaccine. One-time revaccination 5 years after the first dose of PPSV23 is recommended for people aged 19-64 years who have chronic kidney failure, nephrotic syndrome, asplenia, or immunocompromised conditions. People who received 1-2 doses of PPSV23 before age 65 years should receive another dose of PPSV23 vaccine at age 65 years or later if at least 5 years have passed since the previous dose. Doses of PPSV23 are not needed for people immunized with PPSV23 at or after age 65 years.  Hepatitis A vaccine. Adults who wish to be protected from this disease, have certain high-risk conditions, work with hepatitis A-infected animals, work in hepatitis A research labs, or travel to or work in countries with a high rate of hepatitis A should be immunized. Adults who were previously unvaccinated and who anticipate close contact with an international adoptee during the first 60 days after arrival in the [place] from a country with a high rate of hepatitis A should be immunized.  Hepatitis B vaccine. Adults should be immunized if they wish to be protected from this disease, have certain high-risk conditions, may be exposed to blood or other infectious body fluids, are household contacts or sex partners of hepatitis B positive people, are clients or workers in certain care facilities, or travel to or work in countries with a high rate of hepatitis B.  Preventive Service / Frequency  Ages 19 to 39  Blood pressure check.  Lipid and cholesterol check.  Hepatitis C blood test.** / For any individual with known risks for hepatitis C.  Skin self-exam. / Monthly.  Influenza vaccine. / Every year.  Tetanus, diphtheria, and acellular pertussis (Tdap, Td) vaccine.** / Consult your health care provider. 1 dose of Td every 10 years.  HPV vaccine. / 3 doses over 6 months, if 26 or younger.  Measles, mumps, rubella (MMR) vaccine.** / You need at least 1 dose of MMR if you were born in 1957 or  later. You may also need a second dose.  Pneumococcal 13-valent conjugate (PCV13) vaccine.** / Consult your health care provider.  Pneumococcal polysaccharide (PPSV23) vaccine.** / 1 to 2 doses if you smoke cigarettes or if you have certain conditions.  Meningococcal vaccine.** / 1 dose if you are age 19 to 21 years and a first-year college student living in a residence hall, or have one of several medical conditions. You   may also need additional booster doses.  Hepatitis A vaccine.** / Consult your health care provider.  Hepatitis B vaccine.** / Consult your health care provider.

## 2014-09-06 NOTE — Progress Notes (Signed)
Patient ID: Shane Nguyen, male   DOB: 1980-11-18, 34 y.o.   MRN: [id number]   Comprehensive Examination  This very nice 34 y.o.n MWM presents for complete physical.  Patient has been followed for HTN, Prediabetes, Hyperlipidemia, and Vitamin D Deficiency.   Labile HTN predates since Oct 2011. Patient's BP has been controlled at home.Today's BP: 110/78 mmHg. Patient denies any cardiac symptoms as chest pain, palpitations, shortness of breath, dizziness or ankle swelling.   Patient's hyperlipidemia is controlled with diet and medications. Patient denies myalgias or other medication SE's. Last lipids were at goal - Cholesterol 141; HDL 37; LDL 92; Trig 60 on 02/24/2014.     Patient has prediabetes since    and patient denies reactive hypoglycemic symptoms, visual blurring, diabetic polys or paresthesias. Last A1c was 5.5% on 02/24/2014.      Finally, patient has history of Vitamin D Deficiency of 49 on treatment in 2011 and last vitamin D was 49 on 02/24/2014.  Medication Sig  . ALPRAZolam (XANAX) 1 MG tablet Take 1 tablet (1 mg total) by mouth 3 (three) times daily as needed for anxiety.  . bisoprolol-hydrochlorothiazide (ZIAC) 5-6.25 MG per tablet TAKE ONE TABLET BY MOUTH ONCE DAILY FOR BLOOD PRESSURE  . FLAXSEED OIL 1000 MG CAPS Take by mouth.  . Melatonin 1 MG CAPS Take by mouth.  . meloxicam 15 MG tablet Take 1/2 to 1 tablet daily with food if needed for pain & inflammation  . VITAMIN D 1000 UNITS tablet Take 1,000 Units by mouth daily.  Marland Kitchen loratadine 10 MG tablet Take 10 mg by mouth daily.  Marland Kitchen FISH OIL 1000 MG CPDR Take by mouth.  . predniSONE  20 MG tablet 1 tab 3 x day for 3 days, then 1 tab 2 x day for 3 days, then 1 tab 1 x day for 5 days   No Known Allergies   Past Medical History  Diagnosis Date  . Hypertension   . Hemorrhoids   . Hyperlipidemia   . Vitamin D deficiency   . Insomnia    Health Maintenance  Topic Date Due  . INFLUENZA VACCINE  09/11/2014  . TETANUS/TDAP   11/22/2019  . HIV Screening  Completed   Immunization History  Administered Date(s) Administered  . PPD Test 09/02/2013  . Pneumococcal-Unspecified 11/21/2009  . Tdap 11/21/2009   Past Surgical History  Procedure Laterality Date  . Anterior cruciate ligament repair      bilateral  . Knee arthroscopy Right 1997    murphy   Family History  Problem Relation Age of Onset  . Hypertension Father   . Hyperlipidemia Father    History   Social History  . Marital Status: Married    Spouse Name: N/A  . Number of Children: N/A  . Years of Education: N/A   Occupational History  . Holiday representative - [employer]   Social History Main Topics  . Smoking status: Former Smoker -- 1.00 packs/day for 10 years    Quit date: 02/11/2004  . Smokeless tobacco: Never Used  . Alcohol Use: Yes     Comment: 1/2 of a 5th q weekend  . Drug Use: No  . Sexual Activity: SActive    ROS Constitutional: Denies fever, chills, weight loss/gain, headaches, insomnia,  night sweats or change in appetite. Does c/o fatigue. Eyes: Denies redness, blurred vision, diplopia, discharge, itchy or watery eyes.  ENT: Denies discharge, congestion, post nasal drip, epistaxis, sore throat, earache, hearing loss, dental pain, Tinnitus, Vertigo, Sinus pain or  snoring.  Cardio: Denies chest pain, palpitations, irregular heartbeat, syncope, dyspnea, diaphoresis, orthopnea, PND, claudication or edema Respiratory: denies cough, dyspnea, DOE, pleurisy, hoarseness, laryngitis or wheezing.  Gastrointestinal: Denies dysphagia, heartburn, reflux, water brash, pain, cramps, nausea, vomiting, bloating, diarrhea, constipation, hematemesis, melena, hematochezia, jaundice or hemorrhoids Genitourinary: Denies dysuria, frequency, urgency, nocturia, hesitancy, discharge, hematuria or flank pain Musculoskeletal: Denies arthralgia, myalgia, stiffness, Jt. Swelling, pain, limp or strain/sprain. Denies Falls. Skin: Denies puritis, rash, hives,  warts, acne, eczema or change in skin lesion Neuro: No weakness, tremor, incoordination, spasms, paresthesia or pain Psychiatric: Denies confusion, memory loss or sensory loss. Denies Depression. Endocrine: Denies change in weight, skin, hair change, nocturia, and paresthesia, diabetic polys, visual blurring or hyper / hypo glycemic episodes.  Heme/Lymph: No excessive bleeding, bruising or enlarged lymph nodes.  Physical Exam  BP 110/78  Pulse 52  Temp 97.7 F   Resp 16  Ht 6' 0.75"   Wt 209 lb 6.4 oz     BMI 27.81  General Appearance: Well nourished, in no apparent distress. Eyes: PERRLA, EOMs, conjunctiva no swelling or erythema, normal fundi and vessels. Sinuses: No frontal/maxillary tenderness ENT/Mouth: EACs patent / TMs  nl. Nares clear without erythema, swelling, mucoid exudates. Oral hygiene is good. No erythema, swelling, or exudate. Tongue normal, non-obstructing. Tonsils not swollen or erythematous. Hearing normal.  Neck: Supple, thyroid normal. No bruits, nodes or JVD. Respiratory: Respiratory effort normal.  BS equal and clear bilateral without rales, rhonci, wheezing or stridor. Cardio: Heart sounds are normal with regular rate and rhythm and no murmurs, rubs or gallops. Peripheral pulses are normal and equal bilaterally without edema. No aortic or femoral bruits. Chest: symmetric with normal excursions and percussion.  Abdomen: Flat, soft, with bowel sounds. Nontender, no guarding, rebound, hernias, masses, or organomegaly.  Lymphatics: Non tender without lymphadenopathy.  Genitourinary: No hernias. Musculoskeletal: Full ROM all peripheral extremities, joint stability, 5/5 strength, and normal gait. Skin: Warm and dry without rashes, lesions, cyanosis, clubbing or  ecchymosis.  Neuro: Cranial nerves intact, reflexes equal bilaterally. Normal muscle tone, no cerebellar symptoms. Sensation intact.  Pysch: Awake and oriented X 3 with normal affect, insight and judgment  appropriate.   Assessment and Plan  1. Essential hypertension  - Microalbumin / creatinine urine ratio - EKG 12-Lead - TSH  2. Hyperlipidemia  - Lipid panel  3. Abnormal glucose  - Hemoglobin A1c - Insulin, random  4. Vitamin D deficiency  - Vit D  25 hydroxy   5. Screening for rectal cancer   6. Other fatigue  - Vitamin B12 - Iron and TIBC - TSH  7. Medication management  - Urine Microscopic - CBC with Differential/Platelet - BASIC METABOLIC PANEL WITH GFR - Hepatic function panel - Magnesium   Continue prudent diet as discussed, weight control, BP monitoring, regular exercise, and medications as discussed.  Discussed med effects and SE's. Routine screening labs and tests as requested with regular follow-up as recommended.  Over 40 minutes of exam, counseling &  chart review was performed

## 2014-09-07 LAB — URINALYSIS, MICROSCOPIC ONLY
BACTERIA UA: NONE SEEN [HPF]
Casts: NONE SEEN [LPF]
Crystals: NONE SEEN [HPF]
RBC / HPF: NONE SEEN RBC/HPF (ref <=2)
SQUAMOUS EPITHELIAL / LPF: NONE SEEN [HPF] (ref <=5)
WBC UA: NONE SEEN WBC/HPF (ref <=5)
Yeast: NONE SEEN [HPF]

## 2014-09-07 LAB — VITAMIN D 25 HYDROXY (VIT D DEFICIENCY, FRACTURES): Vit D, 25-Hydroxy: 54 ng/mL (ref 30–100)

## 2014-09-07 LAB — HEMOGLOBIN A1C
HEMOGLOBIN A1C: 5.2 % (ref <5.7)
MEAN PLASMA GLUCOSE: 103 mg/dL (ref <117)

## 2014-09-07 LAB — MICROALBUMIN / CREATININE URINE RATIO
Creatinine, Urine: 153.2 mg/dL
MICROALB UR: 0.8 mg/dL (ref <2.0)
MICROALB/CREAT RATIO: 5.2 mg/g (ref 0.0–30.0)

## 2014-09-07 LAB — INSULIN, RANDOM: Insulin: 7.2 u[IU]/mL (ref 2.0–19.6)

## 2014-09-09 ENCOUNTER — Encounter: Payer: Self-pay | Admitting: Internal Medicine

## 2014-09-13 LAB — TB SKIN TEST
Induration: 0 mm
TB Skin Test: NEGATIVE

## 2014-09-14 ENCOUNTER — Other Ambulatory Visit: Payer: Self-pay | Admitting: Internal Medicine

## 2014-12-18 ENCOUNTER — Other Ambulatory Visit: Payer: Self-pay | Admitting: Internal Medicine

## 2015-02-11 DIAGNOSIS — K852 Alcohol induced acute pancreatitis without necrosis or infection: Secondary | ICD-10-CM

## 2015-02-11 HISTORY — PX: SHOULDER ARTHROSCOPY W/ ROTATOR CUFF REPAIR: SHX2400

## 2015-02-11 HISTORY — DX: Alcohol induced acute pancreatitis without necrosis or infection: K85.20

## 2015-03-12 ENCOUNTER — Ambulatory Visit (INDEPENDENT_AMBULATORY_CARE_PROVIDER_SITE_OTHER): Payer: 59 | Admitting: Internal Medicine

## 2015-03-12 ENCOUNTER — Encounter: Payer: Self-pay | Admitting: Internal Medicine

## 2015-03-12 VITALS — BP 128/70 | HR 62 | Temp 98.2°F | Resp 18 | Ht 72.0 in | Wt 211.0 lb

## 2015-03-12 DIAGNOSIS — Z23 Encounter for immunization: Secondary | ICD-10-CM

## 2015-03-12 DIAGNOSIS — Z309 Encounter for contraceptive management, unspecified: Secondary | ICD-10-CM | POA: Diagnosis not present

## 2015-03-12 DIAGNOSIS — E559 Vitamin D deficiency, unspecified: Secondary | ICD-10-CM

## 2015-03-12 DIAGNOSIS — Z3009 Encounter for other general counseling and advice on contraception: Secondary | ICD-10-CM

## 2015-03-12 DIAGNOSIS — R7309 Other abnormal glucose: Secondary | ICD-10-CM

## 2015-03-12 DIAGNOSIS — E785 Hyperlipidemia, unspecified: Secondary | ICD-10-CM | POA: Diagnosis not present

## 2015-03-12 DIAGNOSIS — I10 Essential (primary) hypertension: Secondary | ICD-10-CM | POA: Diagnosis not present

## 2015-03-12 DIAGNOSIS — Z79899 Other long term (current) drug therapy: Secondary | ICD-10-CM

## 2015-03-12 MED ORDER — BISOPROLOL-HYDROCHLOROTHIAZIDE 5-6.25 MG PO TABS: ORAL_TABLET | ORAL | Status: DC

## 2015-03-12 NOTE — Addendum Note (Signed)
Addended by: Wyndi Northrup A on: 03/12/2015 09:15 AM   Modules accepted: Orders

## 2015-03-12 NOTE — Progress Notes (Signed)
Patient ID: Shane Nguyen, male   DOB: January 16, 1981, 35 y.o.   MRN: [id number]  Assessment and Plan:  Hypertension:  -Continue medication,  -monitor blood pressure at home.  -Continue DASH diet.   -Reminder to go to the ER if any CP, SOB, nausea, dizziness, severe HA, changes vision/speech, left arm numbness and tingling, and jaw pain.  Cholesterol: -Continue diet and exercise.    Pre-diabetes: -Continue diet and exercise.    Vitamin D Def: -continue medications.   Update TDAP -baby on way needs updated TDAP  Need for vasectomy -referral to urology.  Continue diet and meds as discussed. Further disposition pending results of labs.  HPI 35 y.o. male  presents for 3 month follow up with hypertension, hyperlipidemia, prediabetes and vitamin D.   His blood pressure has been controlled at home, today their BP is BP: 128/70 mmHg.   He does not workout. He denies chest pain, shortness of breath, dizziness.  He recently had rotator cuff surgery.  He reports that was done by Dr. Murriel Hopper 4 weeks ago.    He is not on cholesterol medication and denies myalgias. His cholesterol is at goal. The cholesterol last visit was:   Lab Results  Component Value Date   CHOL 136 09/06/2014   HDL 40 09/06/2014   LDLCALC 86 09/06/2014   TRIG 50 09/06/2014   CHOLHDL 3.4 09/06/2014     He has been working on diet and exercise for prediabetes, and denies foot ulcerations, hyperglycemia, hypoglycemia , increased appetite, nausea, paresthesia of the feet, polydipsia, polyuria, visual disturbances, vomiting and weight loss. Last A1C in the office was:  Lab Results  Component Value Date   HGBA1C 5.2 09/06/2014    Patient is on Vitamin D supplement.  Lab Results  Component Value Date   VD25OH 62 09/06/2014      He does have his second child on the way.  He would like to have an updated tetanus today.    He would like to meet with a urologist to have a vasectomy planning session.    Current  Medications:  Current Outpatient Prescriptions on File Prior to Visit  Medication Sig Dispense Refill  . ALPRAZolam (XANAX) 1 MG tablet TAKE ONE TABLET BY MOUTH THREE TIMES DAILY AS NEEDED FOR ANXIETY 90 tablet 0  . cholecalciferol (VITAMIN D) 1000 UNITS tablet Take 1,000 Units by mouth daily.    . Flaxseed, Linseed, (FLAXSEED OIL) 1000 MG CAPS Take by mouth.    . Melatonin 1 MG CAPS Take by mouth.     No current facility-administered medications on file prior to visit.    Medical History:  Past Medical History  Diagnosis Date  . Hypertension   . Hemorrhoids   . Hyperlipidemia   . Vitamin D deficiency   . Insomnia     Allergies: No Known Allergies   Review of Systems:  Review of Systems  Constitutional: Negative for fever, chills and malaise/fatigue.  HENT: Negative for congestion, ear pain and sore throat.   Eyes: Negative.   Respiratory: Negative for cough, shortness of breath and wheezing.   Cardiovascular: Negative for chest pain, palpitations and leg swelling.  Gastrointestinal: Negative for heartburn, abdominal pain, diarrhea, constipation, blood in stool and melena.  Genitourinary: Negative.   Skin: Negative.   Neurological: Negative for dizziness, sensory change, loss of consciousness and headaches.  Psychiatric/Behavioral: Negative for depression. The patient is not nervous/anxious and does not have insomnia.     Family history- Review and unchanged  Social  history- Review and unchanged  Physical Exam: BP 128/70 mmHg  Pulse 62  Temp(Src) 98.2 F (36.8 C) (Temporal)  Resp 18  Ht 6' (1.829 m)  Wt 211 lb (95.709 kg)  BMI 28.61 kg/m2 Wt Readings from Last 3 Encounters:  03/12/15 211 lb (95.709 kg)  09/06/14 209 lb 6.4 oz (94.983 kg)  02/24/14 222 lb 12.8 oz (101.061 kg)    General Appearance: Well nourished well developed, in no apparent distress. Eyes: PERRLA, EOMs, conjunctiva no swelling or erythema ENT/Mouth: Ear canals normal without obstruction,  swelling, erythma, discharge.  TMs normal bilaterally.  Oropharynx moist, clear, without exudate, or postoropharyngeal swelling. Neck: Supple, thyroid normal,no cervical adenopathy  Respiratory: Respiratory effort normal, Breath sounds clear A&P without rhonchi, wheeze, or rale.  No retractions, no accessory usage. Cardio: RRR with no MRGs. Brisk peripheral pulses without edema.  Abdomen: Soft, + BS,  Non tender, no guarding, rebound, hernias, masses. Musculoskeletal: Full ROM, 5/5 strength, Normal gait Skin: Warm, dry without rashes, lesions, ecchymosis.  Neuro: Awake and oriented X 3, Cranial nerves intact. Normal muscle tone, no cerebellar symptoms. Psych: Normal affect, Insight and Judgment appropriate.    Terri Piedra, PA-C 9:04 AM [facility]

## 2015-08-13 ENCOUNTER — Emergency Department (HOSPITAL_BASED_OUTPATIENT_CLINIC_OR_DEPARTMENT_OTHER): Payer: 59

## 2015-08-13 ENCOUNTER — Inpatient Hospital Stay (HOSPITAL_BASED_OUTPATIENT_CLINIC_OR_DEPARTMENT_OTHER)
Admission: EM | Admit: 2015-08-13 | Discharge: 2015-08-20 | DRG: 440 | Disposition: A | Discharge status: Home / Self Care | Payer: 59 | Attending: Internal Medicine | Admitting: Internal Medicine

## 2015-08-13 ENCOUNTER — Encounter (HOSPITAL_BASED_OUTPATIENT_CLINIC_OR_DEPARTMENT_OTHER): Payer: Self-pay

## 2015-08-13 ENCOUNTER — Observation Stay (HOSPITAL_COMMUNITY): Payer: 59

## 2015-08-13 DIAGNOSIS — K852 Alcohol induced acute pancreatitis without necrosis or infection: Secondary | ICD-10-CM | POA: Diagnosis not present

## 2015-08-13 DIAGNOSIS — R739 Hyperglycemia, unspecified: Secondary | ICD-10-CM | POA: Diagnosis present

## 2015-08-13 DIAGNOSIS — E559 Vitamin D deficiency, unspecified: Secondary | ICD-10-CM | POA: Diagnosis present

## 2015-08-13 DIAGNOSIS — I10 Essential (primary) hypertension: Secondary | ICD-10-CM | POA: Diagnosis not present

## 2015-08-13 DIAGNOSIS — E785 Hyperlipidemia, unspecified: Secondary | ICD-10-CM | POA: Diagnosis present

## 2015-08-13 DIAGNOSIS — E876 Hypokalemia: Secondary | ICD-10-CM | POA: Diagnosis present

## 2015-08-13 DIAGNOSIS — K219 Gastro-esophageal reflux disease without esophagitis: Secondary | ICD-10-CM | POA: Diagnosis present

## 2015-08-13 DIAGNOSIS — Z8719 Personal history of other diseases of the digestive system: Secondary | ICD-10-CM | POA: Diagnosis present

## 2015-08-13 DIAGNOSIS — F419 Anxiety disorder, unspecified: Secondary | ICD-10-CM | POA: Diagnosis present

## 2015-08-13 DIAGNOSIS — R509 Fever, unspecified: Secondary | ICD-10-CM

## 2015-08-13 DIAGNOSIS — K859 Acute pancreatitis without necrosis or infection, unspecified: Secondary | ICD-10-CM

## 2015-08-13 DIAGNOSIS — Z87891 Personal history of nicotine dependence: Secondary | ICD-10-CM

## 2015-08-13 DIAGNOSIS — K5730 Diverticulosis of large intestine without perforation or abscess without bleeding: Secondary | ICD-10-CM | POA: Diagnosis present

## 2015-08-13 DIAGNOSIS — R1013 Epigastric pain: Secondary | ICD-10-CM | POA: Diagnosis not present

## 2015-08-13 LAB — CBC WITH DIFFERENTIAL/PLATELET
Basophils Absolute: 0 10*3/uL (ref 0.0–0.1)
Basophils Relative: 0 %
EOS ABS: 0 10*3/uL (ref 0.0–0.7)
EOS PCT: 0 %
HCT: 44.8 % (ref 39.0–52.0)
Hemoglobin: 15.8 g/dL (ref 13.0–17.0)
LYMPHS ABS: 1 10*3/uL (ref 0.7–4.0)
Lymphocytes Relative: 9 %
MCH: 29.6 pg (ref 26.0–34.0)
MCHC: 35.3 g/dL (ref 30.0–36.0)
MCV: 83.9 fL (ref 78.0–100.0)
MONO ABS: 0.9 10*3/uL (ref 0.1–1.0)
MONOS PCT: 8 %
Neutro Abs: 9.4 10*3/uL — ABNORMAL HIGH (ref 1.7–7.7)
Neutrophils Relative %: 83 %
PLATELETS: 205 10*3/uL (ref 150–400)
RBC: 5.34 MIL/uL (ref 4.22–5.81)
RDW: 12.1 % (ref 11.5–15.5)
WBC: 11.3 10*3/uL — ABNORMAL HIGH (ref 4.0–10.5)

## 2015-08-13 LAB — COMPREHENSIVE METABOLIC PANEL
ALK PHOS: 51 U/L (ref 38–126)
ALT: 19 U/L (ref 17–63)
ANION GAP: 9 (ref 5–15)
AST: 23 U/L (ref 15–41)
Albumin: 4.3 g/dL (ref 3.5–5.0)
BUN: 17 mg/dL (ref 6–20)
CALCIUM: 9.1 mg/dL (ref 8.9–10.3)
CO2: 29 mmol/L (ref 22–32)
Chloride: 98 mmol/L — ABNORMAL LOW (ref 101–111)
Creatinine, Ser: 0.82 mg/dL (ref 0.61–1.24)
GFR calc non Af Amer: >60 mL/min (ref >60)
Glucose, Bld: 132 mg/dL — ABNORMAL HIGH (ref 65–99)
Potassium: 3 mmol/L — ABNORMAL LOW (ref 3.5–5.1)
SODIUM: 136 mmol/L (ref 135–145)
Total Bilirubin: 0.7 mg/dL (ref 0.3–1.2)
Total Protein: 7.3 g/dL (ref 6.5–8.1)

## 2015-08-13 LAB — LIPASE, BLOOD: Lipase: 980 U/L — ABNORMAL HIGH (ref 11–51)

## 2015-08-13 MED ORDER — MAGNESIUM CITRATE PO SOLN: 1.0000 | Freq: Once | ORAL | Status: DC | PRN

## 2015-08-13 MED ORDER — TRAZODONE HCL 50 MG PO TABS
25.0000 mg | ORAL_TABLET | Freq: Every evening | ORAL | Status: DC | PRN
  Administered 2015-08-13 – 2015-08-19 (×4): 25 mg via ORAL
  Filled 2015-08-13 (×4): qty 1

## 2015-08-13 MED ORDER — SODIUM CHLORIDE 0.9 % IV SOLN
INTRAVENOUS | Status: DC
  Administered 2015-08-14 – 2015-08-15 (×5): via INTRAVENOUS
  Administered 2015-08-15: 150 mL/h via INTRAVENOUS
  Administered 2015-08-15: 19:00:00 via INTRAVENOUS
  Administered 2015-08-16: 1 mL via INTRAVENOUS
  Administered 2015-08-16 (×2): via INTRAVENOUS
  Administered 2015-08-17: 1 mL via INTRAVENOUS
  Administered 2015-08-17: 12:00:00 via INTRAVENOUS

## 2015-08-13 MED ORDER — POTASSIUM CHLORIDE 10 MEQ/100ML IV SOLN
10.0000 meq | INTRAVENOUS | Status: AC
  Administered 2015-08-13 (×3): 10 meq via INTRAVENOUS
  Filled 2015-08-13 (×4): qty 100

## 2015-08-13 MED ORDER — SODIUM CHLORIDE 0.9 % IV SOLN
INTRAVENOUS | Status: AC
  Administered 2015-08-13: 14:00:00 via INTRAVENOUS

## 2015-08-13 MED ORDER — HYDROCODONE-ACETAMINOPHEN 5-325 MG PO TABS
1.0000 | ORAL_TABLET | ORAL | Status: DC | PRN
  Administered 2015-08-16 – 2015-08-20 (×22): 2 via ORAL
  Filled 2015-08-13 (×22): qty 2

## 2015-08-13 MED ORDER — ONDANSETRON HCL 4 MG/2ML IJ SOLN
4.0000 mg | Freq: Once | INTRAMUSCULAR | Status: AC
  Administered 2015-08-13: 4 mg via INTRAVENOUS
  Filled 2015-08-13: qty 2

## 2015-08-13 MED ORDER — ONDANSETRON HCL 4 MG/2ML IJ SOLN
4.0000 mg | Freq: Four times a day (QID) | INTRAMUSCULAR | Status: DC | PRN
  Administered 2015-08-13 – 2015-08-15 (×6): 4 mg via INTRAVENOUS
  Filled 2015-08-13 (×6): qty 2

## 2015-08-13 MED ORDER — BISACODYL 10 MG RE SUPP
10.0000 mg | Freq: Every day | RECTAL | Status: DC | PRN
  Filled 2015-08-13: qty 1

## 2015-08-13 MED ORDER — HYDROMORPHONE HCL 1 MG/ML IJ SOLN
1.0000 mg | INTRAMUSCULAR | Status: DC | PRN
  Administered 2015-08-13: 1 mg via INTRAVENOUS
  Filled 2015-08-13: qty 1

## 2015-08-13 MED ORDER — IOPAMIDOL (ISOVUE-300) INJECTION 61%
100.0000 mL | Freq: Once | INTRAVENOUS | Status: AC | PRN
  Administered 2015-08-13: 100 mL via INTRAVENOUS

## 2015-08-13 MED ORDER — ONDANSETRON HCL 4 MG/2ML IJ SOLN
4.0000 mg | Freq: Three times a day (TID) | INTRAMUSCULAR | Status: DC | PRN
  Administered 2015-08-13: 4 mg via INTRAVENOUS
  Filled 2015-08-13: qty 2

## 2015-08-13 MED ORDER — ENOXAPARIN SODIUM 40 MG/0.4ML ~~LOC~~ SOLN
40.0000 mg | SUBCUTANEOUS | Status: DC
  Administered 2015-08-13 – 2015-08-19 (×7): 40 mg via SUBCUTANEOUS
  Filled 2015-08-13 (×7): qty 0.4

## 2015-08-13 MED ORDER — SENNOSIDES-DOCUSATE SODIUM 8.6-50 MG PO TABS: 1.0000 | ORAL_TABLET | Freq: Every evening | ORAL | Status: DC | PRN

## 2015-08-13 MED ORDER — ALPRAZOLAM 0.5 MG PO TABS
1.0000 mg | ORAL_TABLET | Freq: Three times a day (TID) | ORAL | Status: DC | PRN
  Administered 2015-08-15 – 2015-08-19 (×6): 1 mg via ORAL
  Filled 2015-08-13 (×6): qty 2

## 2015-08-13 MED ORDER — MORPHINE SULFATE (PF) 2 MG/ML IV SOLN: 1.0000 mg | INTRAVENOUS | Status: DC | PRN

## 2015-08-13 MED ORDER — HYDRALAZINE HCL 20 MG/ML IJ SOLN: 5.0000 mg | Freq: Three times a day (TID) | INTRAMUSCULAR | Status: DC | PRN

## 2015-08-13 MED ORDER — METOCLOPRAMIDE HCL 5 MG/ML IJ SOLN
10.0000 mg | Freq: Once | INTRAMUSCULAR | Status: AC
  Administered 2015-08-13: 10 mg via INTRAVENOUS
  Filled 2015-08-13: qty 2

## 2015-08-13 MED ORDER — ACETAMINOPHEN 325 MG PO TABS: 650.0000 mg | ORAL_TABLET | Freq: Four times a day (QID) | ORAL | Status: DC | PRN

## 2015-08-13 MED ORDER — MORPHINE SULFATE (PF) 4 MG/ML IV SOLN
4.0000 mg | Freq: Once | INTRAVENOUS | Status: AC
  Administered 2015-08-13: 4 mg via INTRAVENOUS
  Filled 2015-08-13: qty 1

## 2015-08-13 MED ORDER — HYDROMORPHONE HCL 1 MG/ML IJ SOLN
1.0000 mg | INTRAMUSCULAR | Status: DC | PRN
  Administered 2015-08-13 – 2015-08-18 (×22): 1 mg via INTRAVENOUS
  Filled 2015-08-13 (×22): qty 1

## 2015-08-13 MED ORDER — SODIUM CHLORIDE 0.9 % IV SOLN
INTRAVENOUS | Status: DC
  Administered 2015-08-13: 16:00:00 via INTRAVENOUS

## 2015-08-13 MED ORDER — SODIUM CHLORIDE 0.9 % IV BOLUS (SEPSIS)
1000.0000 mL | Freq: Once | INTRAVENOUS | Status: AC
  Administered 2015-08-13: 1000 mL via INTRAVENOUS

## 2015-08-13 MED ORDER — KETOROLAC TROMETHAMINE 15 MG/ML IJ SOLN
15.0000 mg | Freq: Once | INTRAMUSCULAR | Status: AC
  Administered 2015-08-13: 15 mg via INTRAVENOUS
  Filled 2015-08-13: qty 1

## 2015-08-13 MED ORDER — ACETAMINOPHEN 650 MG RE SUPP: 650.0000 mg | Freq: Four times a day (QID) | RECTAL | Status: DC | PRN

## 2015-08-13 MED ORDER — BISOPROLOL-HYDROCHLOROTHIAZIDE 5-6.25 MG PO TABS: 1.0000 | ORAL_TABLET | Freq: Every day | ORAL | Status: DC

## 2015-08-13 NOTE — ED Notes (Signed)
MD at bedside. 

## 2015-08-13 NOTE — ED Provider Notes (Signed)
CSN: [id number]     Arrival date & time 08/13/15  0907 History   First MD Initiated Contact with Patient 08/13/15 [phone]     Chief Complaint  Patient presents with  . Abdominal Pain     (Consider location/radiation/quality/duration/timing/severity/associated sxs/prior Treatment) HPI Comments: Patient is a 35 year old male with history of hypertension. He presents for evaluation of severe abdominal pain that started approximately 1 AM. He reports being a cookout yesterday and ate a hamburger and 2 hotdogs along with a small quantity of alcohol. He felt fine until approximately 1 AM when he woke with severe epigastric pain. He reports taking Zantac which seemed to help somewhat. He attempted to go to work this morning when his pain returned. It is much worse than it was yesterday evening. He denies any fevers or chills. He denies any vomiting or diarrhea. He did have a small bowel movement at work which did seem to help somewhat.  Patient is a 35 y.o. male presenting with abdominal pain. The history is provided by the patient.  Abdominal Pain Pain location:  Epigastric Pain quality: stabbing   Pain radiates to:  Back Pain severity:  Severe Onset quality:  Sudden Duration:  8 hours Timing:  Constant Progression:  Worsening Chronicity:  New Relieved by:  Nothing Worsened by:  Nothing tried Ineffective treatments:  None tried   Past Medical History  Diagnosis Date  . Hypertension   . Hemorrhoids   . Hyperlipidemia   . Vitamin D deficiency   . Insomnia    Past Surgical History  Procedure Laterality Date  . Anterior cruciate ligament repair      bilateral  . Knee arthroscopy Right 1997    murphy   Family History  Problem Relation Age of Onset  . Hypertension Father   . Hyperlipidemia Father    Social History  Substance Use Topics  . Smoking status: Former Smoker -- 1.00 packs/day for 10 years    Quit date: 02/11/2004  . Smokeless tobacco: Never Used  . Alcohol Use: Yes   Comment: 1/2 of a 5th q weekend    Review of Systems  Gastrointestinal: Positive for abdominal pain.  All other systems reviewed and are negative.     Allergies  Review of patient's allergies indicates no known allergies.  Home Medications   Prior to Admission medications   Medication Sig Start Date End Date Taking? Authorizing Provider  ranitidine (ZANTAC) 150 MG capsule Take 150 mg by mouth 2 (two) times daily.   Yes Historical Provider, MD  ALPRAZolam Prudy Feeler(XANAX) 1 MG tablet TAKE ONE TABLET BY MOUTH THREE TIMES DAILY AS NEEDED FOR ANXIETY 12/18/14   Quentin MullingAmanda Collier, PA-C  bisoprolol-hydrochlorothiazide [facility]ZIAC) 5-6.25 MG tablet TAKE ONE TABLET BY MOUTH ONCE DAILY FOR BLOOD PRESSURE 03/12/15   Terri Piedraourtney Forcucci, PA-C  cholecalciferol (VITAMIN D) 1000 UNITS tablet Take 1,000 Units by mouth daily.    Historical Provider, MD  Flaxseed, Linseed, (FLAXSEED OIL) 1000 MG CAPS Take by mouth.    Historical Provider, MD  Melatonin 1 MG CAPS Take by mouth.    Historical Provider, MD   BP 155/94 mmHg  Pulse 65  Temp(Src) 97.9 F (36.6 C) (Oral)  Resp 20  Ht 6' (1.829 m)  Wt 210 lb (95.255 kg)  BMI 28.47 kg/m2  SpO2 98% Physical Exam  Constitutional: He is oriented to person, place, and time. He appears well-developed and well-nourished. No distress.  HENT:  Head: Normocephalic and atraumatic.  Mouth/Throat: Oropharynx is clear and moist.  Neck:  Normal range of motion. Neck supple.  Cardiovascular: Normal rate and regular rhythm.  Exam reveals no friction rub.   No murmur heard. Pulmonary/Chest: Effort normal and breath sounds normal. No respiratory distress. He has no wheezes. He has no rales.  Abdominal: Soft. Bowel sounds are normal. He exhibits no distension. There is tenderness. There is no rebound and no guarding.  There is tenderness to palpation in the epigastric region.  Musculoskeletal: Normal range of motion. He exhibits no edema.  Neurological: He is alert and oriented to person,  place, and time. Coordination normal.  Skin: Skin is warm and dry. He is not diaphoretic.  Nursing note and vitals reviewed.   ED Course  Procedures (including critical care time) Labs Review Labs Reviewed  CBC WITH DIFFERENTIAL/PLATELET  COMPREHENSIVE METABOLIC PANEL  LIPASE, BLOOD    Imaging Review No results found. I have personally reviewed and evaluated these images and lab results as part of my medical decision-making.    MDM   Final diagnoses:  None    Patient is a 35 year old male with epigastric pain related to acute pancreatitis. He reports he drank one mixed drink yesterday, but denies excessive alcohol consumption. He has received 2 doses of morphine and I feel will require admission for hydration and pain control. I've spoken with Dr. Montez Moritaarter who agrees to admit. I am uncertain as to the etiology of this pancreatitis. There are no gallstones or evidence for biliary obstruction and he denies excessive alcohol consumption. The cause of this may be idiopathic.    Geoffery Lyonsouglas Shley Dolby, MD 08/13/15 1200

## 2015-08-13 NOTE — Progress Notes (Signed)
Paged admitting [facility] doctor that patient is in room [phone]

## 2015-08-13 NOTE — Progress Notes (Signed)
Patient accepted [facility], observation status, med surg bed.  Diagnosis: Acute pancreatitis.  Lipase 980.  Normal LFTs.  Mild leukocytosis, mild hypokalemia.  CT of the abdomen and pelvis with contrast shows peripancreatic edema without complication at this point.

## 2015-08-13 NOTE — ED Notes (Signed)
MD at bedside to discuss results of testing. 

## 2015-08-13 NOTE — ED Notes (Signed)
Pt reports last night ate too much and today woke up with pain in abdomen.  Reports took zantac, drank some milk and felt some relief.  Reports then went to work and had stool and felt slightly better.  Pain is intermittently worse.  Denies n/v but reports feels need to burp.

## 2015-08-13 NOTE — H&P (Signed)
History and Physical    Shane Nguyen ZOX:[id number] DOB: 11/14/1980 DOA: 08/13/2015   PCP: [name],[name], MD   Patient coming from:  Home   Chief Complaint: Epigastric pain   HPI: Shane Nguyen is a 35 y.o. male with medical history significant for HTN, HLD, anxiety, "Pre-diabetes", Vit D deficiency, [facility] for the management of acute pancreatitis. In review, he developed acute severe abdominal pain since around 1 AM. He reports being at a cookout  yesterday, consuming, bothersome hot dogs, and to "black and white drinks" (vodka/amaretto/punch) initially he took Zantac, with some relief, but the pain eventually returned, stabbing in quality, radiating to the back, severe,and constant. The patient denies any vomiting or diarrhea. He denies any fever, chills or night sweats. He denies any respiratory or cardiac complaints. Of note, the patient had been treated about 1 week ago for sinusitis while [place] was [place] in [place], prescribed Z pack and steroids, finishing its course on July 2.he denies any myalgias. He reports that his symptoms are new, and he never had a similar episodes, or had been hospitalized for these complaints.  ED Course:  BP 139/80 mmHg  Pulse 56  Temp(Src) 98 F (36.7 C) (Oral)  Resp 20  Ht 6' (1.829 m)  Wt 94.9 kg (209 lb 3.5 oz)  BMI 28.37 kg/m2  SpO2 100%   white count 11.3. Potassium 3.0. Glucose 132. Lipase 1980. Bilirubin 0.7.  CT A/p Acute pancreatitis without evidence of complication by CT. Minimal sigmoid diverticulosis. No gallstones or evidence of biliary obstruction [facility], he received IV hydration,2 doses of morphine, with some relief. He is nothing by mouth.  Review of Systems: As per HPI otherwise 10 point review of systems negative.   Past Medical History  Diagnosis Date  . Hypertension   . Hemorrhoids   . Hyperlipidemia   . Vitamin D deficiency   . Insomnia     Past Surgical  History  Procedure Laterality Date  . Anterior cruciate ligament repair      bilateral  . Knee arthroscopy Right 1997    murphy    Social History Social History   Social History  . Marital Status: Married    Spouse Name: N/A  . Number of Children: N/A  . Years of Education: N/A   Occupational History  . Not on file.   Social History Main Topics  . Smoking status: Former Smoker -- 1.00 packs/day for 10 years    Quit date: 02/11/2004  . Smokeless tobacco: Never Used  . Alcohol Use: Yes     Comment: 1/2 of a 5th q weekend  . Drug Use: No  . Sexual Activity: Not on file   Other Topics Concern  . Not on file   Social History Narrative     No Known Allergies  Family History  Problem Relation Age of Onset  . Hypertension Father   . Hyperlipidemia Father       Prior to Admission medications   Medication Sig Start Date End Date Taking? Authorizing Provider  ranitidine (ZANTAC) 150 MG capsule Take 150 mg by mouth 2 (two) times daily.   Yes Historical Provider, MD  ALPRAZolam Prudy Feeler(XANAX) 1 MG tablet TAKE ONE TABLET BY MOUTH THREE TIMES DAILY AS NEEDED FOR ANXIETY 12/18/14   Quentin MullingAmanda Collier, PA-C  bisoprolol-hydrochlorothiazide [facility](ZIAC) 5-6.25 MG tablet TAKE ONE TABLET BY MOUTH ONCE DAILY FOR BLOOD PRESSURE 03/12/15   Terri Piedraourtney Forcucci, PA-C  cholecalciferol (  VITAMIN D) 1000 UNITS tablet Take 1,000 Units by mouth daily.    Historical Provider, MD  Flaxseed, Linseed, (FLAXSEED OIL) 1000 MG CAPS Take by mouth.    Historical Provider, MD  Melatonin 1 MG CAPS Take by mouth.    Historical Provider, MD    Physical Exam:    Filed Vitals:   08/13/15 0913 08/13/15 1129 08/13/15 1357  BP: 155/94 118/71 139/80  Pulse: 65 66 56  Temp: 97.9 F (36.6 C)  98 F (36.7 C)  TempSrc: Oral  Oral  Resp: Height: 6' (1.829 m)    Weight: 95.255 kg (210 lb)  94.9 kg (209 lb 3.5 oz)  SpO2: 98% 100% 100%       Constitutional: NAD, anxious   Filed Vitals:   08/13/15 0913  08/13/15 1129 08/13/15 1357  BP: 155/94 118/71 139/80  Pulse: 65 66 56  Temp: 97.9 F (36.6 C)  98 F (36.7 C)  TempSrc: Oral  Oral  Resp: Height: 6' (1.829 m)    Weight: 95.255 kg (210 lb)  94.9 kg (209 lb 3.5 oz)  SpO2: 98% 100% 100%   Eyes: PERRL, lids and conjunctivae normal ENMT: Mucous membranes are moist. Posterior pharynx clear of any exudate or lesions.Normal dentition.  Neck: normal, supple, no masses, no thyromegaly Respiratory: clear to auscultation bilaterally, no wheezing, no crackles. Normal respiratory effort. No accessory muscle use.  Cardiovascular: Regular rate and rhythm, no murmurs / rubs / gallops. No extremity edema. 2+ pedal pulses. No carotid bruits.  Abdomen: mid epigastric tenderness with radiation to the LUQ    no masses palpated. No hepatosplenomegaly. Bowel sounds positive.  Musculoskeletal: no clubbing / cyanosis. No joint deformity upper and lower extremities. Good ROM, no contractures. Normal muscle tone.  Skin: no rashes, lesions, ulcers.  Neurologic: CN 2-12 grossly intact. Sensation intact, DTR normal. Strength 5/5 in all 4.  Psychiatric: Normal judgment and insight. Alert and oriented x 3. Normal mood.     Labs on Admission: I have personally reviewed following labs and imaging studies  CBC:  Recent Labs Lab 08/13/15 0943  WBC 11.3*  NEUTROABS 9.4*  HGB 15.8  HCT 44.8  MCV 83.9  PLT 205    Basic Metabolic Panel:  Recent Labs Lab 08/13/15 0943  NA 136  K 3.0*  CL 98*  CO2 29  GLUCOSE 132*  BUN 17  CREATININE 0.82  CALCIUM 9.1    GFR: Estimated Creatinine Clearance: 148.8 mL/min (by C-G formula based on Cr of 0.82).  Liver Function Tests:  Recent Labs Lab 08/13/15 0943  AST 23  ALT 19  ALKPHOS 51  BILITOT 0.7  PROT 7.3  ALBUMIN 4.3    Recent Labs Lab 08/13/15 0943  LIPASE 980*   No results for input(s): AMMONIA in the last 168 hours.  Coagulation Profile: No results for input(s): INR, PROTIME  in the last 168 hours.  Cardiac Enzymes: No results for input(s): CKTOTAL, CKMB, CKMBINDEX, TROPONINI in the last 168 hours.  BNP (last 3 results) No results for input(s): PROBNP in the last 8760 hours.  HbA1C: No results for input(s): HGBA1C in the last 72 hours.  CBG: No results for input(s): GLUCAP in the last 168 hours.  Lipid Profile: No results for input(s): CHOL, HDL, LDLCALC, TRIG, CHOLHDL, LDLDIRECT in the last 72 hours.  Thyroid Function Tests: No results for input(s): TSH, T4TOTAL, FREET4, T3FREE, THYROIDAB in the last 72 hours.  Anemia Panel: No results for  input(s): VITAMINB12, FOLATE, FERRITIN, TIBC, IRON, RETICCTPCT in the last 72 hours.  Urine analysis: No results found for: COLORURINE, APPEARANCEUR, LABSPEC, PHURINE, GLUCOSEU, HGBUR, BILIRUBINUR, KETONESUR, PROTEINUR, UROBILINOGEN, NITRITE, LEUKOCYTESUR  Sepsis Labs: @LABRCNTIP (procalcitonin:4,lacticidven:4) )No results found for this or any previous visit (from the past 240 hour(s)).   Radiological Exams on Admission: Ct Abdomen Pelvis W Contrast  08/13/2015  CLINICAL DATA:  Awoke with abdominal pain today, states he ate too much last night, intermittent pain, feels need to burp, history hypertension EXAM: CT ABDOMEN AND PELVIS WITH CONTRAST TECHNIQUE: Multidetector CT imaging of the abdomen and pelvis was performed using the standard protocol following bolus administration of intravenous contrast. Sagittal and coronal MPR images reconstructed from axial data set. CONTRAST:  100mL ISOVUE-300 IOPAMIDOL (ISOVUE-300) INJECTION 61% IV. Dilute oral contrast. COMPARISON:  None FINDINGS: Lower chest:  Lung bases clear Hepatobiliary: Liver and gallbladder normal appearance Pancreas: Peripancreatic edema extending into small bowel mesenteric and adjacent to hepatic flexure of colon compatible with acute pancreatitis. No definite pancreatic mass, calcification, ductal dilatation or necrosis. No discrete peripancreatic fluid  collections. Spleen: Normal appearance Adrenals/Urinary Tract: Kidneys, ureters, bladder, and prostate gland normal appearance. Stomach/Bowel: Minimal sigmoid diverticulosis without evidence of diverticulitis. Stomach and bowel loops otherwise normal appearance. Vascular/Lymphatic: Aorta normal caliber. Major intra-abdominal vascular structures are patent, including portal vein, SMV and splenic vein. Scattered normal size retroperitoneal nodes without adenopathy. Reproductive: N/A Other: Small amount of free fluid dependently in pelvis. No free intraperitoneal air. Musculoskeletal: Osseous structures unremarkable. IMPRESSION: Acute pancreatitis without evidence of complication by CT. Minimal sigmoid diverticulosis. Electronically Signed   By: Ulyses SouthwardMark  Boles M.D.   On: 08/13/2015 10:31    EKG: Independently reviewed.  Assessment/Plan Active Problems:   Acute pancreatitis   Pancreatitis: Lipase 980, normal LFTs . AF VSS, WBC 11.3  CT abdomen showing peripancreatic edema without complication at this point   No history of previous pancreatic flare. Patient is social ETOH drinker.  Admit to med surg obs  - NPO IVF 150 cc/h for 12 hrs  Antiemetics - lipid panel  A1C  Leukocytosis, likely reactive and recent steroids  WBC 11.3 Afebrile   IVF  Cultures   Repeat CBC in AM   Hypertension BP 139/80 mmHg  Pulse 56  Temp(Src) 98 F (36.7 C) (Oral)  Resp 20  Ht 6' (1.829 m)  Wt 94.9 kg (209 lb 3.5 oz)  BMI 28.37 kg/m2  SpO2 100% Controlled Continue meds   Hyperlipidemia Not on statin  Check Lipid panel   Hypokalemia, may be due to diuretics. Current K  3.0 KCL IV  Repeat CMET in am   Pre Diabetes Current blood sugar level is 132 Lab Results  Component Value Date   HGBA1C 5.2 09/06/2014  Hgb A1C Hold home oral diabetic medications.  Heart healthy carb modified diet.  Anxiety Continue Xanax   DVT prophylaxis: Lovenox   Code Status:   Full    Family Communication:  Discussed with  patient wife Disposition Plan: Expect patient to be discharged to home after condition improves Consults called:    None Admission status:  Obs Medsurg   Marlowe KaysWERTMAN,Gema Ringold E, PA-C [facility]   If 7PM-7AM, please contact night-coverage [email or web address] Password [email or web address]  08/13/2015, 2:08 PM

## 2015-08-13 NOTE — ED Notes (Signed)
Patient transported to CT via stretcher per tech. 

## 2015-08-14 DIAGNOSIS — K219 Gastro-esophageal reflux disease without esophagitis: Secondary | ICD-10-CM | POA: Diagnosis present

## 2015-08-14 DIAGNOSIS — E559 Vitamin D deficiency, unspecified: Secondary | ICD-10-CM | POA: Diagnosis present

## 2015-08-14 DIAGNOSIS — F419 Anxiety disorder, unspecified: Secondary | ICD-10-CM | POA: Diagnosis present

## 2015-08-14 DIAGNOSIS — R739 Hyperglycemia, unspecified: Secondary | ICD-10-CM | POA: Diagnosis present

## 2015-08-14 DIAGNOSIS — K8500 Idiopathic acute pancreatitis without necrosis or infection: Secondary | ICD-10-CM | POA: Diagnosis not present

## 2015-08-14 DIAGNOSIS — K859 Acute pancreatitis without necrosis or infection, unspecified: Secondary | ICD-10-CM | POA: Diagnosis not present

## 2015-08-14 DIAGNOSIS — K852 Alcohol induced acute pancreatitis without necrosis or infection: Secondary | ICD-10-CM | POA: Diagnosis present

## 2015-08-14 DIAGNOSIS — K8512 Biliary acute pancreatitis with infected necrosis: Secondary | ICD-10-CM | POA: Diagnosis not present

## 2015-08-14 DIAGNOSIS — Z87891 Personal history of nicotine dependence: Secondary | ICD-10-CM | POA: Diagnosis not present

## 2015-08-14 DIAGNOSIS — K860 Alcohol-induced chronic pancreatitis: Secondary | ICD-10-CM | POA: Diagnosis not present

## 2015-08-14 DIAGNOSIS — K5730 Diverticulosis of large intestine without perforation or abscess without bleeding: Secondary | ICD-10-CM | POA: Diagnosis present

## 2015-08-14 DIAGNOSIS — E785 Hyperlipidemia, unspecified: Secondary | ICD-10-CM | POA: Diagnosis present

## 2015-08-14 DIAGNOSIS — I10 Essential (primary) hypertension: Secondary | ICD-10-CM | POA: Diagnosis present

## 2015-08-14 DIAGNOSIS — E876 Hypokalemia: Secondary | ICD-10-CM | POA: Diagnosis present

## 2015-08-14 DIAGNOSIS — R1013 Epigastric pain: Secondary | ICD-10-CM | POA: Diagnosis present

## 2015-08-14 LAB — LIPASE, BLOOD: LIPASE: 629 U/L — AB (ref 11–51)

## 2015-08-14 LAB — COMPREHENSIVE METABOLIC PANEL
ALBUMIN: 3.5 g/dL (ref 3.5–5.0)
ALK PHOS: 43 U/L (ref 38–126)
ALT: 15 U/L — AB (ref 17–63)
ANION GAP: 6 (ref 5–15)
AST: 17 U/L (ref 15–41)
BILIRUBIN TOTAL: 1.2 mg/dL (ref 0.3–1.2)
BUN: 7 mg/dL (ref 6–20)
CALCIUM: 8.9 mg/dL (ref 8.9–10.3)
CO2: 30 mmol/L (ref 22–32)
CREATININE: 0.67 mg/dL (ref 0.61–1.24)
Chloride: 99 mmol/L — ABNORMAL LOW (ref 101–111)
GFR calc non Af Amer: >60 mL/min (ref >60)
GLUCOSE: 104 mg/dL — AB (ref 65–99)
Potassium: 3.9 mmol/L (ref 3.5–5.1)
Sodium: 135 mmol/L (ref 135–145)
TOTAL PROTEIN: 6.4 g/dL — AB (ref 6.5–8.1)

## 2015-08-14 LAB — CBC
HCT: 45.2 % (ref 39.0–52.0)
HEMOGLOBIN: 15.1 g/dL (ref 13.0–17.0)
MCH: 28.9 pg (ref 26.0–34.0)
MCHC: 33.4 g/dL (ref 30.0–36.0)
MCV: 86.6 fL (ref 78.0–100.0)
PLATELETS: 205 10*3/uL (ref 150–400)
RBC: 5.22 MIL/uL (ref 4.22–5.81)
RDW: 12.2 % (ref 11.5–15.5)
WBC: 15.1 10*3/uL — ABNORMAL HIGH (ref 4.0–10.5)

## 2015-08-14 LAB — LIPID PANEL
CHOLESTEROL: 126 mg/dL (ref 0–200)
HDL: 41 mg/dL (ref >40)
LDL CALC: 76 mg/dL (ref 0–99)
TRIGLYCERIDES: 43 mg/dL (ref <150)
Total CHOL/HDL Ratio: 3.1 RATIO
VLDL: 9 mg/dL (ref 0–40)

## 2015-08-14 LAB — HEMOGLOBIN A1C
Hgb A1c MFr Bld: 5 % (ref 4.8–5.6)
Mean Plasma Glucose: 97 mg/dL

## 2015-08-14 MED ORDER — FOLIC ACID 1 MG PO TABS
1.0000 mg | ORAL_TABLET | Freq: Every day | ORAL | Status: DC
  Administered 2015-08-16 – 2015-08-20 (×5): 1 mg via ORAL
  Filled 2015-08-14 (×8): qty 1

## 2015-08-14 MED ORDER — VITAMIN B-1 100 MG PO TABS
100.0000 mg | ORAL_TABLET | Freq: Every day | ORAL | Status: DC
  Administered 2015-08-16 – 2015-08-20 (×5): 100 mg via ORAL
  Filled 2015-08-14 (×7): qty 1

## 2015-08-14 MED ORDER — THIAMINE HCL 100 MG/ML IJ SOLN
100.0000 mg | Freq: Every day | INTRAMUSCULAR | Status: DC
  Administered 2015-08-15: 100 mg via INTRAVENOUS
  Filled 2015-08-14: qty 2

## 2015-08-14 MED ORDER — LORAZEPAM 1 MG PO TABS: 1.0000 mg | ORAL_TABLET | Freq: Four times a day (QID) | ORAL | Status: AC | PRN

## 2015-08-14 MED ORDER — LORAZEPAM 2 MG/ML IJ SOLN: 1.0000 mg | Freq: Four times a day (QID) | INTRAMUSCULAR | Status: AC | PRN

## 2015-08-14 MED ORDER — ADULT MULTIVITAMIN W/MINERALS CH
1.0000 | ORAL_TABLET | Freq: Every day | ORAL | Status: DC
  Administered 2015-08-16 – 2015-08-20 (×5): 1 via ORAL
  Filled 2015-08-14 (×7): qty 1

## 2015-08-14 NOTE — Progress Notes (Signed)
[facility] PROGRESS NOTE  Shane Nguyen WUJ:[id number] DOB: 05/30/1980 DOA: 08/13/2015   PCP: Shane Nguyen     Assessment/Plan: Active Problems:   Acute pancreatitis   35 yo gentleman with a history of HTN, GERD, and vitamin D deficiency who presents with acute pancreatitis. Etiology unclear at this point. He denies binge drinking; says he only consumed two alcoholic beverages at at a cook out yesterday. He reports that he only drinks 1-2 times per week. He denies a history of EtOH dependence or withdrawal. He is concerned about gallbladder disease because his mother has a history of cholelithiasis.CT A/p Acute pancreatitis without evidence of complication by CT. Minimal sigmoid diverticulosis. No gallstones or evidence of biliary obstruction   Assessment and plan   Acute alcoholic  Pancreatitis: Lipase 980 on admission, normal LFTs . AF VSS, WBC 11.3 CT abdomen showing peripancreatic edema without complication at this point No history of previous pancreatic flare. Patient is social ETOH drinker.  Continue IV fluids - NPO Lipid panel does not show hypertriglyceridemia, recheck lipase Hold  HCTZ, right upper quadrant ultrasound shows a small polyp no gallstones   Leukocytosis, likely reactive and recent steroids WBC 11.3 Afebrile  IVF  Cultures  Repeat CBC in AM   Hypertension BP   Controlled Continue meds   Hyperlipidemia Not on statin  Check Lipid panel   Hypokalemia, may be due to diuretics. Repleted, KCL IV  Repeat CMET in am   Pre Diabetes Current blood sugar level is 132  Recent Labs    Lab Results  Component Value Date   HGBA1C 5.0 09/06/2014    Hgb A1C Hold home oral diabetic medications.  Heart healthy carb modified diet.   Anxiety Continue Xanax         DVT prophylaxsis   Lovenox  Code Status:  Full code     Family Communication: Discussed in detail with the patient And his entire extended  family, all imaging results, lab results explained to the patient   Disposition Plan:   anticipate discharge in one to 2 days      Consultants:   None  Procedures:   None  Antibiotics: Anti-infectives    None         HPI/Subjective: Patient states that he is still having quite a lot of abdominal pain and requesting Dilaudid every 4 hours  Objective: Filed Vitals:   08/13/15 1129 08/13/15 1357 08/13/15 2214 08/14/15 0442  BP: 118/71 139/80 133/75 125/62  Pulse: 66 56 64 81  Temp:  98 F (36.7 C) 98.1 F (36.7 C) 98 F (36.7 C)  TempSrc:  Oral Oral Oral  Resp: 18 20 19 18   Height:      Weight:  94.9 kg (209 lb 3.5 oz) 96.3 kg (212 lb 4.9 oz)   SpO2: 100% 100% 100% 100%    Intake/Output Summary (Last 24 hours) at 08/14/15 1009 Last data filed at 08/14/15 0654  Gross per 24 hour  Intake 2056.67 ml  Output    650 ml  Net 1406.67 ml    Exam:  Examination:  General exam: Appears calm and comfortable  Respiratory system: Clear to auscultation. Respiratory effort normal. Cardiovascular system: S1 & S2 heard, RRR. No JVD, murmurs, rubs, gallops or clicks. No pedal edema. Gastrointestinal system: Abdomen is nondistended, soft and nontender. No organomegaly or masses felt. Normal bowel sounds heard. Central nervous system: Alert and oriented. No focal neurological deficits. Extremities: Symmetric 5 x 5 power. Skin:  No rashes, lesions or ulcers Psychiatry: Judgement and insight appear normal. Mood & affect appropriate.     Data Reviewed: I have personally reviewed following labs and imaging studies  Micro Results No results found for this or any previous visit (from the past 240 hour(s)).  Radiology Reports Ct Abdomen Pelvis W Contrast  08/13/2015  CLINICAL DATA:  Awoke with abdominal pain today, states he ate too much last night, intermittent pain, feels need to burp, history hypertension EXAM: CT ABDOMEN AND PELVIS WITH CONTRAST TECHNIQUE: Multidetector CT  imaging of the abdomen and pelvis was performed using the standard protocol following bolus administration of intravenous contrast. Sagittal and coronal MPR images reconstructed from axial data set. CONTRAST:  ISOVUE-300 IOPAMIDOL (ISOVUE-300) INJECTION 61% IV. Dilute oral contrast. COMPARISON:  None FINDINGS: Lower chest:  Lung bases clear Hepatobiliary: Liver and gallbladder normal appearance Pancreas: Peripancreatic edema extending into small bowel mesenteric and adjacent to hepatic flexure of colon compatible with acute pancreatitis. No definite pancreatic mass, calcification, ductal dilatation or necrosis. No discrete peripancreatic fluid collections. Spleen: Normal appearance Adrenals/Urinary Tract: Kidneys, ureters, bladder, and prostate gland normal appearance. Stomach/Bowel: Minimal sigmoid diverticulosis without evidence of diverticulitis. Stomach and bowel loops otherwise normal appearance. Vascular/Lymphatic: Aorta normal caliber. Major intra-abdominal vascular structures are patent, including portal vein, SMV and splenic vein. Scattered normal size retroperitoneal nodes without adenopathy. Reproductive: N/A Other: Small amount of free fluid dependently in pelvis. No free intraperitoneal air. Musculoskeletal: Osseous structures unremarkable. IMPRESSION: Acute pancreatitis without evidence of complication by CT. Minimal sigmoid diverticulosis. Electronically Signed   By: Ulyses Southward M.D.   On: 08/13/2015 10:31   US Abdomen Limited Ruq  08/13/2015  CLINICAL DATA:  Acute onset of generalized abdominal pain. Elevated lipase. Concern for pancreatitis. Initial encounter. EXAM: US ABDOMEN LIMITED - RIGHT UPPER QUADRANT COMPARISON:  CT of the abdomen pelvis performed earlier today at 10:21 a.m. FINDINGS: Gallbladder: No gallstones or wall thickening visualized. A 0.3 cm polyp is noted along the gallbladder wall. No sonographic Murphy sign noted by sonographer. Common bile duct: Diameter: 0.3 cm, within  normal limits in caliber. Liver: No focal lesion identified. Within normal limits in parenchymal echogenicity. IMPRESSION: 1. No acute abnormality seen at the right upper quadrant. 2. 0.3 cm polyp incidentally noted along the gallbladder wall. Gallbladder otherwise unremarkable. Electronically Signed   By: Roanna Raider M.D.   On: 08/13/2015 22:14     CBC  Recent Labs Lab 08/13/15 0943 08/14/15 0408  WBC 11.3* 15.1*  HGB 15.8 15.1  HCT 44.8 45.2  PLT 205 205  MCV 83.9 86.6  MCH 29.6 28.9  MCHC 35.3 33.4  RDW 12.1 12.2  LYMPHSABS 1.0  --   MONOABS 0.9  --   EOSABS 0.0  --   BASOSABS 0.0  --     Chemistries   Recent Labs Lab 08/13/15 0943 08/14/15 0408  NA 136 135  K 3.0* 3.9  CL 98* 99*  CO2 29 30  GLUCOSE 132* 104*  BUN 17 7  CREATININE 0.82 0.67  CALCIUM 9.1 8.9  AST 23 17  ALT 19 15*  ALKPHOS 51 43  BILITOT 0.7 1.2   ------------------------------------------------------------------------------------------------------------------ estimated creatinine clearance is 153.7 mL/min (by C-G formula based on Cr of 0.67). ------------------------------------------------------------------------------------------------------------------  Recent Labs  08/13/15 1517  HGBA1C 5.0   ------------------------------------------------------------------------------------------------------------------  Recent Labs  08/14/15 0408  CHOL 126  HDL 41  LDLCALC 76  TRIG 43  CHOLHDL 3.1   ------------------------------------------------------------------------------------------------------------------ No results for input(s): TSH, T4TOTAL,  T3FREE, THYROIDAB in the last 72 hours.  Invalid input(s): FREET3 ------------------------------------------------------------------------------------------------------------------ No results for input(s): VITAMINB12, FOLATE, FERRITIN, TIBC, IRON, RETICCTPCT in the last 72 hours.  Coagulation profile No results for input(s): INR, PROTIME  in the last 168 hours.  No results for input(s): DDIMER in the last 72 hours.  Cardiac Enzymes No results for input(s): CKMB, TROPONINI, MYOGLOBIN in the last 168 hours.  Invalid input(s): CK ------------------------------------------------------------------------------------------------------------------ Invalid input(s): POCBNP   CBG: No results for input(s): GLUCAP in the last 168 hours.     Studies: Ct Abdomen Pelvis W Contrast  08/13/2015  CLINICAL DATA:  Awoke with abdominal pain today, states he ate too much last night, intermittent pain, feels need to burp, history hypertension EXAM: CT ABDOMEN AND PELVIS WITH CONTRAST TECHNIQUE: Multidetector CT imaging of the abdomen and pelvis was performed using the standard protocol following bolus administration of intravenous contrast. Sagittal and coronal MPR images reconstructed from axial data set. CONTRAST:  100mL ISOVUE-300 IOPAMIDOL (ISOVUE-300) INJECTION 61% IV. Dilute oral contrast. COMPARISON:  None FINDINGS: Lower chest:  Lung bases clear Hepatobiliary: Liver and gallbladder normal appearance Pancreas: Peripancreatic edema extending into small bowel mesenteric and adjacent to hepatic flexure of colon compatible with acute pancreatitis. No definite pancreatic mass, calcification, ductal dilatation or necrosis. No discrete peripancreatic fluid collections. Spleen: Normal appearance Adrenals/Urinary Tract: Kidneys, ureters, bladder, and prostate gland normal appearance. Stomach/Bowel: Minimal sigmoid diverticulosis without evidence of diverticulitis. Stomach and bowel loops otherwise normal appearance. Vascular/Lymphatic: Aorta normal caliber. Major intra-abdominal vascular structures are patent, including portal vein, SMV and splenic vein. Scattered normal size retroperitoneal nodes without adenopathy. Reproductive: N/A Other: Small amount of free fluid dependently in pelvis. No free intraperitoneal air. Musculoskeletal: Osseous structures  unremarkable. IMPRESSION: Acute pancreatitis without evidence of complication by CT. Minimal sigmoid diverticulosis. Electronically Signed   By: Ulyses SouthwardMark  Boles M.D.   On: 08/13/2015 10:31   [place] Abdomen Limited Ruq  08/13/2015  CLINICAL DATA:  Acute onset of generalized abdominal pain. Elevated lipase. Concern for pancreatitis. Initial encounter. EXAM: US ABDOMEN LIMITED - RIGHT UPPER QUADRANT COMPARISON:  CT of the abdomen pelvis performed earlier today at 10:21 a.m. FINDINGS: Gallbladder: No gallstones or wall thickening visualized. A 0.3 cm polyp is noted along the gallbladder wall. No sonographic Murphy sign noted by sonographer. Common bile duct: Diameter: 0.3 cm, within normal limits in caliber. Liver: No focal lesion identified. Within normal limits in parenchymal echogenicity. IMPRESSION: 1. No acute abnormality seen at the right upper quadrant. 2. 0.3 cm polyp incidentally noted along the gallbladder wall. Gallbladder otherwise unremarkable. Electronically Signed   By: Roanna RaiderJeffery  Chang M.D.   On: 08/13/2015 22:14      Lab Results  Component Value Date   HGBA1C 5.0 08/13/2015   HGBA1C 5.2 09/06/2014   HGBA1C 5.5 02/24/2014   Lab Results  Component Value Date   MICROALBUR 0.8 09/06/2014   LDLCALC 76 08/14/2015   CREATININE 0.67 08/14/2015       Scheduled Meds: . enoxaparin (LOVENOX) injection  40 mg Subcutaneous Q24H   Continuous Infusions: . sodium chloride 100 mL/hr at 08/14/15 0053        Time spent: >30 MINS    [facility]-St PetersburgBROL,Grady Mohabir  [facility] Pager [phone]. If 7PM-7AM, please contact night-coverage at [email or web address], password [email or web address] 08/14/2015, 10:09 AM

## 2015-08-15 ENCOUNTER — Inpatient Hospital Stay (HOSPITAL_COMMUNITY): Payer: 59

## 2015-08-15 DIAGNOSIS — K8512 Biliary acute pancreatitis with infected necrosis: Secondary | ICD-10-CM

## 2015-08-15 DIAGNOSIS — K859 Acute pancreatitis without necrosis or infection, unspecified: Secondary | ICD-10-CM

## 2015-08-15 LAB — COMPREHENSIVE METABOLIC PANEL
ALT: 12 U/L — AB (ref 17–63)
AST: 22 U/L (ref 15–41)
Albumin: 2.9 g/dL — ABNORMAL LOW (ref 3.5–5.0)
Alkaline Phosphatase: 46 U/L (ref 38–126)
Anion gap: 8 (ref 5–15)
BILIRUBIN TOTAL: 1.7 mg/dL — AB (ref 0.3–1.2)
BUN: 8 mg/dL (ref 6–20)
CHLORIDE: 99 mmol/L — AB (ref 101–111)
CO2: 28 mmol/L (ref 22–32)
CREATININE: 0.76 mg/dL (ref 0.61–1.24)
Calcium: 8.9 mg/dL (ref 8.9–10.3)
Glucose, Bld: 106 mg/dL — ABNORMAL HIGH (ref 65–99)
POTASSIUM: 3.8 mmol/L (ref 3.5–5.1)
Sodium: 135 mmol/L (ref 135–145)
TOTAL PROTEIN: 6 g/dL — AB (ref 6.5–8.1)

## 2015-08-15 LAB — CBC
HCT: 44.7 % (ref 39.0–52.0)
Hemoglobin: 15 g/dL (ref 13.0–17.0)
MCH: 29.4 pg (ref 26.0–34.0)
MCHC: 33.6 g/dL (ref 30.0–36.0)
MCV: 87.5 fL (ref 78.0–100.0)
PLATELETS: 191 10*3/uL (ref 150–400)
RBC: 5.11 MIL/uL (ref 4.22–5.81)
RDW: 12.3 % (ref 11.5–15.5)
WBC: 17.5 10*3/uL — AB (ref 4.0–10.5)

## 2015-08-15 LAB — LIPASE, BLOOD: Lipase: 92 U/L — ABNORMAL HIGH (ref 11–51)

## 2015-08-15 MED ORDER — IOPAMIDOL (ISOVUE-300) INJECTION 61%
INTRAVENOUS | Status: DC
Start: 2015-08-15 — End: 2015-08-15
  Filled 2015-08-15: qty 100

## 2015-08-15 MED ORDER — DIATRIZOATE MEGLUMINE & SODIUM 66-10 % PO SOLN
ORAL | Status: AC
  Filled 2015-08-15: qty 30

## 2015-08-15 MED ORDER — DIATRIZOATE MEGLUMINE & SODIUM 66-10 % PO SOLN
30.0000 mL | Freq: Once | ORAL | Status: DC
  Filled 2015-08-15: qty 30

## 2015-08-15 MED ORDER — PIPERACILLIN-TAZOBACTAM 3.375 G IVPB
3.3750 g | Freq: Three times a day (TID) | INTRAVENOUS | Status: DC
  Administered 2015-08-15 – 2015-08-20 (×15): 3.375 g via INTRAVENOUS
  Filled 2015-08-15 (×16): qty 50

## 2015-08-15 NOTE — Progress Notes (Signed)
Pharmacy Antibiotic Note  Shane Nguyen is a 35 y.o. male admitted on 08/13/2015 with abdominal infection.  Pharmacy has been consulted for Zosyn dosing.  Plan: Zosyn 3.375g IV q8h (4 hour infusion).  Pharmacy to sign off  Height: 6' (182.9 cm) Weight: 212 lb 1.3 oz (96.2 kg) IBW/kg (Calculated) : 77.6  Temp (24hrs), Avg:99.3 F (37.4 C), Min:98.2 F (36.8 C), Max:100.2 F (37.9 C)   Recent Labs Lab 08/13/15 0943 08/14/15 0408 08/15/15 0521  WBC 11.3* 15.1* 17.5*  CREATININE 0.82 0.67 0.76    Estimated Creatinine Clearance: 153.5 mL/min (by C-G formula based on Cr of 0.76).    No Known Allergies   Thank you for allowing pharmacy to be a part of this patient's care. Okey RegalLisa Salbador Fiveash, PharmD [phone] 08/15/2015 4:40 PM

## 2015-08-15 NOTE — Consult Note (Signed)
[facility] Consult Note  Referring Provider: No ref. provider found Primary Care Physician:  Alesia Richards, MD Primary Gastroenterologist:  Dr.  Laurel Dimmer Complaint: Abdominal pain HPI: Shane Nguyen is an 35 y.o. white male  who presented 2 nights ago sudden onset of acute epigastric pain nausea with dry he is radiating to the back. He was found have lipase greater than 800 with normal liver function tests, CT the abdomen showing changes the pancreas consistent with pancreatitis with no biliary abnormalities. Abdominal ultrasound showed only a 3 mm gallbladder polyp. Patient admits to drinking too strong vodka drinks a night on Fridays and Saturdays and otherwise drinks very little. He said no history of hyperlipidemia. He is on hydrochlorothiazide which been on for about 2 years. He has had a rise in WBC count from 11,300-17,502 days. He has been afebrile.  Past Medical History  Diagnosis Date  . Hypertension   . Hemorrhoids   . Hyperlipidemia   . Vitamin D deficiency   . Insomnia     Past Surgical History  Procedure Laterality Date  . Anterior cruciate ligament repair      bilateral  . Knee arthroscopy Right 1997    murphy    Medications Prior to Admission  Medication Sig Dispense Refill  . ALPRAZolam (XANAX) 1 MG tablet TAKE ONE TABLET BY MOUTH THREE TIMES DAILY AS NEEDED FOR ANXIETY (Patient taking differently: Take 1 mg by mouth at bedtime) 90 tablet 0  . bisoprolol-hydrochlorothiazide (ZIAC) 5-6.25 MG tablet TAKE ONE TABLET BY MOUTH ONCE DAILY FOR BLOOD PRESSURE 90 tablet 1  . Melatonin 10 MG TABS Take 1 tablet by mouth at bedtime.    . multivitamin (ONE-A-DAY MEN'S) TABS tablet Take 1 tablet by mouth daily.    . psyllium (REGULOID) 0.52 g capsule Take 3.12 g by mouth daily.    . ranitidine (ZANTAC) 150 MG capsule Take 150 mg by mouth 2 (two) times daily. Reported on 08/13/2015    . Flaxseed, Linseed, (FLAXSEED OIL) 1000 MG CAPS Take by mouth. Reported on  08/13/2015      Allergies: No Known Allergies  Family History  Problem Relation Age of Onset  . Hypertension Father   . Hyperlipidemia Father     Social History:  reports that he quit smoking about 11 years ago. He has never used smokeless tobacco. He reports that he drinks alcohol. He reports that he does not use illicit drugs.  Review of Systems: negative except As above   Blood pressure 139/68, pulse 80, temperature 99.4 F (37.4 C), temperature source Oral, resp. rate 18, height 6' (1.829 m), weight 96.2 kg (212 lb 1.3 oz), SpO2 98 %. Head: Normocephalic, without obvious abnormality, atraumatic Neck: no adenopathy, no carotid bruit, no JVD, supple, symmetrical, trachea midline and thyroid not enlarged, symmetric, no tenderness/mass/nodules Resp: clear to auscultation bilaterally Cardio: regular rate and rhythm, S1, S2 normal, no murmur, click, rub or gallop GI: Abdomen somewhat firm nondistended with moderate epigastric tenderness Extremities: extremities normal, atraumatic, no cyanosis or edema  Results for orders placed or performed during the hospital encounter of 08/13/15 (from the past 48 hour(s))  Hemoglobin A1c     Status: None   Collection Time: 08/13/15  3:17 PM  Result Value Ref Range   Hgb A1c MFr Bld 5.0 4.8 - 5.6 %    Comment: (NOTE)         Pre-diabetes: 5.7 - 6.4         Diabetes: >6.4  Glycemic control for adults with diabetes: <7.0    Mean Plasma Glucose 97 mg/dL    Comment: (NOTE) Performed At: [facility] [address] [place], [place] [id number] Mila Homer MD OJ:[id number]   Comprehensive metabolic panel     Status: Abnormal   Collection Time: 08/14/15  4:08 AM  Result Value Ref Range   Sodium 135 135 - 145 mmol/L   Potassium 3.9 3.5 - 5.1 mmol/L   Chloride 99 (L) 101 - 111 mmol/L   CO2 30 22 - 32 mmol/L   Glucose, Bld 104 (H) 65 - 99 mg/dL   BUN 7 6 - 20 mg/dL   Creatinine, Ser 1.16 0.61 - 1.24 mg/dL   Calcium 8.9 8.9 -  54.6 mg/dL   Total Protein 6.4 (L) 6.5 - 8.1 g/dL   Albumin 3.5 3.5 - 5.0 g/dL   AST 17 15 - 41 U/L   ALT 15 (L) 17 - 63 U/L   Alkaline Phosphatase 43 38 - 126 U/L   Total Bilirubin 1.2 0.3 - 1.2 mg/dL   GFR calc non Af Amer >60 >60 mL/min   GFR calc Af Amer >60 >60 mL/min    Comment: (NOTE) The eGFR has been calculated using the CKD EPI equation. This calculation has not been validated in all clinical situations. eGFR's persistently <60 mL/min signify possible Chronic Kidney Disease.    Anion gap 6 5 - 15  CBC     Status: Abnormal   Collection Time: 08/14/15  4:08 AM  Result Value Ref Range   WBC 15.1 (H) 4.0 - 10.5 K/uL   RBC 5.22 4.22 - 5.81 MIL/uL   Hemoglobin 15.1 13.0 - 17.0 g/dL   HCT 12.4 32.7 - 55.6 %   MCV 86.6 78.0 - 100.0 fL   MCH 28.9 26.0 - 34.0 pg   MCHC 33.4 30.0 - 36.0 g/dL   RDW 23.9 21.5 - 15.8 %   Platelets 205 150 - 400 K/uL  Lipid panel     Status: None   Collection Time: 08/14/15  4:08 AM  Result Value Ref Range   Cholesterol 126 0 - 200 mg/dL   Triglycerides 43 <265 mg/dL   HDL 41 >87 mg/dL   Total CHOL/HDL Ratio 3.1 RATIO   VLDL 9 0 - 40 mg/dL   LDL Cholesterol 76 0 - 99 mg/dL    Comment:        Total Cholesterol/HDL:CHD Risk Coronary Heart Disease Risk Table                     Men   Women  1/2 Average Risk   3.4   3.3  Average Risk       5.0   4.4  2 X Average Risk   9.6   7.1  3 X Average Risk  23.4   11.0        Use the calculated Patient Ratio above and the CHD Risk Table to determine the patient's CHD Risk.        ATP III CLASSIFICATION (LDL):  <100     mg/dL   Optimal  184-108  mg/dL   Near or Above                    Optimal  130-159  mg/dL   Borderline  579-079  mg/dL   High  >310     mg/dL   Very High   Lipase, blood     Status: Abnormal  Collection Time: 08/14/15  4:08 AM  Result Value Ref Range   Lipase 629 (H) 11 - 51 U/L    Comment: RESULTS CONFIRMED BY MANUAL DILUTION  Comprehensive metabolic panel     Status:  Abnormal   Collection Time: 08/15/15  5:21 AM  Result Value Ref Range   Sodium 135 135 - 145 mmol/L   Potassium 3.8 3.5 - 5.1 mmol/L   Chloride 99 (L) 101 - 111 mmol/L   CO2 28 22 - 32 mmol/L   Glucose, Bld 106 (H) 65 - 99 mg/dL   BUN 8 6 - 20 mg/dL   Creatinine, Ser 0.76 0.61 - 1.24 mg/dL   Calcium 8.9 8.9 - 10.3 mg/dL   Total Protein 6.0 (L) 6.5 - 8.1 g/dL   Albumin 2.9 (L) 3.5 - 5.0 g/dL   AST 22 15 - 41 U/L   ALT 12 (L) 17 - 63 U/L   Alkaline Phosphatase 46 38 - 126 U/L   Total Bilirubin 1.7 (H) 0.3 - 1.2 mg/dL   GFR calc non Af Amer >60 >60 mL/min   GFR calc Af Amer >60 >60 mL/min    Comment: (NOTE) The eGFR has been calculated using the CKD EPI equation. This calculation has not been validated in all clinical situations. eGFR's persistently <60 mL/min signify possible Chronic Kidney Disease.    Anion gap 8 5 - 15  CBC     Status: Abnormal   Collection Time: 08/15/15  5:21 AM  Result Value Ref Range   WBC 17.5 (H) 4.0 - 10.5 K/uL   RBC 5.11 4.22 - 5.81 MIL/uL   Hemoglobin 15.0 13.0 - 17.0 g/dL   HCT 44.7 39.0 - 52.0 %   MCV 87.5 78.0 - 100.0 fL   MCH 29.4 26.0 - 34.0 pg   MCHC 33.6 30.0 - 36.0 g/dL   RDW 12.3 11.5 - 15.5 %   Platelets 191 150 - 400 K/uL  Lipase, blood     Status: Abnormal   Collection Time: 08/15/15  5:21 AM  Result Value Ref Range   Lipase 92 (H) 11 - 51 U/L   US Abdomen Limited Ruq  08/13/2015  CLINICAL DATA:  Acute onset of generalized abdominal pain. Elevated lipase. Concern for pancreatitis. Initial encounter. EXAM: US ABDOMEN LIMITED - RIGHT UPPER QUADRANT COMPARISON:  CT of the abdomen pelvis performed earlier today at 10:21 a.m. FINDINGS: Gallbladder: No gallstones or wall thickening visualized. A 0.3 cm polyp is noted along the gallbladder wall. No sonographic Murphy sign noted by sonographer. Common bile duct: Diameter: 0.3 cm, within normal limits in caliber. Liver: No focal lesion identified. Within normal limits in parenchymal  echogenicity. IMPRESSION: 1. No acute abnormality seen at the right upper quadrant. 2. 0.3 cm polyp incidentally noted along the gallbladder wall. Gallbladder otherwise unremarkable. Electronically Signed   By: Garald Balding M.D.   On: 08/13/2015 22:14    Assessment: Acute pancreatitis, related to alcohol, hydrochlorothiazide, or otherwise idiopathic. Plan:  Supportive care Allow clear liquid diet Monitor WBC count and other parameters of possible complications Consider repeat CT scanning in 3 or 4 days, if parameters of improvement are evident. Briya Lookabaugh C 08/15/2015, 11:25 AM  Pager [phone] If no answer or after 5 PM call [phone]

## 2015-08-15 NOTE — Progress Notes (Addendum)
[facility] PROGRESS NOTE  Shane AlpersJames Nguyen WUJ:[id number] DOB: 08/17/1980 DOA: 08/13/2015   PCP: [name],[name], MD     Assessment/Plan: Active Problems:   Acute pancreatitis   35 yo gentleman with a history of HTN, GERD, and vitamin D deficiency who presents with acute pancreatitis. Etiology unclear at this point. He denies binge drinking; says he only consumed two alcoholic beverages at at a cook out yesterday. He reports that he only drinks 1-2 times per week. He denies a history of EtOH dependence or withdrawal. He is concerned about gallbladder disease because his mother has a history of cholelithiasis.CT A/p Acute pancreatitis without evidence of complication by CT. Minimal sigmoid diverticulosis. No gallstones or evidence of biliary obstruction   Assessment and plan  Acute alcoholic  Pancreatitis:  Lipase 980 on admission, now 92, patient still continues to have significant abdominal pain normal LFTs except for mild increase in bilirubin today.  White count has increased from 11.3-17.5, concern for pancreatic necrosis Initial CT abdomen showing peripancreatic edema without complication at this point No history of previous pancreatic flare. Patient is social ETOH drinker.  Continue IV fluids at 1 50 mL/h - NPO Lipid panel does not show hypertriglyceridemia, recheck lipase Hold  HCTZ, right upper quadrant ultrasound shows a small polyp no gallstones Repeat CT abdomen pelvis to rule out pancreatic necrosis Family request GI consultation, they question whether antibiotics are indicated, I have clearly instructed them that the antibiotics will be initiated only there is evidence of pancreatic necrosis if okay with [facility]enter[facility], they recommended abx if pt spikes a fever and to do CT in 3-4 days to r/o complications   Leukocytosis, likely reactive and recent steroidswhite count has increased from 11.3-17.5  IVF   Cultures  Repeat CBC in AM   Hypertension BP   Controlled Continue meds   Hyperlipidemia Not on statin  Check Lipid panel   Hypokalemia, may be due to diuretics. Repleted, KCL IV  Repeat CMET in am   Pre Diabetes Current blood sugar level is 132  Recent Labs    Lab Results  Component Value Date   HGBA1C 5.0 09/06/2014    Hold home oral diabetic medications.  Heart healthy carb modified diet.   Anxiety Continue Xanax         DVT prophylaxsis   Lovenox  Code Status:  Full code     Family Communication: Discussed in detail with the patient And his entire extended family, all imaging results, lab results explained to the patient   Disposition Plan:   Anticipate discharge in 1-2 days pending GI consultation      [facility]  Procedures:   None  Antibiotics: Anti-infectives    None         HPI/Subjective: Patient endorses significant amount of abdominal pain, no significant improvement since admission  Objective: Filed Vitals:   08/14/15 1456 08/14/15 2129 08/15/15 0500 08/15/15 0618  BP: 130/71 140/71  139/68  Pulse: 82 78  80  Temp: 98.1 F (36.7 C) 98.2 F (36.8 C)  99.4 F (37.4 C)  TempSrc: Oral Oral    Resp: 18 18  18   Height:      Weight:   96.2 kg (212 lb 1.3 oz)   SpO2: 100% 100%  98%    Intake/Output Summary (Last 24 hours) at 08/15/15 1049 Last data filed at 08/15/15 1046  Gross per 24 hour  Intake   3265 ml  Output   2676 ml  Net    589 ml    Exam:  Examination:  General exam: Appears calm and comfortable  Respiratory system: Clear to auscultation. Respiratory effort normal. Cardiovascular system: S1 & S2 heard, RRR. No JVD, murmurs, rubs, gallops or clicks. No pedal edema. Gastrointestinal system: Abdomen is nondistended, soft and nontender. No organomegaly or masses felt. Normal bowel sounds heard. Central nervous system: Alert and oriented. No focal neurological  deficits. Extremities: Symmetric 5 x 5 power. Skin: No rashes, lesions or ulcers Psychiatry: Judgement and insight appear normal. Mood & affect appropriate.     Data Reviewed: I have personally reviewed following labs and imaging studies  Micro Results No results found for this or any previous visit (from the past 240 hour(s)).  Radiology Reports Ct Abdomen Pelvis W Contrast  08/13/2015  CLINICAL DATA:  Awoke with abdominal pain today, states he ate too much last night, intermittent pain, feels need to burp, history hypertension EXAM: CT ABDOMEN AND PELVIS WITH CONTRAST TECHNIQUE: Multidetector CT imaging of the abdomen and pelvis was performed using the standard protocol following bolus administration of intravenous contrast. Sagittal and coronal MPR images reconstructed from axial data set. CONTRAST:  ISOVUE-300 IOPAMIDOL (ISOVUE-300) INJECTION 61% IV. Dilute oral contrast. COMPARISON:  None FINDINGS: Lower chest:  Lung bases clear Hepatobiliary: Liver and gallbladder normal appearance Pancreas: Peripancreatic edema extending into small bowel mesenteric and adjacent to hepatic flexure of colon compatible with acute pancreatitis. No definite pancreatic mass, calcification, ductal dilatation or necrosis. No discrete peripancreatic fluid collections. Spleen: Normal appearance Adrenals/Urinary Tract: Kidneys, ureters, bladder, and prostate gland normal appearance. Stomach/Bowel: Minimal sigmoid diverticulosis without evidence of diverticulitis. Stomach and bowel loops otherwise normal appearance. Vascular/Lymphatic: Aorta normal caliber. Major intra-abdominal vascular structures are patent, including portal vein, SMV and splenic vein. Scattered normal size retroperitoneal nodes without adenopathy. Reproductive: N/A Other: Small amount of free fluid dependently in pelvis. No free intraperitoneal air. Musculoskeletal: Osseous structures unremarkable. IMPRESSION: Acute pancreatitis without evidence of  complication by CT. Minimal sigmoid diverticulosis. Electronically Signed   By: Ulyses Southward M.D.   On: 08/13/2015 10:31   US Abdomen Limited Ruq  08/13/2015  CLINICAL DATA:  Acute onset of generalized abdominal pain. Elevated lipase. Concern for pancreatitis. Initial encounter. EXAM: US ABDOMEN LIMITED - RIGHT UPPER QUADRANT COMPARISON:  CT of the abdomen pelvis performed earlier today at 10:21 a.m. FINDINGS: Gallbladder: No gallstones or wall thickening visualized. A 0.3 cm polyp is noted along the gallbladder wall. No sonographic Murphy sign noted by sonographer. Common bile duct: Diameter: 0.3 cm, within normal limits in caliber. Liver: No focal lesion identified. Within normal limits in parenchymal echogenicity. IMPRESSION: 1. No acute abnormality seen at the right upper quadrant. 2. 0.3 cm polyp incidentally noted along the gallbladder wall. Gallbladder otherwise unremarkable. Electronically Signed   By: Roanna Raider M.D.   On: 08/13/2015 22:14     CBC  Recent Labs Lab 08/13/15 0943 08/14/15 0408 08/15/15 0521  WBC 11.3* 15.1* 17.5*  HGB 15.8 15.1 15.0  HCT 44.8 45.2 44.7  PLT 205 205 191  MCV 83.9 86.6 87.5  MCH 29.6 28.9 29.4  MCHC 35.3 33.4 33.6  RDW 12.1 12.2 12.3  LYMPHSABS 1.0  --   --   MONOABS 0.9  --   --   EOSABS 0.0  --   --   BASOSABS 0.0  --   --     Chemistries   Recent Labs Lab 08/13/15 0943 08/14/15 0408 08/15/15  0521  NA 136 135 135  K 3.0* 3.9 3.8  CL 98* 99* 99*  CO2 29 30 28   GLUCOSE 132* 104* 106*  BUN 17 7 8   CREATININE 0.82 0.67 0.76  CALCIUM 9.1 8.9 8.9  AST 23 17 22   ALT 19 15* 12*  ALKPHOS 51 43 46  BILITOT 0.7 1.2 1.7*   ------------------------------------------------------------------------------------------------------------------ estimated creatinine clearance is 153.5 mL/min (by C-G formula based on Cr of  0.76). ------------------------------------------------------------------------------------------------------------------  Recent Labs  08/13/15 1517  HGBA1C 5.0   ------------------------------------------------------------------------------------------------------------------  Recent Labs  08/14/15 0408  CHOL 126  HDL 41  LDLCALC 76  TRIG 43  CHOLHDL 3.1   ------------------------------------------------------------------------------------------------------------------ No results for input(s): TSH, T4TOTAL, T3FREE, THYROIDAB in the last 72 hours.  Invalid input(s): FREET3 ------------------------------------------------------------------------------------------------------------------ No results for input(s): VITAMINB12, FOLATE, FERRITIN, TIBC, IRON, RETICCTPCT in the last 72 hours.  Coagulation profile No results for input(s): INR, PROTIME in the last 168 hours.  No results for input(s): DDIMER in the last 72 hours.  Cardiac Enzymes No results for input(s): CKMB, TROPONINI, MYOGLOBIN in the last 168 hours.  Invalid input(s): CK ------------------------------------------------------------------------------------------------------------------ Invalid input(s): POCBNP   CBG: No results for input(s): GLUCAP in the last 168 hours.     Studies: [place] Abdomen Limited Ruq  08/13/2015  CLINICAL DATA:  Acute onset of generalized abdominal pain. Elevated lipase. Concern for pancreatitis. Initial encounter. EXAM: US ABDOMEN LIMITED - RIGHT UPPER QUADRANT COMPARISON:  CT of the abdomen pelvis performed earlier today at 10:21 a.m. FINDINGS: Gallbladder: No gallstones or wall thickening visualized. A 0.3 cm polyp is noted along the gallbladder wall. No sonographic Murphy sign noted by sonographer. Common bile duct: Diameter: 0.3 cm, within normal limits in caliber. Liver: No focal lesion identified. Within normal limits in parenchymal echogenicity. IMPRESSION: 1. No acute abnormality  seen at the right upper quadrant. 2. 0.3 cm polyp incidentally noted along the gallbladder wall. Gallbladder otherwise unremarkable. Electronically Signed   By: Roanna RaiderJeffery  Chang M.D.   On: 08/13/2015 22:14      Lab Results  Component Value Date   HGBA1C 5.0 08/13/2015   HGBA1C 5.2 09/06/2014   HGBA1C 5.5 02/24/2014   Lab Results  Component Value Date   MICROALBUR 0.8 09/06/2014   LDLCALC 76 08/14/2015   CREATININE 0.76 08/15/2015       Scheduled Meds: . enoxaparin (LOVENOX) injection  40 mg Subcutaneous Q24H  . folic acid  1 mg Oral Daily  . multivitamin with minerals  1 tablet Oral Daily  . thiamine  100 mg Oral Daily   Or  . thiamine  100 mg Intravenous Daily   Continuous Infusions: . sodium chloride 150 mL/hr at 08/15/15 0906     LOS: 1 day    Time spent: >30 MINS    [facility] Debar[facility] CampusBROL,Billal Rollo  [facility] Pager [phone]. If 7PM-7AM, please contact night-coverage at [email or web address], password [email or web address] 08/15/2015, 10:49 AM  LOS: 1 day

## 2015-08-16 DIAGNOSIS — K852 Alcohol induced acute pancreatitis without necrosis or infection: Secondary | ICD-10-CM | POA: Insufficient documentation

## 2015-08-16 DIAGNOSIS — K860 Alcohol-induced chronic pancreatitis: Secondary | ICD-10-CM

## 2015-08-16 LAB — CBC
HCT: 41.7 % (ref 39.0–52.0)
Hemoglobin: 13.8 g/dL (ref 13.0–17.0)
MCH: 29.1 pg (ref 26.0–34.0)
MCHC: 33.1 g/dL (ref 30.0–36.0)
MCV: 87.8 fL (ref 78.0–100.0)
PLATELETS: 195 10*3/uL (ref 150–400)
RBC: 4.75 MIL/uL (ref 4.22–5.81)
RDW: 12.4 % (ref 11.5–15.5)
WBC: 13.2 10*3/uL — AB (ref 4.0–10.5)

## 2015-08-16 LAB — COMPREHENSIVE METABOLIC PANEL
ALT: 12 U/L — AB (ref 17–63)
AST: 23 U/L (ref 15–41)
Albumin: 2.6 g/dL — ABNORMAL LOW (ref 3.5–5.0)
Alkaline Phosphatase: 49 U/L (ref 38–126)
Anion gap: 5 (ref 5–15)
BUN: 8 mg/dL (ref 6–20)
CHLORIDE: 100 mmol/L — AB (ref 101–111)
CO2: 31 mmol/L (ref 22–32)
CREATININE: 0.79 mg/dL (ref 0.61–1.24)
Calcium: 8.7 mg/dL — ABNORMAL LOW (ref 8.9–10.3)
GFR calc Af Amer: >60 mL/min (ref >60)
GFR calc non Af Amer: >60 mL/min (ref >60)
Glucose, Bld: 107 mg/dL — ABNORMAL HIGH (ref 65–99)
Potassium: 3.8 mmol/L (ref 3.5–5.1)
SODIUM: 136 mmol/L (ref 135–145)
Total Bilirubin: 1.5 mg/dL — ABNORMAL HIGH (ref 0.3–1.2)
Total Protein: 5.7 g/dL — ABNORMAL LOW (ref 6.5–8.1)

## 2015-08-16 LAB — DIFFERENTIAL
BASOS ABS: 0 10*3/uL (ref 0.0–0.1)
BASOS PCT: 0 %
EOS ABS: 0 10*3/uL (ref 0.0–0.7)
EOS PCT: 0 %
Lymphocytes Relative: 5 %
Lymphs Abs: 0.7 10*3/uL (ref 0.7–4.0)
MONO ABS: 0.7 10*3/uL (ref 0.1–1.0)
MONOS PCT: 5 %
Neutro Abs: 11.9 10*3/uL — ABNORMAL HIGH (ref 1.7–7.7)
Neutrophils Relative %: 90 %

## 2015-08-16 LAB — LIPASE, BLOOD
LIPASE: 29 U/L (ref 11–51)
Lipase: 34 U/L (ref 11–51)

## 2015-08-16 NOTE — Progress Notes (Signed)
[facility] Progress Note  Subjective: Abdominal pain slightly better, trying to wean from dilaudid to Vicodin Objective: Vital signs in last 24 hours: Temp:  [98.7 F (37.1 C)-100.6 F (38.1 C)] 99.7 F (37.6 C) (07/06 0550) Pulse Rate:  [82-97] 97 (07/06 0550) Resp:  [18] 18 (07/06 0550) BP: (139-155)/(72-83) 155/83 mmHg (07/06 0550) SpO2:  [98 %-100 %] 98 % (07/06 0550) Weight:  [96.4 kg (212 lb 8.4 oz)] 96.4 kg (212 lb 8.4 oz) (07/06 0550) Weight change: 0.2 kg (7.1 oz)   PE: Abdomen flat, slightly less tender  Lab Results: Results for orders placed or performed during the hospital encounter of 08/13/15 (from the past 24 hour(s))  Culture, blood (Routine X 2) w Reflex to ID Panel     Status: None (Preliminary result)   Collection Time: 08/15/15  6:25 PM  Result Value Ref Range   Specimen Description BLOOD LEFT ANTECUBITAL    Special Requests BOTTLES DRAWN AEROBIC AND ANAEROBIC 5CC EACH    Culture PENDING    Report Status PENDING   CBC     Status: Abnormal   Collection Time: 08/16/15  4:25 AM  Result Value Ref Range   WBC 13.2 (H) 4.0 - 10.5 K/uL   RBC 4.75 4.22 - 5.81 MIL/uL   Hemoglobin 13.8 13.0 - 17.0 g/dL   HCT 16.141.7 09.639.0 - 04.552.0 %   MCV 87.8 78.0 - 100.0 fL   MCH 29.1 26.0 - 34.0 pg   MCHC 33.1 30.0 - 36.0 g/dL   RDW 40.912.4 81.111.5 - 91.415.5 %   Platelets 195 150 - 400 K/uL  Comprehensive metabolic panel     Status: Abnormal   Collection Time: 08/16/15  4:25 AM  Result Value Ref Range   Sodium 136 135 - 145 mmol/L   Potassium 3.8 3.5 - 5.1 mmol/L   Chloride 100 (L) 101 - 111 mmol/L   CO2 31 22 - 32 mmol/L   Glucose, Bld 107 (H) 65 - 99 mg/dL   BUN 8 6 - 20 mg/dL   Creatinine, Ser 7.820.79 0.61 - 1.24 mg/dL   Calcium 8.7 (L) 8.9 - 10.3 mg/dL   Total Protein 5.7 (L) 6.5 - 8.1 g/dL   Albumin 2.6 (L) 3.5 - 5.0 g/dL   AST 23 15 - 41 U/L   ALT 12 (L) 17 - 63 U/L   Alkaline Phosphatase 49 38 - 126 U/L   Total Bilirubin 1.5 (H) 0.3 - 1.2 mg/dL   GFR calc non Af  Amer >60 >60 mL/min   GFR calc Af Amer >60 >60 mL/min   Anion gap 5 5 - 15  Lipase, blood     Status: None   Collection Time: 08/16/15  4:25 AM  Result Value Ref Range   Lipase 34 11 - 51 U/L  Differential     Status: Abnormal   Collection Time: 08/16/15  4:25 AM  Result Value Ref Range   Neutrophils Relative % 90 %   Neutro Abs 11.9 (H) 1.7 - 7.7 K/uL   Lymphocytes Relative 5 %   Lymphs Abs 0.7 0.7 - 4.0 K/uL   Monocytes Relative 5 %   Monocytes Absolute 0.7 0.1 - 1.0 K/uL   Eosinophils Relative 0 %   Eosinophils Absolute 0.0 0.0 - 0.7 K/uL   Basophils Relative 0 %   Basophils Absolute 0.0 0.0 - 0.1 K/uL    Studies/Results: Dg Chest Port 1 View  08/15/2015  CLINICAL DATA:  Fever 1 day EXAM: PORTABLE CHEST 1 VIEW  COMPARISON:  None FINDINGS: Exam is lordotic. Normal mediastinum and cardiac silhouette. Normal pulmonary vasculature. No evidence of effusion, infiltrate, or pneumothorax. No acute bony abnormality. IMPRESSION: No acute cardiopulmonary process. Electronically Signed   By: Genevive BiStewart  Edmunds M.D.   On: 08/15/2015 17:41      Assessment: Pancreatitis, alcohol-related versus HCTZ versus idiopathic/microlithiasis, parameters of adverse course improving.  Plan: Antibiotics started based on yesterday's low-grade fever and leukocytosis, if continued improvement may be able to stop in a day or 2. Continue supportive care Hold at clear liquids today Recheck labs in the morning.    Arthelia Callicott C 08/16/2015, 9:08 AM  Pager [phone] If no answer or after 5 PM call [phone]

## 2015-08-16 NOTE — Progress Notes (Signed)
[facility] PROGRESS NOTE  Michaeljoseph Revolorio ZDG:[id number] DOB: 1980-05-08 DOA: 08/13/2015   PCP: Nadean Corwin, MD     Assessment/Plan: Active Problems:   Acute pancreatitis   35 yo gentleman with a history of HTN, GERD, and vitamin D deficiency who presents with acute pancreatitis. Etiology unclear at this point. He denies binge drinking; says he only consumed two alcoholic beverages at at a cook out yesterday. He reports that he only drinks 1-2 times per week. He denies a history of EtOH dependence or withdrawal. He is concerned about gallbladder disease because his mother has a history of cholelithiasis.CT A/p Acute pancreatitis without evidence of complication by CT. Minimal sigmoid diverticulosis. No gallstones or evidence of biliary obstruction   Assessment and plan  Acute alcoholic  Pancreatitis:  Lipase 980 on admission, now 34, somewhat better abdominal pain  normal LFTs except for mild increase in bilirubin today.  White count has increased from 11.3-17.5, concern for pancreatic necrosis, white count improved after starting on Zosyn on 7/5, GI recommends to discontinue Zosyn as soon as possible Initial CT abdomen showing peripancreatic edema without complication at this point No history of previous pancreatic flare. Patient is social ETOH drinker.  Continue IV fluids at 1 50 mL/h - NPO Lipid panel does not show hypertriglyceridemia, recheck lipase Hold  HCTZ, right upper quadrant ultrasound shows a small polyp no gallstones Repeat CT abdomen pelvis to rule out pancreatic necrosis or as recommended by GI [facility] has been consulted, they recommended abx if pt spikes a fever and to do CT in 3-4 days to r/o complications   Leukocytosis, likely reactive and recent steroidswhite count has increased from 11.3-17.5, slowly improving  IVF  Cultures  Repeat CBC in AM   Hypertension BP   Controlled Continue meds   Hyperlipidemia Not  on statin  Triglycerides 43, LDL 76  Hypokalemia, may be due to diuretics. Repleted, KCL IV  Repeat CMET in am   Pre Diabetes Current blood sugar level is 132  Recent Labs    Lab Results  Component Value Date   HGBA1C 5.0 09/06/2014    Hold home oral diabetic medications.  Heart healthy carb modified diet.   Anxiety Continue Xanax         DVT prophylaxsis   Lovenox  Code Status:  Full code     Family Communication: Discussed in detail with the patient And his entire extended family, all imaging results, lab results explained to the patient   Disposition Plan:   Anticipate discharge When clinically improved     Consultants:  [facility]  Procedures:   None  Antibiotics: Anti-infectives    Start     Dose/Rate Route Frequency Ordered Stop   08/15/15 1800  piperacillin-tazobactam (ZOSYN) IVPB 3.375 g     3.375 g 12.5 mL/hr over 240 Minutes Intravenous Every 8 hours 08/15/15 1640           HPI/Subjective: Patient endorses reduced amount of abdominal pain,    Objective: Filed Vitals:   08/15/15 1506 08/15/15 1834 08/15/15 2219 08/16/15 0550  BP: 145/79  139/72 155/83  Pulse: 86  82 97  Temp: 100.2 F (37.9 C) 100.6 F (38.1 C) 98.7 F (37.1 C) 99.7 F (37.6 C)  TempSrc: Oral Oral Oral Oral  Resp: Height:      Weight:    96.4 kg (212 lb 8.4 oz)  SpO2: 100%  99% 98%    Intake/Output Summary (  Last 24 hours) at 08/16/15 1216 Last data filed at 08/16/15 1046  Gross per 24 hour  Intake 4687.5 ml  Output   1925 ml  Net 2762.5 ml    Exam:  Examination:  General exam: Appears calm and comfortable  Respiratory system: Clear to auscultation. Respiratory effort normal. Cardiovascular system: S1 & S2 heard, RRR. No JVD, murmurs, rubs, gallops or clicks. No pedal edema. Gastrointestinal system: Abdomen is nondistended, soft and nontender. No organomegaly or masses felt. Normal bowel sounds heard. Central nervous  system: Alert and oriented. No focal neurological deficits. Extremities: Symmetric 5 x 5 power. Skin: No rashes, lesions or ulcers Psychiatry: Judgement and insight appear normal. Mood & affect appropriate.     Data Reviewed: I have personally reviewed following labs and imaging studies  Micro Results Recent Results (from the past 240 hour(s))  Culture, blood (Routine X 2) w Reflex to ID Panel     Status: None (Preliminary result)   Collection Time: 08/15/15  6:25 PM  Result Value Ref Range Status   Specimen Description BLOOD LEFT ANTECUBITAL  Final   Special Requests BOTTLES DRAWN AEROBIC AND ANAEROBIC 5CC EACH  Final   Culture PENDING  Incomplete   Report Status PENDING  Incomplete    Radiology Reports Ct Abdomen Pelvis W Contrast  08/13/2015  CLINICAL DATA:  Awoke with abdominal pain today, states he ate too much last night, intermittent pain, feels need to burp, history hypertension EXAM: CT ABDOMEN AND PELVIS WITH CONTRAST TECHNIQUE: Multidetector CT imaging of the abdomen and pelvis was performed using the standard protocol following bolus administration of intravenous contrast. Sagittal and coronal MPR images reconstructed from axial data set. CONTRAST:  ISOVUE-300 IOPAMIDOL (ISOVUE-300) INJECTION 61% IV. Dilute oral contrast. COMPARISON:  None FINDINGS: Lower chest:  Lung bases clear Hepatobiliary: Liver and gallbladder normal appearance Pancreas: Peripancreatic edema extending into small bowel mesenteric and adjacent to hepatic flexure of colon compatible with acute pancreatitis. No definite pancreatic mass, calcification, ductal dilatation or necrosis. No discrete peripancreatic fluid collections. Spleen: Normal appearance Adrenals/Urinary Tract: Kidneys, ureters, bladder, and prostate gland normal appearance. Stomach/Bowel: Minimal sigmoid diverticulosis without evidence of diverticulitis. Stomach and bowel loops otherwise normal appearance. Vascular/Lymphatic: Aorta normal  caliber. Major intra-abdominal vascular structures are patent, including portal vein, SMV and splenic vein. Scattered normal size retroperitoneal nodes without adenopathy. Reproductive: N/A Other: Small amount of free fluid dependently in pelvis. No free intraperitoneal air. Musculoskeletal: Osseous structures unremarkable. IMPRESSION: Acute pancreatitis without evidence of complication by CT. Minimal sigmoid diverticulosis. Electronically Signed   By: Ulyses Southward M.D.   On: 08/13/2015 10:31   Dg Chest Port 1 View  08/15/2015  CLINICAL DATA:  Fever 1 day EXAM: PORTABLE CHEST 1 VIEW COMPARISON:  None FINDINGS: Exam is lordotic. Normal mediastinum and cardiac silhouette. Normal pulmonary vasculature. No evidence of effusion, infiltrate, or pneumothorax. No acute bony abnormality. IMPRESSION: No acute cardiopulmonary process. Electronically Signed   By: Genevive Bi M.D.   On: 08/15/2015 17:41   US Abdomen Limited Ruq  08/13/2015  CLINICAL DATA:  Acute onset of generalized abdominal pain. Elevated lipase. Concern for pancreatitis. Initial encounter. EXAM: US ABDOMEN LIMITED - RIGHT UPPER QUADRANT COMPARISON:  CT of the abdomen pelvis performed earlier today at 10:21 a.m. FINDINGS: Gallbladder: No gallstones or wall thickening visualized. A 0.3 cm polyp is noted along the gallbladder wall. No sonographic Murphy sign noted by sonographer. Common bile duct: Diameter: 0.3 cm, within normal limits in caliber. Liver: No focal lesion identified. Within  normal limits in parenchymal echogenicity. IMPRESSION: 1. No acute abnormality seen at the right upper quadrant. 2. 0.3 cm polyp incidentally noted along the gallbladder wall. Gallbladder otherwise unremarkable. Electronically Signed   By: Roanna RaiderJeffery  Chang M.D.   On: 08/13/2015 22:14     CBC  Recent Labs Lab 08/13/15 0943 08/14/15 0408 08/15/15 0521 08/16/15 0425  WBC 11.3* 15.1* 17.5* 13.2*  HGB 15.8 15.1 15.0 13.8  HCT 44.8 45.2 44.7 41.7  PLT 205 205 191  195  MCV 83.9 86.6 87.5 87.8  MCH 29.6 28.9 29.4 29.1  MCHC 35.3 33.4 33.6 33.1  RDW 12.1 12.2 12.3 12.4  LYMPHSABS 1.0  --   --  0.7  MONOABS 0.9  --   --  0.7  EOSABS 0.0  --   --  0.0  BASOSABS 0.0  --   --  0.0    Chemistries   Recent Labs Lab 08/13/15 0943 08/14/15 0408 08/15/15 0521 08/16/15 0425  NA 136 135 135 136  K 3.0* 3.9 3.8 3.8  CL 98* 99* 99* 100*  CO2 29 30 28 31   GLUCOSE 132* 104* 106* 107*  BUN 17 7 8 8   CREATININE 0.82 0.67 0.76 0.79  CALCIUM 9.1 8.9 8.9 8.7*  AST 23 17 22 23   ALT 19 15* 12* 12*  ALKPHOS 51 43 46 49  BILITOT 0.7 1.2 1.7* 1.5*   ------------------------------------------------------------------------------------------------------------------ estimated creatinine clearance is 153.7 mL/min (by C-G formula based on Cr of 0.79). ------------------------------------------------------------------------------------------------------------------  Recent Labs  08/13/15 1517  HGBA1C 5.0   ------------------------------------------------------------------------------------------------------------------  Recent Labs  08/14/15 0408  CHOL 126  HDL 41  LDLCALC 76  TRIG 43  CHOLHDL 3.1   ------------------------------------------------------------------------------------------------------------------ No results for input(s): TSH, T4TOTAL, T3FREE, THYROIDAB in the last 72 hours.  Invalid input(s): FREET3 ------------------------------------------------------------------------------------------------------------------ No results for input(s): VITAMINB12, FOLATE, FERRITIN, TIBC, IRON, RETICCTPCT in the last 72 hours.  Coagulation profile No results for input(s): INR, PROTIME in the last 168 hours.  No results for input(s): DDIMER in the last 72 hours.  Cardiac Enzymes No results for input(s): CKMB, TROPONINI, MYOGLOBIN in the last 168 hours.  Invalid input(s):  CK ------------------------------------------------------------------------------------------------------------------ Invalid input(s): POCBNP   CBG: No results for input(s): GLUCAP in the last 168 hours.     Studies: Dg Chest Port 1 View  08/15/2015  CLINICAL DATA:  Fever 1 day EXAM: PORTABLE CHEST 1 VIEW COMPARISON:  None FINDINGS: Exam is lordotic. Normal mediastinum and cardiac silhouette. Normal pulmonary vasculature. No evidence of effusion, infiltrate, or pneumothorax. No acute bony abnormality. IMPRESSION: No acute cardiopulmonary process. Electronically Signed   By: Genevive BiStewart  Edmunds M.D.   On: 08/15/2015 17:41      Lab Results  Component Value Date   HGBA1C 5.0 08/13/2015   HGBA1C 5.2 09/06/2014   HGBA1C 5.5 02/24/2014   Lab Results  Component Value Date   MICROALBUR 0.8 09/06/2014   LDLCALC 76 08/14/2015   CREATININE 0.79 08/16/2015       Scheduled Meds: . diatrizoate meglumine-sodium  30 mL Oral Once  . enoxaparin (LOVENOX) injection  40 mg Subcutaneous Q24H  . folic acid  1 mg Oral Daily  . multivitamin with minerals  1 tablet Oral Daily  . piperacillin-tazobactam (ZOSYN)  IV  3.375 g Intravenous Q8H  . thiamine  100 mg Oral Daily   Continuous Infusions: . sodium chloride 150 mL/hr at 08/16/15 0900     LOS: 2 days    Time spent: >30 MINS    [facility] CarrolltonBROL,Safire Gordin  [facility] Pager [phone]. If 7PM-7AM, please contact night-coverage at [email or web address], password [email or web address] 08/16/2015, 12:16 PM  LOS: 2 days

## 2015-08-17 ENCOUNTER — Inpatient Hospital Stay (HOSPITAL_COMMUNITY): Payer: 59

## 2015-08-17 DIAGNOSIS — K8500 Idiopathic acute pancreatitis without necrosis or infection: Secondary | ICD-10-CM

## 2015-08-17 LAB — COMPREHENSIVE METABOLIC PANEL
ALK PHOS: 50 U/L (ref 38–126)
ALT: 14 U/L — ABNORMAL LOW (ref 17–63)
AST: 22 U/L (ref 15–41)
Albumin: 2.4 g/dL — ABNORMAL LOW (ref 3.5–5.0)
Anion gap: 5 (ref 5–15)
BILIRUBIN TOTAL: 1.3 mg/dL — AB (ref 0.3–1.2)
CALCIUM: 8.5 mg/dL — AB (ref 8.9–10.3)
CO2: 31 mmol/L (ref 22–32)
CREATININE: 0.7 mg/dL (ref 0.61–1.24)
Chloride: 101 mmol/L (ref 101–111)
GFR calc Af Amer: >60 mL/min (ref >60)
GLUCOSE: 95 mg/dL (ref 65–99)
Potassium: 3.3 mmol/L — ABNORMAL LOW (ref 3.5–5.1)
SODIUM: 137 mmol/L (ref 135–145)
TOTAL PROTEIN: 5.8 g/dL — AB (ref 6.5–8.1)

## 2015-08-17 MED ORDER — POTASSIUM CHLORIDE IN NACL 40-0.9 MEQ/L-% IV SOLN
INTRAVENOUS | Status: DC
  Administered 2015-08-17 – 2015-08-19 (×5): 100 mL/h via INTRAVENOUS
  Filled 2015-08-17 (×6): qty 1000

## 2015-08-17 MED ORDER — DIATRIZOATE MEGLUMINE & SODIUM 66-10 % PO SOLN
ORAL | Status: AC
  Administered 2015-08-17: 1 mL
  Filled 2015-08-17: qty 30

## 2015-08-17 MED ORDER — IOPAMIDOL (ISOVUE-300) INJECTION 61%
INTRAVENOUS | Status: AC
  Administered 2015-08-17: 100 mL
  Filled 2015-08-17: qty 100

## 2015-08-17 NOTE — Progress Notes (Signed)
Pt requested to speak to the MD, gastroenterologist paged and he called and spoke to pt and his family

## 2015-08-17 NOTE — Progress Notes (Signed)
[facility] PROGRESS NOTE  Davius Goudeau VHQ:[id number] DOB: 06/14/1980 DOA: 08/13/2015   PCP: Nadean Corwin, MD     Assessment/Plan: Active Problems:   Acute pancreatitis   Alcoholic pancreatitis   35 yo gentleman with a history of HTN, GERD, and vitamin D deficiency who presents with acute pancreatitis. Etiology unclear at this point. He denies binge drinking; says he only consumed two alcoholic beverages at at a cook out yesterday. He reports that he only drinks 1-2 times per week. He denies a history of EtOH dependence or withdrawal. He is concerned about gallbladder disease because his mother has a history of cholelithiasis.CT A/p Acute pancreatitis without evidence of complication by CT. Minimal sigmoid diverticulosis. No gallstones or evidence of biliary obstruction   Assessment and plan  Acute alcoholic  Pancreatitis: evolving phlegmon/fluid collections Lipase 980 on admission, now 34, somewhat better abdominal pain  normal LFTs except for mild increase in bilirubin today.  White count has increased from 11.3-17.5, concern for pancreatic necrosis, white count improved after starting on Zosyn on 7/5, GI recommends to discontinue Zosyn over the next 2-3 days    Patient is social ETOH drinker.  Continue IV fluids -normal saline with potassium Started clear liquid diet 7/6 Lipid panel does not show hypertriglyceridemia, recheck lipase Hold  HCTZ as a possible culprit, right upper quadrant ultrasound shows a small polyp no gallstones [facility] has been consulted, they recommended to continue with antibiotics Will need serial CT scan, continue supportive care   Leukocytosis, likely reactive and recent steroidswhite count has increased from 11.3-17.5, slowly improving  IVF  Cultures  Repeat CBC in AM   Hypertension BP   Controlled Continue meds   Hyperlipidemia Not on statin  Triglycerides 43, LDL 76  Hypokalemia, may be due to  diuretics. Repleted, KCL IV  Repeat CMET in am   Pre Diabetes Current blood sugar level is 132  Recent Labs    Lab Results  Component Value Date   HGBA1C 5.0 09/06/2014    Hold home oral diabetic medications.  Heart healthy carb modified diet.   Anxiety Continue Xanax         DVT prophylaxsis   Lovenox  Code Status:  Full code     Family Communication: Discussed in detail with the patient And his entire extended family, all imaging results, lab results explained to the patient   Disposition Plan:   Anticipate discharge When clinically improved     Consultants:  [facility]  Procedures:   None  Antibiotics: Anti-infectives    Start     Dose/Rate Route Frequency Ordered Stop   08/15/15 1800  piperacillin-tazobactam (ZOSYN) IVPB 3.375 g     3.375 g 12.5 mL/hr over 240 Minutes Intravenous Every 8 hours 08/15/15 1640           HPI/Subjective: Still having pain , but tolerating clear liquids, all questions answered  Objective: Filed Vitals:   08/16/15 1314 08/16/15 2125 08/17/15 0500 08/17/15 0525  BP: 141/81 143/75  149/90  Pulse: 85 88  89  Temp: 98.2 F (36.8 C) 99.1 F (37.3 C)  98.9 F (37.2 C)  TempSrc: Oral Oral  Oral  Resp: 18     Height:      Weight:   95.7 kg (210 lb 15.7 oz)   SpO2: 100% 97%  96%    Intake/Output Summary (Last 24 hours) at 08/17/15 1215 Last data filed at 08/17/15 1211  Gross per 24 hour  Intake  3825 ml  Output   3400 ml  Net    425 ml    Exam:  Examination:  General exam: Appears calm and comfortable  Respiratory system: Clear to auscultation. Respiratory effort normal. Cardiovascular system: S1 & S2 heard, RRR. No JVD, murmurs, rubs, gallops or clicks. No pedal edema. Gastrointestinal system: Abdomen is nondistended, soft and nontender. No organomegaly or masses felt. Normal bowel sounds heard. Central nervous system: Alert and oriented. No focal neurological deficits. Extremities:  Symmetric 5 x 5 power. Skin: No rashes, lesions or ulcers Psychiatry: Judgement and insight appear normal. Mood & affect appropriate.     Data Reviewed: I have personally reviewed following labs and imaging studies  Micro Results Recent Results (from the past 240 hour(s))  Culture, blood (Routine X 2) w Reflex to ID Panel     Status: None (Preliminary result)   Collection Time: 08/15/15  4:56 PM  Result Value Ref Range Status   Specimen Description BLOOD LEFT FOREARM  Final   Special Requests BOTTLES DRAWN AEROBIC AND ANAEROBIC 5CC  Final   Culture NO GROWTH < 24 HOURS  Final   Report Status PENDING  Incomplete  Culture, blood (Routine X 2) w Reflex to ID Panel     Status: None (Preliminary result)   Collection Time: 08/15/15  6:25 PM  Result Value Ref Range Status   Specimen Description BLOOD LEFT ANTECUBITAL  Final   Special Requests BOTTLES DRAWN AEROBIC AND ANAEROBIC 5CC   Final   Culture NO GROWTH < 24 HOURS  Final   Report Status PENDING  Incomplete    Radiology Reports Ct Abdomen Pelvis W Contrast  08/17/2015  CLINICAL DATA:  Acute onset of fever and generalized abdominal pain. Recently diagnosed pancreatitis. Initial encounter. EXAM: CT ABDOMEN AND PELVIS WITH CONTRAST TECHNIQUE: Multidetector CT imaging of the abdomen and pelvis was performed using the standard protocol following bolus administration of intravenous contrast. CONTRAST:  100mL ISOVUE-300 IOPAMIDOL (ISOVUE-300) INJECTION 61% COMPARISON:  CT of the abdomen and pelvis, and right upper quadrant ultrasound, performed 08/13/2015 FINDINGS: Trace bilateral pleural fluid is noted, with bibasilar atelectasis. There has been interval worsening in the appearance of the patient's pancreatitis, with diffusely increased inflammation tracking about the majority of the pancreas, and new marked phlegmon tracking inferiorly along the small bowel mesentery to the level of the mid abdomen. No definite pseudocyst formation is seen at  this time. There is no obvious devascularization of the pancreas. The liver and spleen are unremarkable in appearance. The gallbladder is within normal limits. The adrenal glands are unremarkable in appearance. The kidneys are unremarkable in appearance. There is no evidence of hydronephrosis. No renal or ureteral stones are seen. Minimal nonspecific perinephric stranding is noted bilaterally. No free fluid is identified. The small bowel is unremarkable in appearance. The stomach is within normal limits. No acute vascular abnormalities are seen. The appendix is normal in caliber and contains air, without evidence of appendicitis. The colon is grossly unremarkable in appearance. The bladder is mildly distended and grossly unremarkable. The prostate is borderline normal in size. No inguinal lymphadenopathy is seen. No acute osseous abnormalities are identified. IMPRESSION: 1. Interval worsening in appearance of pancreatitis, with diffusely increased inflammation tracking about the majority of the pancreas, and new marked phlegmon tracking inferiorly along the small bowel mesentery to the level of the mid abdomen. No definite devascularization or pseudocyst formation seen at this time. 2. Trace bilateral pleural fluid, with bibasilar atelectasis. Electronically Signed   By: Leotis ShamesJeffery  Chang M.D.   On: 08/17/2015 04:57   Ct Abdomen Pelvis W Contrast  08/13/2015  CLINICAL DATA:  Awoke with abdominal pain today, states he ate too much last night, intermittent pain, feels need to burp, history hypertension EXAM: CT ABDOMEN AND PELVIS WITH CONTRAST TECHNIQUE: Multidetector CT imaging of the abdomen and pelvis was performed using the standard protocol following bolus administration of intravenous contrast. Sagittal and coronal MPR images reconstructed from axial data set. CONTRAST:  ISOVUE-300 IOPAMIDOL (ISOVUE-300) INJECTION 61% IV. Dilute oral contrast. COMPARISON:  None FINDINGS: Lower chest:  Lung bases clear  Hepatobiliary: Liver and gallbladder normal appearance Pancreas: Peripancreatic edema extending into small bowel mesenteric and adjacent to hepatic flexure of colon compatible with acute pancreatitis. No definite pancreatic mass, calcification, ductal dilatation or necrosis. No discrete peripancreatic fluid collections. Spleen: Normal appearance Adrenals/Urinary Tract: Kidneys, ureters, bladder, and prostate gland normal appearance. Stomach/Bowel: Minimal sigmoid diverticulosis without evidence of diverticulitis. Stomach and bowel loops otherwise normal appearance. Vascular/Lymphatic: Aorta normal caliber. Major intra-abdominal vascular structures are patent, including portal vein, SMV and splenic vein. Scattered normal size retroperitoneal nodes without adenopathy. Reproductive: N/A Other: Small amount of free fluid dependently in pelvis. No free intraperitoneal air. Musculoskeletal: Osseous structures unremarkable. IMPRESSION: Acute pancreatitis without evidence of complication by CT. Minimal sigmoid diverticulosis. Electronically Signed   By: Ulyses Southward M.D.   On: 08/13/2015 10:31   Dg Chest Port 1 View  08/15/2015  CLINICAL DATA:  Fever 1 day EXAM: PORTABLE CHEST 1 VIEW COMPARISON:  None FINDINGS: Exam is lordotic. Normal mediastinum and cardiac silhouette. Normal pulmonary vasculature. No evidence of effusion, infiltrate, or pneumothorax. No acute bony abnormality. IMPRESSION: No acute cardiopulmonary process. Electronically Signed   By: Genevive Bi M.D.   On: 08/15/2015 17:41   US Abdomen Limited Ruq  08/13/2015  CLINICAL DATA:  Acute onset of generalized abdominal pain. Elevated lipase. Concern for pancreatitis. Initial encounter. EXAM: US ABDOMEN LIMITED - RIGHT UPPER QUADRANT COMPARISON:  CT of the abdomen pelvis performed earlier today at 10:21 a.m. FINDINGS: Gallbladder: No gallstones or wall thickening visualized. A 0.3 cm polyp is noted along the gallbladder wall. No sonographic Murphy sign  noted by sonographer. Common bile duct: Diameter: 0.3 cm, within normal limits in caliber. Liver: No focal lesion identified. Within normal limits in parenchymal echogenicity. IMPRESSION: 1. No acute abnormality seen at the right upper quadrant. 2. 0.3 cm polyp incidentally noted along the gallbladder wall. Gallbladder otherwise unremarkable. Electronically Signed   By: Roanna Raider M.D.   On: 08/13/2015 22:14     CBC  Recent Labs Lab 08/13/15 0943 08/14/15 0408 08/15/15 0521 08/16/15 0425  WBC 11.3* 15.1* 17.5* 13.2*  HGB 15.8 15.1 15.0 13.8  HCT 44.8 45.2 44.7 41.7  PLT 205 205 191 195  MCV 83.9 86.6 87.5 87.8  MCH 29.6 28.9 29.4 29.1  MCHC 35.3 33.4 33.6 33.1  RDW 12.1 12.2 12.3 12.4  LYMPHSABS 1.0  --   --  0.7  MONOABS 0.9  --   --  0.7  EOSABS 0.0  --   --  0.0  BASOSABS 0.0  --   --  0.0    Chemistries   Recent Labs Lab 08/13/15 0943 08/14/15 0408 08/15/15 0521 08/16/15 0425 08/17/15 0708  NA 136 135 135 136 137  K 3.0* 3.9 3.8 3.8 3.3*  CL 98* 99* 99* 100* 101  CO2 GLUCOSE 132* 104* 106* 107* 95  BUN <  5*  CREATININE 0.82 0.67 0.76 0.79 0.70  CALCIUM 9.1 8.9 8.9 8.7* 8.5*  AST 23 17 22 23 22   ALT 19 15* 12* 12* 14*  ALKPHOS 51 43 46 49 50  BILITOT 0.7 1.2 1.7* 1.5* 1.3*   ------------------------------------------------------------------------------------------------------------------ estimated creatinine clearance is 153.1 mL/min (by C-G formula based on Cr of 0.7). ------------------------------------------------------------------------------------------------------------------ No results for input(s): HGBA1C in the last 72 hours. ------------------------------------------------------------------------------------------------------------------ No results for input(s): CHOL, HDL, LDLCALC, TRIG, CHOLHDL, LDLDIRECT in the last 72  hours. ------------------------------------------------------------------------------------------------------------------ No results for input(s): TSH, T4TOTAL, T3FREE, THYROIDAB in the last 72 hours.  Invalid input(s): FREET3 ------------------------------------------------------------------------------------------------------------------ No results for input(s): VITAMINB12, FOLATE, FERRITIN, TIBC, IRON, RETICCTPCT in the last 72 hours.  Coagulation profile No results for input(s): INR, PROTIME in the last 168 hours.  No results for input(s): DDIMER in the last 72 hours.  Cardiac Enzymes No results for input(s): CKMB, TROPONINI, MYOGLOBIN in the last 168 hours.  Invalid input(s): CK ------------------------------------------------------------------------------------------------------------------ Invalid input(s): POCBNP   CBG: No results for input(s): GLUCAP in the last 168 hours.     Studies: Ct Abdomen Pelvis W Contrast  08/17/2015  CLINICAL DATA:  Acute onset of fever and generalized abdominal pain. Recently diagnosed pancreatitis. Initial encounter. EXAM: CT ABDOMEN AND PELVIS WITH CONTRAST TECHNIQUE: Multidetector CT imaging of the abdomen and pelvis was performed using the standard protocol following bolus administration of intravenous contrast. CONTRAST:  ISOVUE-300 IOPAMIDOL (ISOVUE-300) INJECTION 61% COMPARISON:  CT of the abdomen and pelvis, and right upper quadrant ultrasound, performed 08/13/2015 FINDINGS: Trace bilateral pleural fluid is noted, with bibasilar atelectasis. There has been interval worsening in the appearance of the patient's pancreatitis, with diffusely increased inflammation tracking about the majority of the pancreas, and new marked phlegmon tracking inferiorly along the small bowel mesentery to the level of the mid abdomen. No definite pseudocyst formation is seen at this time. There is no obvious devascularization of the pancreas. The liver and  spleen are unremarkable in appearance. The gallbladder is within normal limits. The adrenal glands are unremarkable in appearance. The kidneys are unremarkable in appearance. There is no evidence of hydronephrosis. No renal or ureteral stones are seen. Minimal nonspecific perinephric stranding is noted bilaterally. No free fluid is identified. The small bowel is unremarkable in appearance. The stomach is within normal limits. No acute vascular abnormalities are seen. The appendix is normal in caliber and contains air, without evidence of appendicitis. The colon is grossly unremarkable in appearance. The bladder is mildly distended and grossly unremarkable. The prostate is borderline normal in size. No inguinal lymphadenopathy is seen. No acute osseous abnormalities are identified. IMPRESSION: 1. Interval worsening in appearance of pancreatitis, with diffusely increased inflammation tracking about the majority of the pancreas, and new marked phlegmon tracking inferiorly along the small bowel mesentery to the level of the mid abdomen. No definite devascularization or pseudocyst formation seen at this time. 2. Trace bilateral pleural fluid, with bibasilar atelectasis. Electronically Signed   By: Roanna Raider M.D.   On: 08/17/2015 04:57   Dg Chest Port 1 View  08/15/2015  CLINICAL DATA:  Fever 1 day EXAM: PORTABLE CHEST 1 VIEW COMPARISON:  None FINDINGS: Exam is lordotic. Normal mediastinum and cardiac silhouette. Normal pulmonary vasculature. No evidence of effusion, infiltrate, or pneumothorax. No acute bony abnormality. IMPRESSION: No acute cardiopulmonary process. Electronically Signed   By: Genevive Bi M.D.   On: 08/15/2015 17:41      Lab Results  Component Value Date   HGBA1C 5.0 08/13/2015  HGBA1C 5.2 09/06/2014   HGBA1C 5.5 02/24/2014   Lab Results  Component Value Date   MICROALBUR 0.8 09/06/2014   LDLCALC 76 08/14/2015   CREATININE 0.70 08/17/2015       Scheduled Meds: .  diatrizoate meglumine-sodium  30 mL Oral Once  . enoxaparin (LOVENOX) injection  40 mg Subcutaneous Q24H  . folic acid  1 mg Oral Daily  . multivitamin with minerals  1 tablet Oral Daily  . piperacillin-tazobactam (ZOSYN)  IV  3.375 g Intravenous Q8H  . thiamine  100 mg Oral Daily   Continuous Infusions: . sodium chloride 150 mL/hr at 08/17/15 1211     LOS: 3 days    Time spent: >30 MINS    [facility],Pierre Cumpton  [facility] Pager [phone]. If 7PM-7AM, please contact night-coverage at [email or web address], password [email or web address] 08/17/2015, 12:15 PM  LOS: 3 days

## 2015-08-17 NOTE — Progress Notes (Signed)
[facility] Progress Note  Subjective: Degree of abdominal pain similar to somewhat improved from yesterday tolerating clear liquid diet, no more fever  Objective: Vital signs in last 24 hours: Temp:  [98.2 F (36.8 C)-99.1 F (37.3 C)] 98.9 F (37.2 C) (07/07 0525) Pulse Rate:  [85-89] 89 (07/07 0525) Resp:  [18] 18 (07/06 1314) BP: (141-149)/(75-90) 149/90 mmHg (07/07 0525) SpO2:  [96 %-100 %] 96 % (07/07 0525) Weight:  [95.7 kg (210 lb 15.7 oz)] 95.7 kg (210 lb 15.7 oz) (07/07 0500) Weight change: -0.7 kg (-1 lb 8.7 oz)   PE: Abdomen flat mild epigastric tenderness  Lab Results: Results for orders placed or performed during the hospital encounter of 08/13/15 (from the past 24 hour(s))  Lipase, blood     Status: None   Collection Time: 08/16/15 12:49 PM  Result Value Ref Range   Lipase 29 11 - 51 U/L  Comprehensive metabolic panel     Status: Abnormal   Collection Time: 08/17/15  7:08 AM  Result Value Ref Range   Sodium 137 135 - 145 mmol/L   Potassium 3.3 (L) 3.5 - 5.1 mmol/L   Chloride 101 101 - 111 mmol/L   CO2 31 22 - 32 mmol/L   Glucose, Bld 95 65 - 99 mg/dL   BUN <5 (L) 6 - 20 mg/dL   Creatinine, Ser 2.950.70 0.61 - 1.24 mg/dL   Calcium 8.5 (L) 8.9 - 10.3 mg/dL   Total Protein 5.8 (L) 6.5 - 8.1 g/dL   Albumin 2.4 (L) 3.5 - 5.0 g/dL   AST 22 15 - 41 U/L   ALT 14 (L) 17 - 63 U/L   Alkaline Phosphatase 50 38 - 126 U/L   Total Bilirubin 1.3 (H) 0.3 - 1.2 mg/dL   GFR calc non Af Amer >60 >60 mL/min   GFR calc Af Amer >60 >60 mL/min   Anion gap 5 5 - 15    Studies/Results: Ct Abdomen Pelvis W Contrast  08/17/2015  CLINICAL DATA:  Acute onset of fever and generalized abdominal pain. Recently diagnosed pancreatitis. Initial encounter. EXAM: CT ABDOMEN AND PELVIS WITH CONTRAST TECHNIQUE: Multidetector CT imaging of the abdomen and pelvis was performed using the standard protocol following bolus administration of intravenous contrast. CONTRAST:  100mL ISOVUE-300  IOPAMIDOL (ISOVUE-300) INJECTION 61% COMPARISON:  CT of the abdomen and pelvis, and right upper quadrant ultrasound, performed 08/13/2015 FINDINGS: Trace bilateral pleural fluid is noted, with bibasilar atelectasis. There has been interval worsening in the appearance of the patient's pancreatitis, with diffusely increased inflammation tracking about the majority of the pancreas, and new marked phlegmon tracking inferiorly along the small bowel mesentery to the level of the mid abdomen. No definite pseudocyst formation is seen at this time. There is no obvious devascularization of the pancreas. The liver and spleen are unremarkable in appearance. The gallbladder is within normal limits. The adrenal glands are unremarkable in appearance. The kidneys are unremarkable in appearance. There is no evidence of hydronephrosis. No renal or ureteral stones are seen. Minimal nonspecific perinephric stranding is noted bilaterally. No free fluid is identified. The small bowel is unremarkable in appearance. The stomach is within normal limits. No acute vascular abnormalities are seen. The appendix is normal in caliber and contains air, without evidence of appendicitis. The colon is grossly unremarkable in appearance. The bladder is mildly distended and grossly unremarkable. The prostate is borderline normal in size. No inguinal lymphadenopathy is seen. No acute osseous abnormalities are identified. IMPRESSION: 1. Interval worsening in appearance  of pancreatitis, with diffusely increased inflammation tracking about the majority of the pancreas, and new marked phlegmon tracking inferiorly along the small bowel mesentery to the level of the mid abdomen. No definite devascularization or pseudocyst formation seen at this time. 2. Trace bilateral pleural fluid, with bibasilar atelectasis. Electronically Signed   By: Roanna RaiderJeffery  Chang M.D.   On: 08/17/2015 04:57   Dg Chest Port 1 View  08/15/2015  CLINICAL DATA:  Fever 1 day EXAM:  PORTABLE CHEST 1 VIEW COMPARISON:  None FINDINGS: Exam is lordotic. Normal mediastinum and cardiac silhouette. Normal pulmonary vasculature. No evidence of effusion, infiltrate, or pneumothorax. No acute bony abnormality. IMPRESSION: No acute cardiopulmonary process. Electronically Signed   By: Genevive BiStewart  Edmunds M.D.   On: 08/15/2015 17:41      Assessment: Pancreatitis with evolving phlegmon/fluid collections  Plan: Continue antibiotics for now but if remains afebrile with no leukocytosis and no clinical signs of worsening consider discontinuing in 2-3 days Will need serial CT scanning Continue supportive care and slow advancement of diet    Porfiria Heinrich C 08/17/2015, 11:12 AM  Pager [phone] If no answer or after 5 PM call [phone]

## 2015-08-18 LAB — CBC WITH DIFFERENTIAL/PLATELET
BASOS ABS: 0 10*3/uL (ref 0.0–0.1)
Basophils Absolute: 0 10*3/uL (ref 0.0–0.1)
Basophils Relative: 0 %
Basophils Relative: 0 %
EOS PCT: 1 %
EOS PCT: 1 %
Eosinophils Absolute: 0.1 10*3/uL (ref 0.0–0.7)
Eosinophils Absolute: 0.1 10*3/uL (ref 0.0–0.7)
HCT: 36.5 % — ABNORMAL LOW (ref 39.0–52.0)
HEMATOCRIT: 36.5 % — AB (ref 39.0–52.0)
Hemoglobin: 12 g/dL — ABNORMAL LOW (ref 13.0–17.0)
Hemoglobin: 12.2 g/dL — ABNORMAL LOW (ref 13.0–17.0)
LYMPHS ABS: 0.7 10*3/uL (ref 0.7–4.0)
LYMPHS ABS: 1.2 10*3/uL (ref 0.7–4.0)
LYMPHS PCT: 5 %
LYMPHS PCT: 9 %
MCH: 28.9 pg (ref 26.0–34.0)
MCH: 29.3 pg (ref 26.0–34.0)
MCHC: 32.9 g/dL (ref 30.0–36.0)
MCHC: 33.4 g/dL (ref 30.0–36.0)
MCV: 88 fL (ref 78.0–100.0)
MCV: 87.5 fL (ref 78.0–100.0)
MONO ABS: 1.4 10*3/uL — AB (ref 0.1–1.0)
MONO ABS: 0.8 10*3/uL (ref 0.1–1.0)
MONOS PCT: 12 %
MONOS PCT: 7 %
NEUTROS ABS: 10.4 10*3/uL — AB (ref 1.7–7.7)
Neutro Abs: 9.9 10*3/uL — ABNORMAL HIGH (ref 1.7–7.7)
Neutrophils Relative %: 82 %
Neutrophils Relative %: 83 %
PLATELETS: 195 10*3/uL (ref 150–400)
PLATELETS: 203 10*3/uL (ref 150–400)
RBC: 4.15 MIL/uL — ABNORMAL LOW (ref 4.22–5.81)
RBC: 4.17 MIL/uL — ABNORMAL LOW (ref 4.22–5.81)
RDW: 12.5 % (ref 11.5–15.5)
RDW: 12.5 % (ref 11.5–15.5)
WBC: 12.1 10*3/uL — ABNORMAL HIGH (ref 4.0–10.5)
WBC: 12.5 10*3/uL — ABNORMAL HIGH (ref 4.0–10.5)

## 2015-08-18 LAB — COMPREHENSIVE METABOLIC PANEL
ALT: 15 U/L — ABNORMAL LOW (ref 17–63)
ANION GAP: 5 (ref 5–15)
AST: 20 U/L (ref 15–41)
Albumin: 2.3 g/dL — ABNORMAL LOW (ref 3.5–5.0)
Alkaline Phosphatase: 54 U/L (ref 38–126)
BILIRUBIN TOTAL: 1.2 mg/dL (ref 0.3–1.2)
BUN: 5 mg/dL — AB (ref 6–20)
CO2: 32 mmol/L (ref 22–32)
Calcium: 8.7 mg/dL — ABNORMAL LOW (ref 8.9–10.3)
Chloride: 100 mmol/L — ABNORMAL LOW (ref 101–111)
Creatinine, Ser: 0.69 mg/dL (ref 0.61–1.24)
GFR calc Af Amer: >60 mL/min (ref >60)
GFR calc non Af Amer: >60 mL/min (ref >60)
GLUCOSE: 107 mg/dL — AB (ref 65–99)
POTASSIUM: 3.7 mmol/L (ref 3.5–5.1)
Sodium: 137 mmol/L (ref 135–145)
TOTAL PROTEIN: 5.8 g/dL — AB (ref 6.5–8.1)

## 2015-08-18 MED ORDER — PANCRELIPASE (LIP-PROT-AMYL) 12000-38000 UNITS PO CPEP
36000.0000 [IU] | ORAL_CAPSULE | Freq: Three times a day (TID) | ORAL | Status: DC
  Administered 2015-08-18 – 2015-08-20 (×6): 36000 [IU] via ORAL
  Filled 2015-08-18 (×6): qty 3

## 2015-08-18 MED ORDER — POLYETHYLENE GLYCOL 3350 17 G PO PACK
17.0000 g | PACK | Freq: Two times a day (BID) | ORAL | Status: DC
  Administered 2015-08-18 – 2015-08-20 (×4): 17 g via ORAL
  Filled 2015-08-18 (×5): qty 1

## 2015-08-18 NOTE — Progress Notes (Signed)
[facility] PROGRESS NOTE  Shane Nguyen ZOX:[id number] DOB: 11/06/1980 DOA: 08/13/2015   PCP: [name],[name], MD     Assessment/Plan: Active Problems:   Acute pancreatitis   Alcoholic pancreatitis   35 yo gentleman with a history of HTN, GERD, and vitamin D deficiency who presents with acute pancreatitis. Etiology unclear at this point. He denies binge drinking; says he only consumed two alcoholic beverages at at a cook out yesterday. He reports that he only drinks 1-2 times per week. He denies a history of EtOH dependence or withdrawal. He is concerned about gallbladder disease because his mother has a history of cholelithiasis.CT A/p Acute pancreatitis without evidence of complication by CT. Minimal sigmoid diverticulosis. No gallstones or evidence of biliary obstruction   Assessment and plan  Acute alcoholic  Pancreatitis: evolving phlegmon/fluid collections Lipase 980 on admission, now 34, somewhat better abdominal pain  Bilirubin trending down White count has increased from 11.3-17.5, concern for pancreatic necrosis, white count improved after starting on Zosyn on 7/5, white blood cell count is 12.1 today GI recommends to discontinue Zosyn if continued improvement within the next 1-2 days Initial CT abdomen showing peripancreatic edema without complication at this point No history of previous pancreatic flare. Patient is social ETOH drinker.  Continue IV fluids -normal saline with potassium Started clear liquid diet 7/6 Lipid panel does not show hypertriglyceridemia, recheck lipase Hold  HCTZ as a possible culprit, right upper quadrant ultrasound shows a small polyp no gallstones [facility] has been consulted, they recommended to continue with antibiotics Will need serial CT scan, timing of the next CAT scan to be determined by GI, dr buccini to make further recommendations    Leukocytosis, likely reactive and recent steroidswhite count has  increased from 11.3-17.5, now 12.1,  slowly improving  IVF  Cultures  Repeat CBC in AM   Hypertension BP   Controlled Continue meds   Hyperlipidemia Not on statin  Triglycerides 43, LDL 76  Hypokalemia, may be due to diuretics. Repleted, KCL IV  Repeat CMET in am   Hyperglycemia Current blood sugar level is 132 Hemoglobin A1c 5.0   Anxiety Continue Xanax         DVT prophylaxsis   Lovenox  Code Status:  Full code     Family Communication: Discussed in detail with the patient And his entire extended family, all imaging results, lab results explained to the patient   Disposition Plan:   Anticipate discharge When clinically improved     Consultants:  [facility]  Procedures:   None  Antibiotics: Anti-infectives    Start     Dose/Rate Route Frequency Ordered Stop   08/15/15 1800  piperacillin-tazobactam (ZOSYN) IVPB 3.375 g     3.375 g 12.5 mL/hr over 240 Minutes Intravenous Every 8 hours 08/15/15 1640           HPI/Subjective: Still having pain , but tolerating clear liquids, all questions answered  Objective: Filed Vitals:   08/17/15 0525 08/17/15 1527 08/17/15 2232 08/18/15 0519  BP: 149/90 146/80 122/67 130/70  Pulse: 89 83 88 76  Temp: 98.9 F (37.2 C) 97.8 F (36.6 C) 98.4 F (36.9 C) 98.4 F (36.9 C)  TempSrc: Oral Oral Oral Oral  Resp:  17    Height:      Weight:      SpO2: 96% 97% 97% 96%    Intake/Output Summary (Last 24 hours) at 08/18/15 1124 Last data filed at 08/18/15 0903  Gross per  24 hour  Intake 1956.67 ml  Output   2650 ml  Net -693.33 ml    Exam:  Examination:  General exam: Appears calm and comfortable  Respiratory system: Clear to auscultation. Respiratory effort normal. Cardiovascular system: S1 & S2 heard, RRR. No JVD, murmurs, rubs, gallops or clicks. No pedal edema. Gastrointestinal system: Abdomen is nondistended, soft and nontender. No organomegaly or masses felt. Normal bowel  sounds heard. Central nervous system: Alert and oriented. No focal neurological deficits. Extremities: Symmetric 5 x 5 power. Skin: No rashes, lesions or ulcers Psychiatry: Judgement and insight appear normal. Mood & affect appropriate.     Data Reviewed: I have personally reviewed following labs and imaging studies  Micro Results Recent Results (from the past 240 hour(s))  Culture, blood (Routine X 2) w Reflex to ID Panel     Status: None (Preliminary result)   Collection Time: 08/15/15  4:56 PM  Result Value Ref Range Status   Specimen Description BLOOD LEFT FOREARM  Final   Special Requests BOTTLES DRAWN AEROBIC AND ANAEROBIC 5CC  Final   Culture NO GROWTH 3 DAYS  Final   Report Status PENDING  Incomplete  Culture, blood (Routine X 2) w Reflex to ID Panel     Status: None (Preliminary result)   Collection Time: 08/15/15  6:25 PM  Result Value Ref Range Status   Specimen Description BLOOD LEFT ANTECUBITAL  Final   Special Requests BOTTLES DRAWN AEROBIC AND ANAEROBIC 5CC   Final   Culture NO GROWTH 3 DAYS  Final   Report Status PENDING  Incomplete    Radiology Reports Ct Abdomen Pelvis W Contrast  08/17/2015  CLINICAL DATA:  Acute onset of fever and generalized abdominal pain. Recently diagnosed pancreatitis. Initial encounter. EXAM: CT ABDOMEN AND PELVIS WITH CONTRAST TECHNIQUE: Multidetector CT imaging of the abdomen and pelvis was performed using the standard protocol following bolus administration of intravenous contrast. CONTRAST:  ISOVUE-300 IOPAMIDOL (ISOVUE-300) INJECTION 61% COMPARISON:  CT of the abdomen and pelvis, and right upper quadrant ultrasound, performed 08/13/2015 FINDINGS: Trace bilateral pleural fluid is noted, with bibasilar atelectasis. There has been interval worsening in the appearance of the patient's pancreatitis, with diffusely increased inflammation tracking about the majority of the pancreas, and new marked phlegmon tracking inferiorly along the small  bowel mesentery to the level of the mid abdomen. No definite pseudocyst formation is seen at this time. There is no obvious devascularization of the pancreas. The liver and spleen are unremarkable in appearance. The gallbladder is within normal limits. The adrenal glands are unremarkable in appearance. The kidneys are unremarkable in appearance. There is no evidence of hydronephrosis. No renal or ureteral stones are seen. Minimal nonspecific perinephric stranding is noted bilaterally. No free fluid is identified. The small bowel is unremarkable in appearance. The stomach is within normal limits. No acute vascular abnormalities are seen. The appendix is normal in caliber and contains air, without evidence of appendicitis. The colon is grossly unremarkable in appearance. The bladder is mildly distended and grossly unremarkable. The prostate is borderline normal in size. No inguinal lymphadenopathy is seen. No acute osseous abnormalities are identified. IMPRESSION: 1. Interval worsening in appearance of pancreatitis, with diffusely increased inflammation tracking about the majority of the pancreas, and new marked phlegmon tracking inferiorly along the small bowel mesentery to the level of the mid abdomen. No definite devascularization or pseudocyst formation seen at this time. 2. Trace bilateral pleural fluid, with bibasilar atelectasis. Electronically Signed   By: Roanna Raider  M.D.   On: 08/17/2015 04:57   Ct Abdomen Pelvis W Contrast  08/13/2015  CLINICAL DATA:  Awoke with abdominal pain today, states he ate too much last night, intermittent pain, feels need to burp, history hypertension EXAM: CT ABDOMEN AND PELVIS WITH CONTRAST TECHNIQUE: Multidetector CT imaging of the abdomen and pelvis was performed using the standard protocol following bolus administration of intravenous contrast. Sagittal and coronal MPR images reconstructed from axial data set. CONTRAST:  ISOVUE-300 IOPAMIDOL (ISOVUE-300) INJECTION  61% IV. Dilute oral contrast. COMPARISON:  None FINDINGS: Lower chest:  Lung bases clear Hepatobiliary: Liver and gallbladder normal appearance Pancreas: Peripancreatic edema extending into small bowel mesenteric and adjacent to hepatic flexure of colon compatible with acute pancreatitis. No definite pancreatic mass, calcification, ductal dilatation or necrosis. No discrete peripancreatic fluid collections. Spleen: Normal appearance Adrenals/Urinary Tract: Kidneys, ureters, bladder, and prostate gland normal appearance. Stomach/Bowel: Minimal sigmoid diverticulosis without evidence of diverticulitis. Stomach and bowel loops otherwise normal appearance. Vascular/Lymphatic: Aorta normal caliber. Major intra-abdominal vascular structures are patent, including portal vein, SMV and splenic vein. Scattered normal size retroperitoneal nodes without adenopathy. Reproductive: N/A Other: Small amount of free fluid dependently in pelvis. No free intraperitoneal air. Musculoskeletal: Osseous structures unremarkable. IMPRESSION: Acute pancreatitis without evidence of complication by CT. Minimal sigmoid diverticulosis. Electronically Signed   By: Ulyses Southward M.D.   On: 08/13/2015 10:31   Dg Chest Port 1 View  08/15/2015  CLINICAL DATA:  Fever 1 day EXAM: PORTABLE CHEST 1 VIEW COMPARISON:  None FINDINGS: Exam is lordotic. Normal mediastinum and cardiac silhouette. Normal pulmonary vasculature. No evidence of effusion, infiltrate, or pneumothorax. No acute bony abnormality. IMPRESSION: No acute cardiopulmonary process. Electronically Signed   By: Genevive Bi M.D.   On: 08/15/2015 17:41   US Abdomen Limited Ruq  08/13/2015  CLINICAL DATA:  Acute onset of generalized abdominal pain. Elevated lipase. Concern for pancreatitis. Initial encounter. EXAM: US ABDOMEN LIMITED - RIGHT UPPER QUADRANT COMPARISON:  CT of the abdomen pelvis performed earlier today at 10:21 a.m. FINDINGS: Gallbladder: No gallstones or wall thickening  visualized. A 0.3 cm polyp is noted along the gallbladder wall. No sonographic Murphy sign noted by sonographer. Common bile duct: Diameter: 0.3 cm, within normal limits in caliber. Liver: No focal lesion identified. Within normal limits in parenchymal echogenicity. IMPRESSION: 1. No acute abnormality seen at the right upper quadrant. 2. 0.3 cm polyp incidentally noted along the gallbladder wall. Gallbladder otherwise unremarkable. Electronically Signed   By: Roanna Raider M.D.   On: 08/13/2015 22:14     CBC  Recent Labs Lab 08/13/15 0943 08/14/15 0408 08/15/15 0521 08/16/15 0425 08/18/15 0557  WBC 11.3* 15.1* 17.5* 13.2* 12.1*  HGB 15.8 15.1 15.0 13.8 12.0*  HCT 44.8 45.2 44.7 41.7 36.5*  PLT 205 205 191 195 195  MCV 83.9 86.6 87.5 87.8 88.0  MCH 29.6 28.9 29.4 29.1 28.9  MCHC 35.3 33.4 33.6 33.1 32.9  RDW 12.1 12.2 12.3 12.4 12.5  LYMPHSABS 1.0  --   --  0.7 0.7  MONOABS 0.9  --   --  0.7 1.4*  EOSABS 0.0  --   --  0.0 0.1  BASOSABS 0.0  --   --  0.0 0.0    Chemistries   Recent Labs Lab 08/14/15 0408 08/15/15 0521 08/16/15 0425 08/17/15 0708 08/18/15 0557  NA 135 135 136 137 137  K 3.9 3.8 3.8 3.3* 3.7  CL 99* 99* 100* 101 100*  CO2 32  GLUCOSE 104* 106* 107* 95 107*  BUN 7 8 8  <5* 5*  CREATININE 0.67 0.76 0.79 0.70 0.69  CALCIUM 8.9 8.9 8.7* 8.5* 8.7*  AST 17 22 23 22 20   ALT 15* 12* 12* 14* 15*  ALKPHOS 43 46 49 50 54  BILITOT 1.2 1.7* 1.5* 1.3* 1.2   ------------------------------------------------------------------------------------------------------------------ estimated creatinine clearance is 153.1 mL/min (by C-G formula based on Cr of 0.69). ------------------------------------------------------------------------------------------------------------------ No results for input(s): HGBA1C in the last 72 hours. ------------------------------------------------------------------------------------------------------------------ No results for  input(s): CHOL, HDL, LDLCALC, TRIG, CHOLHDL, LDLDIRECT in the last 72 hours. ------------------------------------------------------------------------------------------------------------------ No results for input(s): TSH, T4TOTAL, T3FREE, THYROIDAB in the last 72 hours.  Invalid input(s): FREET3 ------------------------------------------------------------------------------------------------------------------ No results for input(s): VITAMINB12, FOLATE, FERRITIN, TIBC, IRON, RETICCTPCT in the last 72 hours.  Coagulation profile No results for input(s): INR, PROTIME in the last 168 hours.  No results for input(s): DDIMER in the last 72 hours.  Cardiac Enzymes No results for input(s): CKMB, TROPONINI, MYOGLOBIN in the last 168 hours.  Invalid input(s): CK ------------------------------------------------------------------------------------------------------------------ Invalid input(s): POCBNP   CBG: No results for input(s): GLUCAP in the last 168 hours.     Studies: Ct Abdomen Pelvis W Contrast  08/17/2015  CLINICAL DATA:  Acute onset of fever and generalized abdominal pain. Recently diagnosed pancreatitis. Initial encounter. EXAM: CT ABDOMEN AND PELVIS WITH CONTRAST TECHNIQUE: Multidetector CT imaging of the abdomen and pelvis was performed using the standard protocol following bolus administration of intravenous contrast. CONTRAST:  ISOVUE-300 IOPAMIDOL (ISOVUE-300) INJECTION 61% COMPARISON:  CT of the abdomen and pelvis, and right upper quadrant ultrasound, performed 08/13/2015 FINDINGS: Trace bilateral pleural fluid is noted, with bibasilar atelectasis. There has been interval worsening in the appearance of the patient's pancreatitis, with diffusely increased inflammation tracking about the majority of the pancreas, and new marked phlegmon tracking inferiorly along the small bowel mesentery to the level of the mid abdomen. No definite pseudocyst formation is seen at this time.  There is no obvious devascularization of the pancreas. The liver and spleen are unremarkable in appearance. The gallbladder is within normal limits. The adrenal glands are unremarkable in appearance. The kidneys are unremarkable in appearance. There is no evidence of hydronephrosis. No renal or ureteral stones are seen. Minimal nonspecific perinephric stranding is noted bilaterally. No free fluid is identified. The small bowel is unremarkable in appearance. The stomach is within normal limits. No acute vascular abnormalities are seen. The appendix is normal in caliber and contains air, without evidence of appendicitis. The colon is grossly unremarkable in appearance. The bladder is mildly distended and grossly unremarkable. The prostate is borderline normal in size. No inguinal lymphadenopathy is seen. No acute osseous abnormalities are identified. IMPRESSION: 1. Interval worsening in appearance of pancreatitis, with diffusely increased inflammation tracking about the majority of the pancreas, and new marked phlegmon tracking inferiorly along the small bowel mesentery to the level of the mid abdomen. No definite devascularization or pseudocyst formation seen at this time. 2. Trace bilateral pleural fluid, with bibasilar atelectasis. Electronically Signed   By: Roanna Raider M.D.   On: 08/17/2015 04:57      Lab Results  Component Value Date   HGBA1C 5.0 08/13/2015   HGBA1C 5.2 09/06/2014   HGBA1C 5.5 02/24/2014   Lab Results  Component Value Date   MICROALBUR 0.8 09/06/2014   LDLCALC 76 08/14/2015   CREATININE 0.69 08/18/2015       Scheduled Meds: . diatrizoate meglumine-sodium  30 mL Oral Once  . enoxaparin (LOVENOX) injection  40 mg Subcutaneous Q24H  . folic acid  1 mg Oral Daily  . multivitamin with minerals  1 tablet Oral Daily  . piperacillin-tazobactam (ZOSYN)  IV  3.375 g Intravenous Q8H  . thiamine  100 mg Oral Daily   Continuous Infusions: . 0.9 % NaCl with KCl 40 mEq / L 100  mL/hr (08/18/15 0905)     LOS: 4 days    Time spent: >30 MINS    [facility]  [facility] Pager [phone]. If 7PM-7AM, please contact night-coverage at [email or web address], password [facility] 08/18/2015, 11:24 AM  LOS: 4 days

## 2015-08-18 NOTE — Progress Notes (Signed)
GI - Please call Wife Ignacia Marvelristia at [phone], she has lots of concerns of questions. Thanks, [employer] RN [phone]

## 2015-08-18 NOTE — Progress Notes (Signed)
Patient felt well enough to shower. His pain is getting better, day by day, although it is still present to some degree. He is tired of the liquid diet and would like to be advanced to solid food, if possible.  On exam, he is in no distress. His color is good. He is up and walking around the room. Bowel sounds are present. The abdomen is nondistended. There is mild epigastric fullness and tenderness, not really anything impressive. No jaundice. No fever. Vitals unremarkable.  In terms of labs, he has persistent mild leukocytosis, white count 12,500 which is actually slightly higher than yesterday. Renal function remains normal, liver chemistries normal, bilirubin normal. Lipase was normal when last checked 2 days ago.  Impression: Resolving pancreatitis, etiology unclear, wonder about prehospitalization HCTZ, versus OTC Nexium, versus alcohol. Virtually no probability of gallstone pancreatitis given normal liver chemistries at time of exam and absence of gallstones on ultrasound.  Plan:  1. Trial of low-fat diet with pancreatic enzyme supplementation (I encouraged the patient eat very small amounts initially, to see if he tolerates this advancement).  2. MiraLAX to help prevent constipation; patient normally uses Metamucil at home to help prevent hemorrhoidal irritation, and since he has been lying in bed, on narcotics, I'm concerned he may get constipated and cause a flare up of his hemorrhoids.  3. Approximately 40 minutes was spent answering multiple questions, including significance of gallbladder polyp seen on ultrasound (I explained that such a lesion probably very slightly increases the patient's risk of getting gallbladder cancer, but it's a remote possibility that does not necessitate prophylactic cholecystectomy). That questions about his fluid collection and "debris" CT report. Discussed a considerable length possible etiologies for his pancreatitis, prognosis, natural history. They want to  know what could be done to help prevent this in the future, and I recommended complete alcohol avoidance (while acknowledging that the contribution of alcohol to his condition is not known), as well as avoidance of HCTZ. At this time, I think it is very unlikely that Nexium was the culprit so I see no need to avoid that going forward.. Questions about his sigmoid diverticulosis (recommended avoidance constipation) were also addressed. Finally, we discussed the role of follow-up CT scanning. I explained that he might not need one to monitor his progress while in the hospital, as long as he continues to improve clinically, but he might need one eventually to confirm resolution of his fluid collections and the absence of a pseudocyst.  Florencia Reasonsobert V. Markhi Kleckner, M.D. Pager [phone] If no answer or after 5 PM call [phone]

## 2015-08-19 LAB — COMPREHENSIVE METABOLIC PANEL
ALBUMIN: 2.5 g/dL — AB (ref 3.5–5.0)
ALT: 31 U/L (ref 17–63)
AST: 46 U/L — AB (ref 15–41)
Alkaline Phosphatase: 61 U/L (ref 38–126)
Anion gap: 7 (ref 5–15)
BUN: 7 mg/dL (ref 6–20)
CHLORIDE: 101 mmol/L (ref 101–111)
CO2: 30 mmol/L (ref 22–32)
CREATININE: 0.74 mg/dL (ref 0.61–1.24)
Calcium: 8.9 mg/dL (ref 8.9–10.3)
GFR calc Af Amer: >60 mL/min (ref >60)
GFR calc non Af Amer: >60 mL/min (ref >60)
Glucose, Bld: 93 mg/dL (ref 65–99)
POTASSIUM: 3.8 mmol/L (ref 3.5–5.1)
SODIUM: 138 mmol/L (ref 135–145)
Total Bilirubin: 0.9 mg/dL (ref 0.3–1.2)
Total Protein: 6 g/dL — ABNORMAL LOW (ref 6.5–8.1)

## 2015-08-19 MED ORDER — SODIUM CHLORIDE 0.9 % IV SOLN
INTRAVENOUS | Status: DC
  Administered 2015-08-19 – 2015-08-20 (×2): via INTRAVENOUS

## 2015-08-19 NOTE — Progress Notes (Signed)
GASTROENTEROLOGY PROGRESS NOTE  Problem:   Acute pancreatitis  Subjective: Still some abd pain, adequately controlled by Vicodin q 6 hrs.  Has been eating low fat diet today, and has tolerated it without any flare of pain or nausea.  Objective: Chemistry panel stable.  Lipase not rechecked. Pt in NAD; upper abd is somewhat firm, but essentially nontender.  Assessment: Resolving pancreatitis, see yesterday's note re. DDx  Plan: Continue observation on low fat diet, check lipase in a.m., consider dischg in next day or two.    Questionable whether pt needs outpt pancr enz suppl;   Also, ok to d/c antbx from our standpoint.    Pt and wife were instructed by me in low fat diet; will need guidance about return-to-work, activity, etc.      Would arrange outpt f/u w/ Dr. Dorena CookeyJohn Nguyen for GI  We will follow with you.  Shane Nguyen, M.D. 08/19/2015 4:12 PM  Pager [phone] If no answer or after 5 PM call [phone]

## 2015-08-19 NOTE — Progress Notes (Signed)
[facility] PROGRESS NOTE  Shane Nguyen ZOX:[id number] DOB: April 03, 1980 DOA: 08/13/2015   PCP: Nadean Corwin, MD     Assessment/Plan: Active Problems:   Acute pancreatitis   Alcoholic pancreatitis   35 yo gentleman with a history of HTN, GERD, and vitamin D deficiency who presents with acute pancreatitis. Etiology unclear at this point. He denies binge drinking; says he only consumed two alcoholic beverages at at a cook out yesterday. He reports that he only drinks 1-2 times per week. He denies a history of EtOH dependence or withdrawal. He is concerned about gallbladder disease because his mother has a history of cholelithiasis.CT A/p Acute pancreatitis without evidence of complication by CT. Minimal sigmoid diverticulosis. No gallstones or evidence of biliary obstruction   Assessment and plan  Acute alcoholic  Pancreatitis: evolving phlegmon/fluid collections Patient continues to improve clinically every day, started on a low-fat diet yesterday Lipase 980 on admission, last checked was 34,  Bilirubin trending down White count has increased from 11.3-17.5, concern for pancreatic necrosis, white count improved after starting on Zosyn on 7/5, white blood cell count is 12.5 today GI recommends to discontinue Zosyn if continued improvement within the next 1-2 days, hopefully tomorrow if white count continues to improve Initial CT abdomen showing peripancreatic edema without complication at this point No history of previous pancreatic flare. Patient is social ETOH drinker.  Lipid panel does not show hypertriglyceridemia, recheck lipase Hold  HCTZ as a possible culprit, and possibly discontinue, right upper quadrant ultrasound shows a small polyp no gallstones , GI explained to the family that this has a very low risk of developing into gallbladder cancer GI also recommended stopping HCTZ, alcohol Dr. Matthias Hughs to make further recommendations -likely will not need repeat  CT in the hospital but may need 1 in the outpatient setting to ensure complete resolution of fluid collections/confirm absence of pseudocyst   Leukocytosis, likely reactive and recent steroidswhite count has increased from 11.3-17.5, possibly due to phlegmon, now improving     Hypertension BP   Controlled, discontinue HCTZ   Sigmoid diverticulosis-avoid constipation   Hyperlipidemia Not on statin  Triglycerides 43, LDL 76, low-fat diet  Hypokalemia, may be due to diuretics. Repleted, KCL IV  Repeat CMET in am   Hyperglycemia Current blood sugar level is 132 Hemoglobin A1c 5.0   Anxiety Continue Xanax         DVT prophylaxsis   Lovenox  Code Status:  Full code     Family Communication: Discussed in detail with the patient And his entire extended family, all imaging results, lab results explained to the patient   Disposition Plan:   Anticipate discharge In a.m. if white count has improved     Consultants:  [facility]  Procedures:   None  Antibiotics: Anti-infectives    Start     Dose/Rate Route Frequency Ordered Stop   08/15/15 1800  piperacillin-tazobactam (ZOSYN) IVPB 3.375 g     3.375 g 12.5 mL/hr over 240 Minutes Intravenous Every 8 hours 08/15/15 1640           HPI/Subjective: Still having pain , but tolerating low fat diet  Objective: Filed Vitals:   08/18/15 0519 08/18/15 1300 08/18/15 2139 08/19/15 0432  BP: 130/70 138/73 136/74 123/69  Pulse: 76 89 69 72  Temp: 98.4 F (36.9 C) 99.1 F (37.3 C) 97.8 F (36.6 C) 98.2 F (36.8 C)  TempSrc: Oral Oral Oral Oral  Resp:   17 18  Height:  Weight:    93.8 kg (206 lb 12.7 oz)  SpO2: 96% 97% 97% 100%    Intake/Output Summary (Last 24 hours) at 08/19/15 1116 Last data filed at 08/19/15 0741  Gross per 24 hour  Intake 3568.34 ml  Output   1900 ml  Net 1668.34 ml    Exam:  Examination:  General exam: Appears calm and comfortable  Respiratory system: Clear to  auscultation. Respiratory effort normal. Cardiovascular system: S1 & S2 heard, RRR. No JVD, murmurs, rubs, gallops or clicks. No pedal edema. Gastrointestinal system: Abdomen is nondistended, soft and nontender. No organomegaly or masses felt. Normal bowel sounds heard. Central nervous system: Alert and oriented. No focal neurological deficits. Extremities: Symmetric 5 x 5 power. Skin: No rashes, lesions or ulcers Psychiatry: Judgement and insight appear normal. Mood & affect appropriate.     Data Reviewed: I have personally reviewed following labs and imaging studies  Micro Results Recent Results (from the past 240 hour(s))  Culture, blood (Routine X 2) w Reflex to ID Panel     Status: None (Preliminary result)   Collection Time: 08/15/15  4:56 PM  Result Value Ref Range Status   Specimen Description BLOOD LEFT FOREARM  Final   Special Requests BOTTLES DRAWN AEROBIC AND ANAEROBIC 5CC  Final   Culture NO GROWTH 3 DAYS  Final   Report Status PENDING  Incomplete  Culture, blood (Routine X 2) w Reflex to ID Panel     Status: None (Preliminary result)   Collection Time: 08/15/15  6:25 PM  Result Value Ref Range Status   Specimen Description BLOOD LEFT ANTECUBITAL  Final   Special Requests BOTTLES DRAWN AEROBIC AND ANAEROBIC 5CC   Final   Culture NO GROWTH 3 DAYS  Final   Report Status PENDING  Incomplete    Radiology Reports Ct Abdomen Pelvis W Contrast  08/17/2015  CLINICAL DATA:  Acute onset of fever and generalized abdominal pain. Recently diagnosed pancreatitis. Initial encounter. EXAM: CT ABDOMEN AND PELVIS WITH CONTRAST TECHNIQUE: Multidetector CT imaging of the abdomen and pelvis was performed using the standard protocol following bolus administration of intravenous contrast. CONTRAST:  ISOVUE-300 IOPAMIDOL (ISOVUE-300) INJECTION 61% COMPARISON:  CT of the abdomen and pelvis, and right upper quadrant ultrasound, performed 08/13/2015 FINDINGS: Trace bilateral pleural fluid is  noted, with bibasilar atelectasis. There has been interval worsening in the appearance of the patient's pancreatitis, with diffusely increased inflammation tracking about the majority of the pancreas, and new marked phlegmon tracking inferiorly along the small bowel mesentery to the level of the mid abdomen. No definite pseudocyst formation is seen at this time. There is no obvious devascularization of the pancreas. The liver and spleen are unremarkable in appearance. The gallbladder is within normal limits. The adrenal glands are unremarkable in appearance. The kidneys are unremarkable in appearance. There is no evidence of hydronephrosis. No renal or ureteral stones are seen. Minimal nonspecific perinephric stranding is noted bilaterally. No free fluid is identified. The small bowel is unremarkable in appearance. The stomach is within normal limits. No acute vascular abnormalities are seen. The appendix is normal in caliber and contains air, without evidence of appendicitis. The colon is grossly unremarkable in appearance. The bladder is mildly distended and grossly unremarkable. The prostate is borderline normal in size. No inguinal lymphadenopathy is seen. No acute osseous abnormalities are identified. IMPRESSION: 1. Interval worsening in appearance of pancreatitis, with diffusely increased inflammation tracking about the majority of the pancreas, and new marked phlegmon tracking inferiorly along the  small bowel mesentery to the level of the mid abdomen. No definite devascularization or pseudocyst formation seen at this time. 2. Trace bilateral pleural fluid, with bibasilar atelectasis. Electronically Signed   By: Jeffery  ChangRoanna Raider 08/17/2015 04:57   Ct Abdomen Pelvis W Contrast  08/13/2015  CLINICAL DATA:  Awoke with abdominal pain today, states he ate too much last night, intermittent pain, feels need to burp, history hypertension EXAM: CT ABDOMEN AND PELVIS WITH CONTRAST TECHNIQUE: Multidetector CT  imaging of the abdomen and pelvis was performed using the standard protocol following bolus administration of intravenous contrast. Sagittal and coronal MPR images reconstructed from axial data set. CONTRAST:  ISOVUE-300 IOPAMIDOL (ISOVUE-300) INJECTION 61% IV. Dilute oral contrast. COMPARISON:  None FINDINGS: Lower chest:  Lung bases clear Hepatobiliary: Liver and gallbladder normal appearance Pancreas: Peripancreatic edema extending into small bowel mesenteric and adjacent to hepatic flexure of colon compatible with acute pancreatitis. No definite pancreatic mass, calcification, ductal dilatation or necrosis. No discrete peripancreatic fluid collections. Spleen: Normal appearance Adrenals/Urinary Tract: Kidneys, ureters, bladder, and prostate gland normal appearance. Stomach/Bowel: Minimal sigmoid diverticulosis without evidence of diverticulitis. Stomach and bowel loops otherwise normal appearance. Vascular/Lymphatic: Aorta normal caliber. Major intra-abdominal vascular structures are patent, including portal vein, SMV and splenic vein. Scattered normal size retroperitoneal nodes without adenopathy. Reproductive: N/A Other: Small amount of free fluid dependently in pelvis. No free intraperitoneal air. Musculoskeletal: Osseous structures unremarkable. IMPRESSION: Acute pancreatitis without evidence of complication by CT. Minimal sigmoid diverticulosis. Electronically Signed   By: Ulyses Southward M.D.   On: 08/13/2015 10:31   Dg Chest Port 1 View  08/15/2015  CLINICAL DATA:  Fever 1 day EXAM: PORTABLE CHEST 1 VIEW COMPARISON:  None FINDINGS: Exam is lordotic. Normal mediastinum and cardiac silhouette. Normal pulmonary vasculature. No evidence of effusion, infiltrate, or pneumothorax. No acute bony abnormality. IMPRESSION: No acute cardiopulmonary process. Electronically Signed   By: Genevive Bi M.D.   On: 08/15/2015 17:41   US Abdomen Limited Ruq  08/13/2015  CLINICAL DATA:  Acute onset of generalized  abdominal pain. Elevated lipase. Concern for pancreatitis. Initial encounter. EXAM: US ABDOMEN LIMITED - RIGHT UPPER QUADRANT COMPARISON:  CT of the abdomen pelvis performed earlier today at 10:21 a.m. FINDINGS: Gallbladder: No gallstones or wall thickening visualized. A 0.3 cm polyp is noted along the gallbladder wall. No sonographic Murphy sign noted by sonographer. Common bile duct: Diameter: 0.3 cm, within normal limits in caliber. Liver: No focal lesion identified. Within normal limits in parenchymal echogenicity. IMPRESSION: 1. No acute abnormality seen at the right upper quadrant. 2. 0.3 cm polyp incidentally noted along the gallbladder wall. Gallbladder otherwise unremarkable. Electronically Signed   By: Roanna Raider M.D.   On: 08/13/2015 22:14     CBC  Recent Labs Lab 08/13/15 0943 08/14/15 0408 08/15/15 0521 08/16/15 0425 08/18/15 0557 08/18/15 1150  WBC 11.3* 15.1* 17.5* 13.2* 12.1* 12.5*  HGB 15.8 15.1 15.0 13.8 12.0* 12.2*  HCT 44.8 45.2 44.7 41.7 36.5* 36.5*  PLT 205 205 191 195 195 203  MCV 83.9 86.6 87.5 87.8 88.0 87.5  MCH 29.6 28.9 29.4 29.1 28.9 29.3  MCHC 35.3 33.4 33.6 33.1 32.9 33.4  RDW 12.1 12.2 12.3 12.4 12.5 12.5  LYMPHSABS 1.0  --   --  0.7 0.7 1.2  MONOABS 0.9  --   --  0.7 1.4* 0.8  EOSABS 0.0  --   --  0.0 0.1 0.1  BASOSABS 0.0  --   --  0.0 0.0 0.0    Chemistries   Recent Labs Lab 08/15/15 0521 08/16/15 0425 08/17/15 0708 08/18/15 0557 08/19/15 0451  NA 135 136 137 137 138  K 3.8 3.8 3.3* 3.7 3.8  CL 99* 100* 101 100* 101  CO2 28 31 31  32 30  GLUCOSE 106* 107* 95 107* 93  BUN 8 8 <5* 5* 7  CREATININE 0.76 0.79 0.70 0.69 0.74  CALCIUM 8.9 8.7* 8.5* 8.7* 8.9  AST 22 23 22 20  46*  ALT 12* 12* 14* 15* 31  ALKPHOS 46 49 50 54 61  BILITOT 1.7* 1.5* 1.3* 1.2 0.9   ------------------------------------------------------------------------------------------------------------------ estimated creatinine clearance is 151.8 mL/min (by C-G formula  based on Cr of 0.74). ------------------------------------------------------------------------------------------------------------------ No results for input(s): HGBA1C in the last 72 hours. ------------------------------------------------------------------------------------------------------------------ No results for input(s): CHOL, HDL, LDLCALC, TRIG, CHOLHDL, LDLDIRECT in the last 72 hours. ------------------------------------------------------------------------------------------------------------------ No results for input(s): TSH, T4TOTAL, T3FREE, THYROIDAB in the last 72 hours.  Invalid input(s): FREET3 ------------------------------------------------------------------------------------------------------------------ No results for input(s): VITAMINB12, FOLATE, FERRITIN, TIBC, IRON, RETICCTPCT in the last 72 hours.  Coagulation profile No results for input(s): INR, PROTIME in the last 168 hours.  No results for input(s): DDIMER in the last 72 hours.  Cardiac Enzymes No results for input(s): CKMB, TROPONINI, MYOGLOBIN in the last 168 hours.  Invalid input(s): CK ------------------------------------------------------------------------------------------------------------------ Invalid input(s): POCBNP   CBG: No results for input(s): GLUCAP in the last 168 hours.     Studies: No results found.    Lab Results  Component Value Date   HGBA1C 5.0 08/13/2015   HGBA1C 5.2 09/06/2014   HGBA1C 5.5 02/24/2014   Lab Results  Component Value Date   MICROALBUR 0.8 09/06/2014   LDLCALC 76 08/14/2015   CREATININE 0.74 08/19/2015       Scheduled Meds: . diatrizoate meglumine-sodium  30 mL Oral Once  . enoxaparin (LOVENOX) injection  40 mg Subcutaneous Q24H  . folic acid  1 mg Oral Daily  . lipase/protease/amylase  36,000 Units Oral TID WC  . multivitamin with minerals  1 tablet Oral Daily  . piperacillin-tazobactam (ZOSYN)  IV  3.375 g Intravenous Q8H  . polyethylene  glycol  17 g Oral BID  . thiamine  100 mg Oral Daily   Continuous Infusions: . sodium chloride       LOS: 5 days    Time spent: >30 MINS    [facility] Lucie Medical CenterBROL,Anis Degidio  [facility] Pager [phone]. If 7PM-7AM, please contact night-coverage at [email or web address], password [email or web address] 08/19/2015, 11:16 AM  LOS: 5 days

## 2015-08-20 LAB — COMPREHENSIVE METABOLIC PANEL
ALK PHOS: 74 U/L (ref 38–126)
ALT: 51 U/L (ref 17–63)
ANION GAP: 6 (ref 5–15)
AST: 64 U/L — ABNORMAL HIGH (ref 15–41)
Albumin: 2.8 g/dL — ABNORMAL LOW (ref 3.5–5.0)
BUN: 8 mg/dL (ref 6–20)
CALCIUM: 9 mg/dL (ref 8.9–10.3)
CO2: 32 mmol/L (ref 22–32)
Chloride: 98 mmol/L — ABNORMAL LOW (ref 101–111)
Creatinine, Ser: 0.78 mg/dL (ref 0.61–1.24)
GFR calc non Af Amer: >60 mL/min (ref >60)
Glucose, Bld: 93 mg/dL (ref 65–99)
POTASSIUM: 4 mmol/L (ref 3.5–5.1)
SODIUM: 136 mmol/L (ref 135–145)
Total Bilirubin: 1 mg/dL (ref 0.3–1.2)
Total Protein: 6.6 g/dL (ref 6.5–8.1)

## 2015-08-20 LAB — CULTURE, BLOOD (ROUTINE X 2)
CULTURE: NO GROWTH
CULTURE: NO GROWTH

## 2015-08-20 LAB — CBC
HCT: 41 % (ref 39.0–52.0)
HEMOGLOBIN: 13.5 g/dL (ref 13.0–17.0)
MCH: 29.2 pg (ref 26.0–34.0)
MCHC: 32.9 g/dL (ref 30.0–36.0)
MCV: 88.7 fL (ref 78.0–100.0)
PLATELETS: 226 10*3/uL (ref 150–400)
RBC: 4.62 MIL/uL (ref 4.22–5.81)
RDW: 12.6 % (ref 11.5–15.5)
WBC: 7.5 10*3/uL (ref 4.0–10.5)

## 2015-08-20 LAB — LIPASE, BLOOD
LIPASE: 32 U/L (ref 11–51)
Lipase: 25 U/L (ref 11–51)

## 2015-08-20 MED ORDER — BISOPROLOL FUMARATE 5 MG PO TABS: 5.0000 mg | ORAL_TABLET | Freq: Every day | ORAL | Status: DC

## 2015-08-20 MED ORDER — FOLIC ACID 1 MG PO TABS: 1.0000 mg | ORAL_TABLET | Freq: Every day | ORAL | Status: DC

## 2015-08-20 MED ORDER — THIAMINE HCL 100 MG PO TABS: 100.0000 mg | ORAL_TABLET | Freq: Every day | ORAL | Status: DC

## 2015-08-20 MED ORDER — HYDROCODONE-ACETAMINOPHEN 5-325 MG PO TABS: 1.0000 | ORAL_TABLET | Freq: Four times a day (QID) | ORAL | Status: DC | PRN

## 2015-08-20 MED ORDER — PANCRELIPASE (LIP-PROT-AMYL) 36000-114000 UNITS PO CPEP: 36000.0000 [IU] | ORAL_CAPSULE | Freq: Three times a day (TID) | ORAL | Status: DC

## 2015-08-20 NOTE — Discharge Summary (Addendum)
Physician Discharge Summary  Ash Mcelwain MRN: [id number] DOB/AGE: June 05, 1980 35 y.o.  PCP: Nadean Corwin, MD   Admit date: 08/13/2015 Discharge date: 08/20/2015  Discharge Diagnoses:     Active Problems:   Acute pancreatitis   Alcoholic pancreatitis    Follow-up recommendations Follow-up with PCP in 3-5 days , including all  additional recommended appointments as below Follow-up CBC, CMP in 3-5 days Patient advised to continue a low-fat diet outpt f/u w/ Dr. Dorena Cookey for GI-recommended by Bernette Redbird, MD       Current Discharge Medication List    START taking these medications   Details  bisoprolol (ZEBETA) 5 MG tablet Take 1 tablet (5 mg total) by mouth daily. Qty: 30 tablet, Refills: 1    folic acid (FOLVITE) 1 MG tablet Take 1 tablet (1 mg total) by mouth daily. Qty: 30 tablet, Refills: 0    lipase/protease/amylase (CREON) 36000 UNITS CPEP capsule Take 1 capsule (36,000 Units total) by mouth 3 (three) times daily with meals. Qty: 270 capsule, Refills: 2    thiamine 100 MG tablet Take 1 tablet (100 mg total) by mouth daily. Qty: 30 tablet, Refills: 0      CONTINUE these medications which have NOT CHANGED   Details  ALPRAZolam (XANAX) 1 MG tablet TAKE ONE TABLET BY MOUTH THREE TIMES DAILY AS NEEDED FOR ANXIETY Qty: 90 tablet, Refills: 0    Melatonin 10 MG TABS Take 1 tablet by mouth at bedtime.    multivitamin (ONE-A-DAY MEN'S) TABS tablet Take 1 tablet by mouth daily.    psyllium (REGULOID) 0.52 g capsule Take 3.12 g by mouth daily.    ranitidine (ZANTAC) 150 MG capsule Take 150 mg by mouth 2 (two) times daily. Reported on 08/13/2015    Flaxseed, Linseed, (FLAXSEED OIL) 1000 MG CAPS Take by mouth. Reported on 08/13/2015      STOP taking these medications     bisoprolol-hydrochlorothiazide (ZIAC) 5-6.25 MG tablet          Discharge Condition: *Stable   Discharge Instructions Get Medicines reviewed and adjusted: Please take all your  medications with you for your next visit with your Primary MD  Please request your Primary MD to go over all hospital tests and procedure/radiological results at the follow up, please ask your Primary MD to get all Hospital records sent to his/her office.  If you experience worsening of your admission symptoms, develop shortness of breath, life threatening emergency, suicidal or homicidal thoughts you must seek medical attention immediately by calling 911 or calling your MD immediately if symptoms less severe.  You must read complete instructions/literature along with all the possible adverse reactions/side effects for all the Medicines you take and that have been prescribed to you. Take any new Medicines after you have completely understood and accpet all the possible adverse reactions/side effects.   Do not drive when taking Pain medications.   Do not take more than prescribed Pain, Sleep and Anxiety Medications  Special Instructions: If you have smoked or chewed Tobacco in the last 2 yrs please stop smoking, stop any regular Alcohol and or any Recreational drug use.  Wear Seat belts while driving.  Please note  You were cared for by a hospitalist during your hospital stay. Once you are discharged, your primary care physician will handle any further medical issues. Please note that NO REFILLS for any discharge medications will be authorized once you are discharged, as it is imperative that you return to your primary care physician (or establish  a relationship with a primary care physician if you do not have one) for your aftercare needs so that they can reassess your need for medications and monitor your lab values.     No Known Allergies    Disposition: Home with wife   Consults: * Gastroenterology     Significant Diagnostic Studies:  Ct Abdomen Pelvis W Contrast  08/17/2015  CLINICAL DATA:  Acute onset of fever and generalized abdominal pain. Recently diagnosed  pancreatitis. Initial encounter. EXAM: CT ABDOMEN AND PELVIS WITH CONTRAST TECHNIQUE: Multidetector CT imaging of the abdomen and pelvis was performed using the standard protocol following bolus administration of intravenous contrast. CONTRAST:  ISOVUE-300 IOPAMIDOL (ISOVUE-300) INJECTION 61% COMPARISON:  CT of the abdomen and pelvis, and right upper quadrant ultrasound, performed 08/13/2015 FINDINGS: Trace bilateral pleural fluid is noted, with bibasilar atelectasis. There has been interval worsening in the appearance of the patient's pancreatitis, with diffusely increased inflammation tracking about the majority of the pancreas, and new marked phlegmon tracking inferiorly along the small bowel mesentery to the level of the mid abdomen. No definite pseudocyst formation is seen at this time. There is no obvious devascularization of the pancreas. The liver and spleen are unremarkable in appearance. The gallbladder is within normal limits. The adrenal glands are unremarkable in appearance. The kidneys are unremarkable in appearance. There is no evidence of hydronephrosis. No renal or ureteral stones are seen. Minimal nonspecific perinephric stranding is noted bilaterally. No free fluid is identified. The small bowel is unremarkable in appearance. The stomach is within normal limits. No acute vascular abnormalities are seen. The appendix is normal in caliber and contains air, without evidence of appendicitis. The colon is grossly unremarkable in appearance. The bladder is mildly distended and grossly unremarkable. The prostate is borderline normal in size. No inguinal lymphadenopathy is seen. No acute osseous abnormalities are identified. IMPRESSION: 1. Interval worsening in appearance of pancreatitis, with diffusely increased inflammation tracking about the majority of the pancreas, and new marked phlegmon tracking inferiorly along the small bowel mesentery to the level of the mid abdomen. No definite  devascularization or pseudocyst formation seen at this time. 2. Trace bilateral pleural fluid, with bibasilar atelectasis. Electronically Signed   By: Roanna Raider M.D.   On: 08/17/2015 04:57   Ct Abdomen Pelvis W Contrast  08/13/2015  CLINICAL DATA:  Awoke with abdominal pain today, states he ate too much last night, intermittent pain, feels need to burp, history hypertension EXAM: CT ABDOMEN AND PELVIS WITH CONTRAST TECHNIQUE: Multidetector CT imaging of the abdomen and pelvis was performed using the standard protocol following bolus administration of intravenous contrast. Sagittal and coronal MPR images reconstructed from axial data set. CONTRAST:  ISOVUE-300 IOPAMIDOL (ISOVUE-300) INJECTION 61% IV. Dilute oral contrast. COMPARISON:  None FINDINGS: Lower chest:  Lung bases clear Hepatobiliary: Liver and gallbladder normal appearance Pancreas: Peripancreatic edema extending into small bowel mesenteric and adjacent to hepatic flexure of colon compatible with acute pancreatitis. No definite pancreatic mass, calcification, ductal dilatation or necrosis. No discrete peripancreatic fluid collections. Spleen: Normal appearance Adrenals/Urinary Tract: Kidneys, ureters, bladder, and prostate gland normal appearance. Stomach/Bowel: Minimal sigmoid diverticulosis without evidence of diverticulitis. Stomach and bowel loops otherwise normal appearance. Vascular/Lymphatic: Aorta normal caliber. Major intra-abdominal vascular structures are patent, including portal vein, SMV and splenic vein. Scattered normal size retroperitoneal nodes without adenopathy. Reproductive: N/A Other: Small amount of free fluid dependently in pelvis. No free intraperitoneal air. Musculoskeletal: Osseous structures unremarkable. IMPRESSION: Acute pancreatitis without evidence of complication by  CT. Minimal sigmoid diverticulosis. Electronically Signed   By: Lavonia Dana M.D.   On: 08/13/2015 10:31   Dg Chest Port 1 View  08/15/2015   CLINICAL DATA:  Fever 1 day EXAM: PORTABLE CHEST 1 VIEW COMPARISON:  None FINDINGS: Exam is lordotic. Normal mediastinum and cardiac silhouette. Normal pulmonary vasculature. No evidence of effusion, infiltrate, or pneumothorax. No acute bony abnormality. IMPRESSION: No acute cardiopulmonary process. Electronically Signed   By: Suzy Bouchard M.D.   On: 08/15/2015 17:41   US Abdomen Limited Ruq  08/13/2015  CLINICAL DATA:  Acute onset of generalized abdominal pain. Elevated lipase. Concern for pancreatitis. Initial encounter. EXAM: US ABDOMEN LIMITED - RIGHT UPPER QUADRANT COMPARISON:  CT of the abdomen pelvis performed earlier today at 10:21 a.m. FINDINGS: Gallbladder: No gallstones or wall thickening visualized. A 0.3 cm polyp is noted along the gallbladder wall. No sonographic Murphy sign noted by sonographer. Common bile duct: Diameter: 0.3 cm, within normal limits in caliber. Liver: No focal lesion identified. Within normal limits in parenchymal echogenicity. IMPRESSION: 1. No acute abnormality seen at the right upper quadrant. 2. 0.3 cm polyp incidentally noted along the gallbladder wall. Gallbladder otherwise unremarkable. Electronically Signed   By: Garald Balding M.D.   On: 08/13/2015 22:14       Filed Weights   08/17/15 0500 08/19/15 0432 08/20/15 0503  Weight: 95.7 kg (210 lb 15.7 oz) 93.8 kg (206 lb 12.7 oz) 92.4 kg (203 lb 11.3 oz)     Microbiology: Recent Results (from the past 240 hour(s))  Culture, blood (Routine X 2) w Reflex to ID Panel     Status: None (Preliminary result)   Collection Time: 08/15/15  4:56 PM  Result Value Ref Range Status   Specimen Description BLOOD LEFT FOREARM  Final   Special Requests BOTTLES DRAWN AEROBIC AND ANAEROBIC 5CC  Final   Culture NO GROWTH 4 DAYS  Final   Report Status PENDING  Incomplete  Culture, blood (Routine X 2) w Reflex to ID Panel     Status: None (Preliminary result)   Collection Time: 08/15/15  6:25 PM  Result Value Ref Range  Status   Specimen Description BLOOD LEFT ANTECUBITAL  Final   Special Requests BOTTLES DRAWN AEROBIC AND ANAEROBIC 5CC   Final   Culture NO GROWTH 4 DAYS  Final   Report Status PENDING  Incomplete       Blood Culture    Component Value Date/Time   SDES BLOOD LEFT ANTECUBITAL 08/15/2015 1825   SPECREQUEST BOTTLES DRAWN AEROBIC AND ANAEROBIC 5CC  08/15/2015 1825   CULT NO GROWTH 4 DAYS 08/15/2015 1825   REPTSTATUS PENDING 08/15/2015 1825      Labs: Results for orders placed or performed during the hospital encounter of 08/13/15 (from the past 48 hour(s))  CBC with Differential/Platelet     Status: Abnormal   Collection Time: 08/18/15 11:50 AM  Result Value Ref Range   WBC 12.5 (H) 4.0 - 10.5 K/uL   RBC 4.17 (L) 4.22 - 5.81 MIL/uL   Hemoglobin 12.2 (L) 13.0 - 17.0 g/dL   HCT 36.5 (L) 39.0 - 52.0 %   MCV 87.5 78.0 - 100.0 fL   MCH 29.3 26.0 - 34.0 pg   MCHC 33.4 30.0 - 36.0 g/dL   RDW 12.5 11.5 - 15.5 %   Platelets 203 150 - 400 K/uL   Neutrophils Relative % 83 %   Neutro Abs 10.4 (H) 1.7 - 7.7 K/uL   Lymphocytes Relative 9 %  Lymphs Abs 1.2 0.7 - 4.0 K/uL   Monocytes Relative 7 %   Monocytes Absolute 0.8 0.1 - 1.0 K/uL   Eosinophils Relative 1 %   Eosinophils Absolute 0.1 0.0 - 0.7 K/uL   Basophils Relative 0 %   Basophils Absolute 0.0 0.0 - 0.1 K/uL  Comprehensive metabolic panel     Status: Abnormal   Collection Time: 08/19/15  4:51 AM  Result Value Ref Range   Sodium 138 135 - 145 mmol/L   Potassium 3.8 3.5 - 5.1 mmol/L   Chloride 101 101 - 111 mmol/L   CO2 30 22 - 32 mmol/L   Glucose, Bld 93 65 - 99 mg/dL   BUN 7 6 - 20 mg/dL   Creatinine, Ser 0.74 0.61 - 1.24 mg/dL   Calcium 8.9 8.9 - 10.3 mg/dL   Total Protein 6.0 (L) 6.5 - 8.1 g/dL   Albumin 2.5 (L) 3.5 - 5.0 g/dL   AST 46 (H) 15 - 41 U/L   ALT 31 17 - 63 U/L   Alkaline Phosphatase 61 38 - 126 U/L   Total Bilirubin 0.9 0.3 - 1.2 mg/dL   GFR calc non Af Amer >60 >60 mL/min   GFR calc Af Amer >60 >60  mL/min    Comment: (NOTE) The eGFR has been calculated using the CKD EPI equation. This calculation has not been validated in all clinical situations. eGFR's persistently <60 mL/min signify possible Chronic Kidney Disease.    Anion gap 7 5 - 15  CBC     Status: None   Collection Time: 08/20/15  5:16 AM  Result Value Ref Range   WBC 7.5 4.0 - 10.5 K/uL   RBC 4.62 4.22 - 5.81 MIL/uL   Hemoglobin 13.5 13.0 - 17.0 g/dL   HCT 41.0 39.0 - 52.0 %   MCV 88.7 78.0 - 100.0 fL   MCH 29.2 26.0 - 34.0 pg   MCHC 32.9 30.0 - 36.0 g/dL   RDW 12.6 11.5 - 15.5 %   Platelets 226 150 - 400 K/uL  Comprehensive metabolic panel     Status: Abnormal   Collection Time: 08/20/15  5:16 AM  Result Value Ref Range   Sodium 136 135 - 145 mmol/L   Potassium 4.0 3.5 - 5.1 mmol/L   Chloride 98 (L) 101 - 111 mmol/L   CO2 32 22 - 32 mmol/L   Glucose, Bld 93 65 - 99 mg/dL   BUN 8 6 - 20 mg/dL   Creatinine, Ser 0.78 0.61 - 1.24 mg/dL   Calcium 9.0 8.9 - 10.3 mg/dL   Total Protein 6.6 6.5 - 8.1 g/dL   Albumin 2.8 (L) 3.5 - 5.0 g/dL   AST 64 (H) 15 - 41 U/L   ALT 51 17 - 63 U/L   Alkaline Phosphatase 74 38 - 126 U/L   Total Bilirubin 1.0 0.3 - 1.2 mg/dL   GFR calc non Af Amer >60 >60 mL/min   GFR calc Af Amer >60 >60 mL/min    Comment: (NOTE) The eGFR has been calculated using the CKD EPI equation. This calculation has not been validated in all clinical situations. eGFR's persistently <60 mL/min signify possible Chronic Kidney Disease.    Anion gap 6 5 - 15  Lipase, blood     Status: None   Collection Time: 08/20/15  5:16 AM  Result Value Ref Range   Lipase 25 11 - 51 U/L     Lipid Panel     Component Value Date/Time  CHOL 126 08/14/2015 0408   TRIG 43 08/14/2015 0408   HDL 41 08/14/2015 0408   CHOLHDL 3.1 08/14/2015 0408   VLDL 9 08/14/2015 0408   LDLCALC 76 08/14/2015 0408     Lab Results  Component Value Date   HGBA1C 5.0 08/13/2015   HGBA1C 5.2 09/06/2014   HGBA1C 5.5 02/24/2014         HPI :  35 y.o. white male who presented 2 nights ago sudden onset of acute epigastric pain nausea with dry he is radiating to the back. He was found have lipase greater than 800 with normal liver function tests, CT the abdomen showing changes the pancreas consistent with pancreatitis with no biliary abnormalities. Abdominal ultrasound showed only a 3 mm gallbladder polyp. Patient admits to drinking too strong vodka drinks a night on Fridays and Saturdays and otherwise drinks very little. He said no history of hyperlipidemia. He is on hydrochlorothiazide which been on for about 2 years. He has had a rise in WBC count from 11,300-17,502 days. He has been afebrile. Gastroenterology was consulted due to slower than expected clinical progress.CT A/p Acute pancreatitis without evidence of complication by CT. Minimal sigmoid diverticulosis. No gallstones or evidence of biliary obstruction   HOSPITAL COURSE:   Acute alcoholic Pancreatitis: evolving phlegmon/fluid collections Patient continues to improve clinically every day, started on a low-fat diet which he is tolerating well Lipase 980 on admission, last checked was 25, prior to discharge Bilirubin trending down White count has increased from 11.3-17.5, concern for pancreatic necrosis, white count improved after starting on Zosyn on 7/5, white blood cell count is 7.5 today GI recommends to discontinue Zosyn if continued improvement   Initial CT abdomen showing peripancreatic edema without complication at this point No history of previous pancreatic flare. Patient is social ETOH drinker.  Lipid panel does not show hypertriglyceridemia, recheck lipase Discontinued HCTZ as a possible culprit, , right upper quadrant ultrasound shows a small polyp no gallstones , GI explained to the family that this has a very low risk of developing into gallbladder cancer GI also recommended stopping HCTZ, alcohol Dr. Cristina Gong to make further  recommendations -likely will not need repeat CT in the hospital but may need 1 in the outpatient setting to ensure complete resolution of fluid collections/confirm absence of pseudocyst Follow-up with Dr. Amedeo Plenty   Leukocytosis, likely reactive and recent steroidswhite count has increased from 11.3-17.5, possibly due to phlegmon, now 7.5    Hypertension BP Controlled, discontinue HCTZ, continue bisoprolol  Sigmoid diverticulosis-avoid constipation   Hyperlipidemia Not on statin  Triglycerides 43, LDL 76, low-fat diet  Hypokalemia, may be due to diuretics. Repleted, KCL IV  Follow CMP   Hyperglycemia Current blood sugar level is 132 Hemoglobin A1c 5.0   Anxiety Continue Xanax             Discharge Exam:    Blood pressure 125/76, pulse 80, temperature 97.5 F (36.4 C), temperature source Oral, resp. rate 17, height 6' (1.829 m), weight 92.4 kg (203 lb 11.3 oz), SpO2 100 %.   General exam: Appears calm and comfortable  Respiratory system: Clear to auscultation. Respiratory effort normal. Cardiovascular system: S1 & S2 heard, RRR. No JVD, murmurs, rubs, gallops or clicks. No pedal edema. Gastrointestinal system: Abdomen is nondistended, soft and nontender. No organomegaly or masses felt. Normal bowel sounds heard. Central nervous system: Alert and oriented. No focal neurological deficits. Extremities: Symmetric 5 x 5 power. Skin: No rashes, lesions or ulcers Psychiatry: Judgement and insight appear  normal. Mood & affect appropriate.    Follow-up Information    Follow up with MCKEOWN,[name], MD. Schedule an appointment as soon as possible for a visit in 3 days.   Specialty:  Internal Medicine   Why:  Hospital follow-up for acute pancreatitis, please call and make appointment   Contact information:   [address] [place] [address] [phone]       Follow up with HAYES,JOHN C, MD. Schedule an appointment as soon as possible  for a visit in 2 weeks.   Specialty:  Gastroenterology   Why:  Hospital follow-up for acute   pancreatitis   Contact information:   [address] [address] [phone]       Signed: Reyne Dumas 08/20/2015, 8:12 AM        Time spent >45 mins

## 2015-08-21 ENCOUNTER — Encounter: Payer: Self-pay | Admitting: Internal Medicine

## 2015-08-21 ENCOUNTER — Ambulatory Visit (INDEPENDENT_AMBULATORY_CARE_PROVIDER_SITE_OTHER): Payer: 59 | Admitting: Internal Medicine

## 2015-08-21 VITALS — BP 126/84 | HR 80 | Temp 97.4°F | Resp 16 | Ht 72.75 in | Wt 199.0 lb

## 2015-08-21 DIAGNOSIS — Z79899 Other long term (current) drug therapy: Secondary | ICD-10-CM | POA: Diagnosis not present

## 2015-08-21 DIAGNOSIS — K859 Acute pancreatitis, unspecified: Secondary | ICD-10-CM

## 2015-08-21 DIAGNOSIS — F411 Generalized anxiety disorder: Secondary | ICD-10-CM | POA: Diagnosis not present

## 2015-08-21 LAB — CBC WITH DIFFERENTIAL/PLATELET
BASOS ABS: 0 {cells}/uL (ref 0–200)
Basophils Relative: 0 %
Eosinophils Absolute: 130 cells/uL (ref 15–500)
Eosinophils Relative: 2 %
HCT: 43.8 % (ref 38.5–50.0)
HEMOGLOBIN: 14.7 g/dL (ref 13.2–17.1)
LYMPHS ABS: 715 {cells}/uL — AB (ref 850–3900)
Lymphocytes Relative: 11 %
MCH: 29.7 pg (ref 27.0–33.0)
MCHC: 33.6 g/dL (ref 32.0–36.0)
MCV: 88.5 fL (ref 80.0–100.0)
MONOS PCT: 13 %
MPV: 8.7 fL (ref 7.5–12.5)
Monocytes Absolute: 845 cells/uL (ref 200–950)
NEUTROS ABS: 4810 {cells}/uL (ref 1500–7800)
NEUTROS PCT: 74 %
Platelets: 354 10*3/uL (ref 140–400)
RBC: 4.95 MIL/uL (ref 4.20–5.80)
RDW: 12.9 % (ref 11.0–15.0)
WBC: 6.5 10*3/uL (ref 3.8–10.8)

## 2015-08-21 LAB — HEPATIC FUNCTION PANEL
ALBUMIN: 3.7 g/dL (ref 3.6–5.1)
ALT: 62 U/L — AB (ref 9–46)
AST: 60 U/L — AB (ref 10–40)
Alkaline Phosphatase: 90 U/L (ref 40–115)
Bilirubin, Direct: 0.2 mg/dL (ref <=0.2)
Indirect Bilirubin: 0.4 mg/dL (ref 0.2–1.2)
Total Bilirubin: 0.6 mg/dL (ref 0.2–1.2)
Total Protein: 7.1 g/dL (ref 6.1–8.1)

## 2015-08-21 LAB — BASIC METABOLIC PANEL WITH GFR
BUN: 13 mg/dL (ref 7–25)
CHLORIDE: 95 mmol/L — AB (ref 98–110)
CO2: 24 mmol/L (ref 20–31)
CREATININE: 0.68 mg/dL (ref 0.60–1.35)
Calcium: 9.1 mg/dL (ref 8.6–10.3)
GFR, Est African American: >89 mL/min (ref >=60)
GFR, Est Non African American: >89 mL/min (ref >=60)
Glucose, Bld: 85 mg/dL (ref 65–99)
Potassium: 4.5 mmol/L (ref 3.5–5.3)
Sodium: 134 mmol/L — ABNORMAL LOW (ref 135–146)

## 2015-08-21 LAB — AMYLASE: AMYLASE: 53 U/L (ref 0–105)

## 2015-08-21 MED ORDER — ALPRAZOLAM 1 MG PO TABS: ORAL_TABLET | ORAL | Status: DC

## 2015-08-21 NOTE — Patient Instructions (Signed)
Low-Fat Diet for Pancreatitis or Gallbladder Conditions A low-fat diet can be helpful if you have pancreatitis or a gallbladder condition. With these conditions, your pancreas and gallbladder have trouble digesting fats. A healthy eating plan with less fat will help rest your pancreas and gallbladder and reduce your symptoms. WHAT DO I NEED TO KNOW ABOUT THIS DIET?  Eat a low-fat diet.  Reduce your fat intake to less than 20-30% of your total daily calories. This is less than 50-60 g of fat per day.  Remember that you need some fat in your diet. Ask your dietician what your daily goal should be.  Choose nonfat and low-fat healthy foods. Look for the words "nonfat," "low fat," or "fat free."  As a guide, look on the label and choose foods with less than 3 g of fat per serving. Eat only one serving.  Avoid alcohol.  Do not smoke. If you need help quitting, talk with your health care provider.  Eat small frequent meals instead of three large heavy meals. WHAT FOODS CAN I EAT? Grains Include healthy grains and starches such as potatoes, wheat bread, fiber-rich cereal, and brown rice. Choose whole grain options whenever possible. In adults, whole grains should account for 45-65% of your daily calories.  Fruits and Vegetables Eat plenty of fruits and vegetables. Fresh fruits and vegetables add fiber to your diet. Meats and Other Protein Sources Eat lean meat such as chicken and pork. Trim any fat off of meat before cooking it. Eggs, fish, and beans are other sources of protein. In adults, these foods should account for 10-35% of your daily calories. Dairy Choose low-fat milk and dairy options. Dairy includes fat and protein, as well as calcium.  Fats and Oils Limit high-fat foods such as fried foods, sweets, baked goods, sugary drinks.  Other Creamy sauces and condiments, such as mayonnaise, can add extra fat. Think about whether or not you need to use them, or use smaller amounts or low fat  options. WHAT FOODS ARE NOT RECOMMENDED?  High fat foods, such as:  [employer] Corporation[employer]aked goods.  Ice cream.  [place] toast.  Sweet rolls.  Pizza.  Cheese bread.  Foods covered with batter, butter, creamy sauces, or cheese.  Fried foods.  Sugary drinks and desserts.  Foods that cause gas or bloating   ++++++++++++++++++++++++++++++++++++++++++++++++++++ Acute Pancreatitis Acute pancreatitis is a disease in which the pancreas becomes suddenly inflamed. The pancreas is a large gland located behind your stomach. The pancreas produces enzymes that help digest food. The pancreas also releases the hormones glucagon and insulin that help regulate blood sugar. Damage to the pancreas occurs when the digestive enzymes from the pancreas are activated and begin attacking the pancreas before being released into the intestine. Most acute attacks last a couple of days and can cause serious complications. Some people become dehydrated and develop low blood pressure. In severe cases, bleeding into the pancreas can lead to shock and can be life-threatening. The lungs, heart, and kidneys may fail. CAUSES  Pancreatitis can happen to anyone. In some cases, the cause is unknown. Most cases are caused by:  Alcohol abuse.  Gallstones. Other less common causes are:  Certain medicines.  Exposure to certain chemicals.  Infection.  Damage caused by an accident (trauma).  Abdominal surgery. SYMPTOMS   Pain in the upper abdomen that may radiate to the back.  Tenderness and swelling of the abdomen.  Nausea and vomiting. DIAGNOSIS  Your caregiver will perform a physical exam. Blood and stool tests  may be done to confirm the diagnosis. Imaging tests may also be done, such as X-rays, CT scans, or an ultrasound of the abdomen. TREATMENT  Treatment usually requires a stay in the hospital. Treatment may include:  Pain medicine.  Fluid replacement through an intravenous line (IV).  Placing a tube in the  stomach to remove stomach contents and control vomiting.  Not eating for 3 or 4 days. This gives your pancreas a rest, because enzymes are not being produced that can cause further damage.  Antibiotic medicines if your condition is caused by an infection.  Surgery of the pancreas or gallbladder. HOME CARE INSTRUCTIONS   Follow the diet advised by your caregiver. This may involve avoiding alcohol and decreasing the amount of fat in your diet.  Eat smaller, more frequent meals. This reduces the amount of digestive juices the pancreas produces.  Drink enough fluids to keep your urine clear or pale yellow.  Only take over-the-counter or prescription medicines as directed by your caregiver.  Avoid drinking alcohol if it caused your condition.  Do not smoke.  Get plenty of rest.  Check your blood sugar at home as directed by your caregiver.  Keep all follow-up appointments as directed by your caregiver. SEEK MEDICAL CARE IF:   You do not recover as quickly as expected.  You develop new or worsening symptoms.  You have persistent pain, weakness, or nausea.  You recover and then have another episode of pain. SEEK IMMEDIATE MEDICAL CARE IF:   You are unable to eat or keep fluids down.  Your pain becomes severe.  You have a fever or persistent symptoms for more than 2 to 3 days.  You have a fever and your symptoms suddenly get worse.  Your skin or the white part of your eyes turn yellow (jaundice).  You develop vomiting.  You feel dizzy, or you faint.  Your blood sugar is high (over 300 mg/dL). MAKE SURE YOU:   Understand these instructions.  Will watch your condition.  Will get help right away if you are not doing well or get worse.   This information is not intended to replace advice given to you by your health care provider. Make sure you discuss any questions you have with your health care provider.   Document Released: 01/27/2005 Document Revised: 07/29/2011  Document Reviewed: 05/08/2011 Elsevier Interactive Patient Education [employer]

## 2015-08-21 NOTE — Progress Notes (Signed)
  Subjective:    Patient ID: Shane Nguyen, male    DOB: 12/16/1980, 35 y.o.   MRN: [id number]  HPI  This very nice 35 yo MWM was recently hospitalized with Acute pancreatitis 7/3-11/2015 suspect for Alcoholic, as CT scan r/o gallstones (altho may have passed a stone). Alcohol use was reported as weekend usage. HCTZ was stopped in the hospital and BP med switched to Bisoprolol. Patient presents to day 1 day post hospital with c/o intermittent sweats and som nausea and "pain" which he can tolerate with the prescribed Vicodin.   Medication Sig  . bisoprolol  5 MG Take 1 tablet (5 mg total) by mouth daily.  Marland Kitchen. FLAXSEED OIL 1000 MG Take by mouth. Reported on 08/13/2015  . NORCO 5-325 MG Take 1 tablet by mouth every 6 (six) hours as needed for moderate pain.  Marland Kitchen. lipase/protease/amylase (CREON) 36000 UNITS CPEP  Take 1 cap (36,000 Units total)  3  times daily with meals.  . Melatonin 10 MG TABS Take 1 tablet by mouth at bedtime.  . multivitamin  Take 1 tablet by mouth daily.  Marland Kitchen. ALPRAZolam  1 MG tablet TAKE ONE TABLET BY MOUTH THREE TIMES DAILY AS NEEDED FOR ANXIETY   . folic acid  1 MG tablet Take 1 tablet (1 mg total) by mouth daily. (Patient not taking: Reported on 08/21/2015)  . psyllium 0.52 g capsule Take 3.12 g by mouth daily. Reported on 08/21/2015  . ranitidine 150 MG capsule Take 150 mg by mouth 2 (two) times daily. Reported on 08/21/2015  . thiamine 100 MG tablet Take 1 tablet (100 mg total) by mouth daily. (Patient not taking: Reported on 08/21/2015)   No Known Allergies   Past Medical History  Diagnosis Date  . Hypertension   . Hemorrhoids   . Hyperlipidemia   . Vitamin D deficiency   . Insomnia    Past Surgical History  Procedure Laterality Date  . Anterior cruciate ligament repair      bilateral  . Knee arthroscopy Right 1997    murphy   Review of Systems   10 point systems review negative except as above.    Objective:   Physical Exam  BP 126/84 mmHg  Pulse 80  Temp(Src) 97.4  F (36.3 C)  Resp 16  Ht 6' 0.75" (1.848 m)  Wt 199 lb (90.266 kg)  BMI 26.43 kg/m2  HEENT - Eac's patent. TM's Nl. EOM's full. PERRLA. NasoOroPharynx clear. Neck - supple. Nl Thyroid. Carotids 2+ & No bruits, nodes, JVD Chest - Clear equal BS w/o Rales, rhonchi, wheezes. Cor - Nl HS. RRR w/o sig MGR. No edema. Abd - Soft with No palpable organomegaly, masses or tenderness. BS nl. MS- FROM w/o deformities. Muscle power, tone and bulk Nl. Gait Nl. Neuro - No obvious Cr N abnormalities. Sensory, motor and Cerebellar functions appear Nl w/o focal abnormalities.    Assessment & Plan:   1. Acute pancreatitis  - Amylase - avoid alcohol - discussed bland , low fat diet   2. Anxiety state  - ALPRAZolam (XANAX) 1 MG tablet; Take 1/2 to 1 tablet    3  x /day if needed for anxiety  Dispense: 90 tablet; Refill: 0  3. Medication management - CBC with Differential/Platelet - BASIC METABOLIC PANEL WITH GFR - Hepatic function panel

## 2015-08-24 ENCOUNTER — Encounter: Payer: Self-pay | Admitting: Internal Medicine

## 2015-08-24 ENCOUNTER — Ambulatory Visit (INDEPENDENT_AMBULATORY_CARE_PROVIDER_SITE_OTHER): Payer: 59 | Admitting: Internal Medicine

## 2015-08-24 VITALS — BP 126/90 | HR 63 | Temp 97.5°F | Resp 14 | Ht 72.75 in | Wt 196.0 lb

## 2015-08-24 DIAGNOSIS — K858 Other acute pancreatitis without necrosis or infection: Secondary | ICD-10-CM | POA: Diagnosis not present

## 2015-08-24 DIAGNOSIS — K859 Acute pancreatitis, unspecified: Secondary | ICD-10-CM

## 2015-08-24 NOTE — Progress Notes (Signed)
  Subjective:    Patient ID: Shane AlpersJames Allums, male    DOB: 05/09/1980, 35 y.o.   MRN: [id number]  HPI  This nice 35 yo MWM is seen in 2sd f/u after recent hospitalization for acute pancreatitis. [facility] had repeat labs showing Nl CBC, BMET  & Amylase with sl elevation of LFT's. Since then, patient has been advancing diet w/o difficulty and abdominal discomfort has all resolved. Pt understands importance of ongoing alcohol abstinence.   Medication Sig  . ALPRAZolam  1 MG  Take 1/2 to 1 tablet      3  x /day if needed for anxiety  . bisoprolol 5 MG Take 1 tablet (5 mg total) by mouth daily.  Marland Kitchen. FLAXSEED OIL 1000 MG Take by mouth. Reported on 08/13/2015  . folic acid  1 MG Take 1 tablet (1 mg total) by mouth daily.  . NORCO 5-325 MG  Take 1 tablet by mouth every 6 (six) hours as needed for moderate pain.  Marland Kitchen. CREON 36000 UNITS Take 1 capsule (36,000 Units total) by mouth 3 (three) times daily with meals.  . Melatonin 10 MG  Take 1 tablet by mouth at bedtime.  . multivitamin  Take 1 tablet by mouth daily.  . psyllium (REGULOID) 0.52 g  Take 3.12 g by mouth daily. Reported on 08/21/2015  . ranitidine  150 MG  Take 150 mg by mouth 2 (two) times daily. Reported on 08/21/2015  . thiamine 100 MG Take 1 tablet (100 mg total) by mouth daily.   No Known Allergies   Past Medical History  Diagnosis Date  . Hypertension   . Hemorrhoids   . Hyperlipidemia   . Vitamin D deficiency   . Insomnia    Review of Systems  10 point systems review negative except as above.    Objective:   Physical Exam  BP 126/90   Pulse 63  Temp 97.5 F   Resp 14  Ht 6' 0.75"   Wt 196 lb   BMI 26.03   SpO2 97%  HEENT - WNL. Neck - supple.  Chest - Clear. Cor - Nl HS. RRR w/o sig MGR.  Abd - No palpable organomegaly, masses or tenderness. BS nl. MS- FROM w/o deformities. Muscle power, tone and bulk Nl. Gait Nl. Neuro - Nl w/o focal abnormalities.    Assessment & Plan:   1. Acute pancreatitis,  resolved  - continue slow diet advance , alcohol avoidance & continue Creon til see GI Dr.   - given work note to be OOW 7/3- 08/27/2015

## 2015-08-25 ENCOUNTER — Encounter: Payer: Self-pay | Admitting: Internal Medicine

## 2015-09-24 ENCOUNTER — Ambulatory Visit (INDEPENDENT_AMBULATORY_CARE_PROVIDER_SITE_OTHER): Payer: 59 | Admitting: Internal Medicine

## 2015-09-24 ENCOUNTER — Encounter: Payer: Self-pay | Admitting: Internal Medicine

## 2015-09-24 VITALS — BP 110/68 | HR 68 | Temp 97.0°F | Resp 16 | Ht 73.0 in | Wt 208.4 lb

## 2015-09-24 DIAGNOSIS — I10 Essential (primary) hypertension: Secondary | ICD-10-CM

## 2015-09-24 DIAGNOSIS — Z0001 Encounter for general adult medical examination with abnormal findings: Secondary | ICD-10-CM

## 2015-09-24 DIAGNOSIS — R6889 Other general symptoms and signs: Secondary | ICD-10-CM | POA: Diagnosis not present

## 2015-09-24 DIAGNOSIS — Z111 Encounter for screening for respiratory tuberculosis: Secondary | ICD-10-CM | POA: Diagnosis not present

## 2015-09-24 DIAGNOSIS — Z79899 Other long term (current) drug therapy: Secondary | ICD-10-CM

## 2015-09-24 DIAGNOSIS — R7309 Other abnormal glucose: Secondary | ICD-10-CM

## 2015-09-24 DIAGNOSIS — E559 Vitamin D deficiency, unspecified: Secondary | ICD-10-CM

## 2015-09-24 DIAGNOSIS — R5383 Other fatigue: Secondary | ICD-10-CM

## 2015-09-24 DIAGNOSIS — K219 Gastro-esophageal reflux disease without esophagitis: Secondary | ICD-10-CM

## 2015-09-24 DIAGNOSIS — E785 Hyperlipidemia, unspecified: Secondary | ICD-10-CM

## 2015-09-24 DIAGNOSIS — Z136 Encounter for screening for cardiovascular disorders: Secondary | ICD-10-CM | POA: Diagnosis not present

## 2015-09-24 LAB — CBC WITH DIFFERENTIAL/PLATELET
BASOS PCT: 1 %
Basophils Absolute: 36 cells/uL (ref 0–200)
Eosinophils Absolute: 108 cells/uL (ref 15–500)
Eosinophils Relative: 3 %
HEMATOCRIT: 43.5 % (ref 38.5–50.0)
Hemoglobin: 14.3 g/dL (ref 13.2–17.1)
LYMPHS ABS: 972 {cells}/uL (ref 850–3900)
MCH: 28.8 pg (ref 27.0–33.0)
MCHC: 32.9 g/dL (ref 32.0–36.0)
MCV: 87.7 fL (ref 80.0–100.0)
MONO ABS: 432 {cells}/uL (ref 200–950)
MONOS PCT: 12 %
MPV: 9.8 fL (ref 7.5–12.5)
Neutro Abs: 2052 cells/uL (ref 1500–7800)
Neutrophils Relative %: 57 %
PLATELETS: 192 10*3/uL (ref 140–400)
RBC: 4.96 MIL/uL (ref 4.20–5.80)
RDW: 13.7 % (ref 11.0–15.0)
WBC: 3.6 10*3/uL — AB (ref 3.8–10.8)

## 2015-09-24 LAB — BASIC METABOLIC PANEL WITH GFR
BUN: 10 mg/dL (ref 7–25)
CHLORIDE: 102 mmol/L (ref 98–110)
CO2: 26 mmol/L (ref 20–31)
Calcium: 9.3 mg/dL (ref 8.6–10.3)
Creat: 0.83 mg/dL (ref 0.60–1.35)
Glucose, Bld: 77 mg/dL (ref 65–99)
POTASSIUM: 4.2 mmol/L (ref 3.5–5.3)
Sodium: 139 mmol/L (ref 135–146)

## 2015-09-24 LAB — IRON AND TIBC
%SAT: 31 % (ref 15–60)
Iron: 89 ug/dL (ref 50–180)
TIBC: 283 ug/dL (ref 250–425)
UIBC: 194 ug/dL (ref 125–400)

## 2015-09-24 LAB — HEPATIC FUNCTION PANEL
ALBUMIN: 4.3 g/dL (ref 3.6–5.1)
ALK PHOS: 45 U/L (ref 40–115)
ALT: 11 U/L (ref 9–46)
AST: 16 U/L (ref 10–40)
BILIRUBIN INDIRECT: 0.3 mg/dL (ref 0.2–1.2)
BILIRUBIN TOTAL: 0.4 mg/dL (ref 0.2–1.2)
Bilirubin, Direct: 0.1 mg/dL (ref <=0.2)
Total Protein: 6.8 g/dL (ref 6.1–8.1)

## 2015-09-24 LAB — LIPID PANEL
CHOL/HDL RATIO: 3.4 ratio (ref <=5.0)
Cholesterol: 141 mg/dL (ref 125–200)
HDL: 41 mg/dL (ref >=40)
LDL CALC: 85 mg/dL (ref <130)
TRIGLYCERIDES: 77 mg/dL (ref <150)
VLDL: 15 mg/dL (ref <30)

## 2015-09-24 LAB — MAGNESIUM: Magnesium: 1.8 mg/dL (ref 1.5–2.5)

## 2015-09-24 LAB — TSH: TSH: 0.99 m[IU]/L (ref 0.40–4.50)

## 2015-09-24 LAB — VITAMIN B12: Vitamin B-12: 491 pg/mL (ref 200–1100)

## 2015-09-24 NOTE — Progress Notes (Signed)
[facility]   Shane Nguyen, M.D.    Dyanne CarrelAmanda R. Steffanie Dunnollier, P.A.-C      Terri Piedraourtney Forcucci, P.A.-C   [facility]                [address]                [place], [place] [id number] Telephone [phone] Telefax [phone]  Annual  Screening/Preventative Visit And Comprehensive Evaluation & Examination     This very nice 34y.o. MWM presents for a Wellness/Preventative Visit & comprehensive evaluation and management of multiple medical co-morbidities.  Patient has been followed for HTN, Prediabetes, Hyperlipidemia and Vitamin D Deficiency. Patient was hospitalized in July with acute pancreatitis suspected consequent of alcoholic vs. Passed gallstone. Patient does have hx/o GERD controlled on Ranitidine on a prn or occasional basis.     HTN predates since  Oct 2011. Patient's BP has been controlled at home.Today's BP: 110/68. Patient denies any cardiac symptoms as chest pain, palpitations, shortness of breath, dizziness or ankle swelling.    Patient's hyperlipidemia is controlled with diet and medications. Patient denies myalgias or other medication SE's. Last lipids were  Lab Results  Component Value Date   CHOL 126 08/14/2015   HDL 41 08/14/2015   LDLCALC 76 08/14/2015   TRIG 43 08/14/2015   CHOLHDL 3.1 08/14/2015      Patient has hx/o Morbid obesity (lost weight since recent hospitalization) & is screened for abnormal glucose and patient denies reactive hypoglycemic symptoms, visual blurring, diabetic polys or paresthesias. Last A1c was  Lab Results  Component Value Date   HGBA1C 5.0 08/13/2015       Finally, patient has history of Vitamin D Deficiency of 49 on treatment in 2011 and last vitamin D was  Lab Results  Component Value Date   VD25OH 5454 09/06/2014   Current Outpatient Prescriptions on File Prior to Visit  Medication Sig  . ALPRAZolam (XANAX) 1 MG tablet Take 1/2 to 1 tablet      3  x /day if needed  for anxiety  . bisoprolol (ZEBETA) 5 MG tablet Take 1 tablet (5 mg total) by mouth daily.  . Melatonin 10 MG TABS Take 1 tablet by mouth at bedtime.  . multivitamin  TABS tablet Take 1 tablet by mouth daily.   bASA 81 mg  Take 1 daily    Vitamin D    ? Dose  Takes 1 cap daily  . ranitidine (ZANTAC) 150 MG capsule Take 150 mg by mouth 2 (two) times daily.    No Known Allergies   Past Medical History:  Diagnosis Date  . Hemorrhoids   . Hyperlipidemia   . Hypertension   . Insomnia   . Vitamin D deficiency    Health Maintenance  Topic Date Due  . INFLUENZA VACCINE  09/11/2015  . TETANUS/TDAP  03/11/2025  . HIV Screening  Completed   Immunization History  Administered Date(s) Administered  . PPD Test 09/02/2013, 09/06/2014  . Pneumococcal-Unspecified 11/21/2009  . Tdap 11/21/2009, 03/12/2015   Past Surgical History:  Procedure Laterality Date  . ANTERIOR CRUCIATE LIGAMENT REPAIR     bilateral  . KNEE ARTHROSCOPY Right 1997   murphy   Family History  Problem Relation Age of Onset  . Hypertension Father   . Hyperlipidemia Father    Social History   Social History  . Marital status: Married    Spouse name: N/A  . Number of children: N/A  .  Years of education: N/A   Occupational History  . 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 Smoking status: Former Smoker    Packs/day: 1.00    Years: 10.00    Quit date: 02/11/2004  . Smokeless tobacco: Never Used  . Alcohol use Stopped since hosp w/pancreatitis  . Drug use: No  . Sexual activity: Active    ROS Constitutional: Denies fever, chills, weight loss/gain, headaches, insomnia,  night sweats or change in appetite. Does c/o fatigue. Eyes: Denies redness, blurred vision, diplopia, discharge, itchy or watery eyes.  ENT: Denies discharge, congestion, post nasal drip, epistaxis, sore throat, earache, hearing loss, dental pain, Tinnitus, Vertigo, Sinus pain or snoring.  Cardio:  Denies chest pain, palpitations, irregular heartbeat, syncope, dyspnea, diaphoresis, orthopnea, PND, claudication or edema Respiratory: denies cough, dyspnea, DOE, pleurisy, hoarseness, laryngitis or wheezing.  Gastrointestinal: Denies dysphagia, heartburn, reflux, water brash, pain, cramps, nausea, vomiting, bloating, diarrhea, constipation, hematemesis, melena, hematochezia, jaundice or hemorrhoids Genitourinary: Denies dysuria, frequency, urgency, nocturia, hesitancy, discharge, hematuria or flank pain Musculoskeletal: Denies arthralgia, myalgia, stiffness, Jt. Swelling, pain, limp or strain/sprain. Denies Falls. Skin: Denies puritis, rash, hives, warts, acne, eczema or change in skin lesion Neuro: No weakness, tremor, incoordination, spasms, paresthesia or pain Psychiatric: Denies confusion, memory loss or sensory loss. Denies Depression. Endocrine: Denies change in weight, skin, hair change, nocturia, and paresthesia, diabetic polys, visual blurring or hyper / hypo glycemic episodes.  Heme/Lymph: No excessive bleeding, bruising or enlarged lymph nodes.  Physical Exam  BP 110/68   Pulse 68   Temp 97 F (36.1 C)   Resp 16   Ht 6\' 1"  (1.854 m)   Wt 208 lb 6.4 oz (94.5 kg)   BMI 27.50 kg/m   General Appearance: Well nourished, in no apparent distress. Eyes: PERRLA, EOMs, conjunctiva no swelling or erythema, normal fundi and vessels. Sinuses: No frontal/maxillary tenderness ENT/Mouth: EACs patent / TMs  nl. Nares clear without erythema, swelling, mucoid exudates. Oral hygiene is good. No erythema, swelling, or exudate. Tongue normal, non-obstructing. Tonsils not swollen or erythematous. Hearing normal.  Neck: Supple, thyroid normal. No bruits, nodes or JVD. Respiratory: Respiratory effort normal.  BS equal and clear bilateral without rales, rhonci, wheezing or stridor. Cardio: Heart sounds are normal with regular rate and rhythm and no murmurs, rubs or gallops. Peripheral pulses are  normal and equal bilaterally without edema. No aortic or femoral bruits. Chest: symmetric with normal excursions and percussion.  Abdomen: Soft, with Nl bowel sounds. Nontender, no guarding, rebound, hernias, masses, or organomegaly.  Lymphatics: Non tender without lymphadenopathy.  Genitourinary:  Deferred Musculoskeletal: Full ROM all peripheral extremities, joint stability, 5/5 strength, and normal gait. Skin: Warm and dry without rashes, lesions, cyanosis, clubbing or  ecchymosis.  Neuro: Cranial nerves intact, reflexes equal bilaterally. Normal muscle tone, no cerebellar symptoms. Sensation intact.  Pysch: Alert and oriented X 3 with normal affect, insight and judgment appropriate.   Assessment and Plan  1. Annual Preventative/Screening Exam   - Microalbumin / creatinine urine ratio - EKG 12-Lead - Urinalysis, Routine w reflex microscopic - Iron and TIBC - Vitamin B12 - Testosterone - CBC with Differential/Platelet - BASIC METABOLIC PANEL WITH GFR - Hepatic function panel - Magnesium - Lipid panel - TSH - Hemoglobin A1c - Insulin, random - VITAMIN D 25 Hydroxy   2. Essential hypertension  - Microalbumin / creatinine urine ratio - EKG 12-Lead - TSH  3. Hyperlipidemia  - Lipid panel - TSH  4. Abnormal glucose  - Hemoglobin A1c - Insulin, random  5. Vitamin D deficiency  - VITAMIN D 25 Hydroxy   6. Gastroesophageal reflux disease   7. Screening for ischemic heart disease   8. Other fatigue  - Iron and TIBC - Vitamin B12 - Testosterone - CBC with Differential/Platelet - TSH  9. Medication management  - Urinalysis, Routine w reflex microscopic - CBC with Differential/Platelet - BASIC METABOLIC PANEL WITH GFR - Hepatic function panel - Magnesium     Continue prudent diet as discussed, weight control, BP monitoring, regular exercise, and medications as discussed.  Discussed med effects and SE's. Routine screening labs and tests as requested with  regular follow-up as recommended. Over 40 minutes of exam, counseling, chart review and high complex critical decision making was performed

## 2015-09-24 NOTE — Patient Instructions (Signed)
Preventive Care for Adults  A healthy lifestyle and preventive care can promote health and wellness. Preventive health guidelines for men include the following key practices:  A routine yearly physical is a good way to check with your health care provider about your health and preventative screening. It is a chance to share any concerns and updates on your health and to receive a thorough exam.  Visit your dentist for a routine exam and preventative care every 6 months. Brush your teeth twice a day and floss once a day. Good oral hygiene prevents tooth decay and gum disease.  The frequency of eye exams is based on your age, health, family medical history, use of contact lenses, and other factors. Follow your health care provider's recommendations for frequency of eye exams.  Eat a healthy diet. Foods such as vegetables, fruits, whole grains, low-fat dairy products, and lean protein foods contain the nutrients you need without too many calories. Decrease your intake of foods high in solid fats, added sugars, and salt. Eat the right amount of calories for you.Get information about a proper diet from your health care provider, if necessary.  Regular physical exercise is one of the most important things you can do for your health. Most adults should get at least 150 minutes of moderate-intensity exercise (any activity that increases your heart rate and causes you to sweat) each week. In addition, most adults need muscle-strengthening exercises on 2 or more days a week.  Maintain a healthy weight. The body mass index (BMI) is a screening tool to identify possible weight problems. It provides an estimate of body fat based on height and weight. Your health care provider can find your BMI and can help you achieve or maintain a healthy weight.For adults 20 years and older:  A BMI below 18.5 is considered underweight.  A BMI of 18.5 to 24.9 is normal.  A BMI of 25 to 29.9 is considered overweight.  A  BMI of 30 and above is considered obese.  Maintain normal blood lipids and cholesterol levels by exercising and minimizing your intake of saturated fat. Eat a balanced diet with plenty of fruit and vegetables. Blood tests for lipids and cholesterol should begin at age 20 and be repeated every 5 years. If your lipid or cholesterol levels are high, you are over 50, or you are at high risk for heart disease, you may need your cholesterol levels checked more frequently.Ongoing high lipid and cholesterol levels should be treated with medicines if diet and exercise are not working.  If you smoke, find out from your health care provider how to quit. If you do not use tobacco, do not start.  Lung cancer screening is recommended for adults aged 55-80 years who are at high risk for developing lung cancer because of a history of smoking. A yearly low-dose CT scan of the lungs is recommended for people who have at least a 30-pack-year history of smoking and are a current smoker or have quit within the past 15 years. A pack year of smoking is smoking an average of 1 pack of cigarettes a day for 1 year (for example: 1 pack a day for 30 years or 2 packs a day for 15 years). Yearly screening should continue until the smoker has stopped smoking for at least 15 years. Yearly screening should be stopped for people who develop a health problem that would prevent them from having lung cancer treatment.  If you choose to drink alcohol, do not have more   than 2 drinks per day. One drink is considered to be 12 ounces (355 mL) of beer, 5 ounces (148 mL) of wine, or 1.5 ounces (44 mL) of liquor.  High blood pressure causes heart disease and increases the risk of stroke. Your blood pressure should be checked. Ongoing high blood pressure should be treated with medicines, if weight loss and exercise are not effective.  If you are 38-65 years old, ask your health care provider if you should take aspirin to prevent heart  disease.  Diabetes screening involves taking a blood sample to check your fasting blood sugar level. Testing should be considered at a younger age or be carried out more frequently if you are overweight and have at least 1 risk factor for diabetes.  Colorectal cancer can be detected and often prevented. Most routine colorectal cancer screening begins at the age of 56 and continues through age 26. However, your health care provider may recommend screening at an earlier age if you have risk factors for colon cancer. On a yearly basis, your health care provider may provide home test kits to check for hidden blood in the stool. Use of a small camera at the end of a tube to directly examine the colon (sigmoidoscopy or colonoscopy) can detect the earliest forms of colorectal cancer. Talk to your health care provider about this at age 96, when routine screening begins. Direct exam of the colon should be repeated every 5-10 years through age 69, unless early forms of precancerous polyps or small growths are found.  Screening for abdominal aortic aneurysm (AAA)  are recommended for persons over age 25 who have history of hypertensionor who are current or former smokers.  Talk with your health care provider about prostate cancer screening.  Testicular cancer screening is recommended for adult males. Screening includes self-exam, a health care provider exam, and other screening tests. Consult with your health care provider about any symptoms you have or any concerns you have about testicular cancer.  Use sunscreen. Apply sunscreen liberally and repeatedly throughout the day. You should seek shade when your shadow is shorter than you. Protect yourself by wearing long sleeves, pants, a wide-brimmed hat, and sunglasses year round, whenever you are outdoors.  Once a month, do a whole-body skin exam, using a mirror to look at the skin on your back. Tell your health care provider about new moles, moles that have  irregular borders, moles that are larger than a pencil eraser, or moles that have changed in shape or color.  Stay current with required vaccines (immunizations).  Influenza vaccine. All adults should be immunized every year.  Tetanus, diphtheria, and acellular pertussis (Td, Tdap) vaccine. An adult who has not previously received Tdap or who does not know his vaccine status should receive 1 dose of Tdap. This initial dose should be followed by tetanus and diphtheria toxoids (Td) booster doses every 10 years. Adults with an unknown or incomplete history of completing a 3-dose immunization series with Td-containing vaccines should begin or complete a primary immunization series including a Tdap dose. Adults should receive a Td booster every 10 years.  Zoster vaccine. One dose is recommended for adults aged 26 years or older unless certain conditions are present.    Pneumococcal 13-valent conjugate (PCV13) vaccine. When indicated, a person who is uncertain of his immunization history and has no record of immunization should receive the PCV13 vaccine. An adult aged 52 years or older who has certain medical conditions and has not been previously immunized  should receive 1 dose of PCV13 vaccine. This PCV13 should be followed with a dose of pneumococcal polysaccharide (PPSV23) vaccine. The PPSV23 vaccine dose should be obtained at least 8 weeks after the dose of PCV13 vaccine. An adult aged 104 years or older who has certain medical conditions and previously received 1 or more doses of PPSV23 vaccine should receive 1 dose of PCV13. The PCV13 vaccine dose should be obtained 1 or more years after the last PPSV23 vaccine dose.    Pneumococcal polysaccharide (PPSV23) vaccine. When PCV13 is also indicated, PCV13 should be obtained first. All adults aged 71 years and older should be immunized. An adult younger than age 40 years who has certain medical conditions should be immunized. Any person who resides in a  nursing home or long-term care facility should be immunized. An adult smoker should be immunized. People with an immunocompromised condition and certain other conditions should receive both PCV13 and PPSV23 vaccines. People with human immunodeficiency virus (HIV) infection should be immunized as soon as possible after diagnosis. Immunization during chemotherapy or radiation therapy should be avoided. Routine use of PPSV23 vaccine is not recommended for American Indians, [place] Natives, or people younger than 65 years unless there are medical conditions that require PPSV23 vaccine. When indicated, people who have unknown immunization and have no record of immunization should receive PPSV23 vaccine. One-time revaccination 5 years after the first dose of PPSV23 is recommended for people aged 19-64 years who have chronic kidney failure, nephrotic syndrome, asplenia, or immunocompromised conditions. People who received 1-2 doses of PPSV23 before age 82 years should receive another dose of PPSV23 vaccine at age 75 years or later if at least 5 years have passed since the previous dose. Doses of PPSV23 are not needed for people immunized with PPSV23 at or after age 49 years.  Hepatitis A vaccine. Adults who wish to be protected from this disease, have certain high-risk conditions, work with hepatitis A-infected animals, work in hepatitis A research labs, or travel to or work in countries with a high rate of hepatitis A should be immunized. Adults who were previously unvaccinated and who anticipate close contact with an international adoptee during the first 60 days after arrival in the [place] from a country with a high rate of hepatitis A should be immunized.  Hepatitis B vaccine. Adults should be immunized if they wish to be protected from this disease, have certain high-risk conditions, may be exposed to blood or other infectious body fluids, are household contacts or sex partners of hepatitis B positive  people, are clients or workers in certain care facilities, or travel to or work in countries with a high rate of hepatitis B.  Preventive Service / Frequency  Ages 59 to 45  Blood pressure check.  Lipid and cholesterol check.  Hepatitis C blood test.** / For any individual with known risks for hepatitis C.  Skin self-exam. / Monthly.  Influenza vaccine. / Every year.  Tetanus, diphtheria, and acellular pertussis (Tdap, Td) vaccine.** / Consult your health care provider. 1 dose of Td every 10 years.  HPV vaccine. / 3 doses over 6 months, if 51 or younger.  Measles, mumps, rubella (MMR) vaccine.** / You need at least 1 dose of MMR if you were born in 1957 or later. You may also need a second dose.  Pneumococcal 13-valent conjugate (PCV13) vaccine.** / Consult your health care provider.  Pneumococcal polysaccharide (PPSV23) vaccine.** / 1 to 2 doses if you smoke cigarettes or if you have  certain conditions.  Meningococcal vaccine.** / 1 dose if you are age 19 to 21 years and a first-year college student living in a residence hall, or have one of several medical conditions. You may also need additional booster doses.  Hepatitis A vaccine.** / Consult your health care provider.  Hepatitis B vaccine.** / Consult your health care provider. +++++++++ Recommend Adult Low Dose Aspirin or  coated  Aspirin 81 mg daily  To reduce risk of Colon Cancer 20 %,  Skin Cancer 26 % ,  Melanoma 46%  and  Pancreatic cancer 60% ++++++++++++++++++ Vitamin D goal  is between 70-100.  Please make sure that you are taking your Vitamin D as directed.  It is very important as a natural anti-inflammatory  helping hair, skin, and nails, as well as reducing stroke and heart attack risk.  It helps your bones and helps with mood. It also decreases numerous cancer risks so please take it as directed.  Low Vit D is associated with a 200-300% higher risk for CANCER  and 200-300% higher risk for HEART    ATTACK  &  STROKE.   ...................................... It is also associated with higher death rate at younger ages,  autoimmune diseases like Rheumatoid arthritis, Lupus, Multiple Sclerosis.    Also many other serious conditions, like depression, Alzheimer's Dementia, infertility, muscle aches, fatigue, fibromyalgia - just to name a few. +++++++++++++++++++++ Recommend the book "The END of DIETING" by Dr Joel Fuhrman  & the book "The END of DIABETES " by Dr Joel Fuhrman At [employer] - get book & Audio CD's    Being diabetic has a  300% increased risk for heart attack, stroke, cancer, and alzheimer- type vascular dementia. It is very important that you work harder with diet by avoiding all foods that are white. Avoid white rice (brown & wild rice is OK), white potatoes (sweetpotatoes in moderation is OK), White bread or wheat bread or anything made out of white flour like bagels, donuts, rolls, buns, biscuits, cakes, pastries, cookies, pizza crust, and pasta (made from white flour & egg whites) - vegetarian pasta or spinach or wheat pasta is OK. Multigrain breads like Arnold's or [employer], or multigrain sandwich thins or flatbreads.  Diet, exercise and weight loss can reverse and cure diabetes in the early stages.  Diet, exercise and weight loss is very important in the control and prevention of complications of diabetes which affects every system in your body, ie. Brain - dementia/stroke, eyes - glaucoma/blindness, heart - heart attack/heart failure, kidneys - dialysis, stomach - gastric paralysis, intestines - malabsorption, nerves - severe painful neuritis, circulation - gangrene & loss of a leg(s), and finally cancer and Alzheimers.    I recommend avoid fried & greasy foods,  sweets/candy, white rice (brown or wild rice or Quinoa is OK), white potatoes (sweet potatoes are OK) - anything made from white flour - bagels, doughnuts, rolls, buns, biscuits,white and wheat breads, pizza crust  and traditional pasta made of white flour & egg white(vegetarian pasta or spinach or wheat pasta is OK).  Multi-grain bread is OK - like multi-grain flat bread or sandwich thins. Avoid alcohol in excess. Exercise is also important.    Eat all the vegetables you want - avoid meat, especially red meat and dairy - especially cheese.  Cheese is the most concentrated form of trans-fats which is the worst thing to clog up our arteries. Veggie cheese is OK which can be found in the fresh produce section at [employer] or   [employer] or [employer]  +++++++++++++++++++ DASH Eating Plan  DASH stands for "Dietary Approaches to Stop Hypertension."   The DASH eating plan is a healthy eating plan that has been shown to reduce high blood pressure (hypertension). Additional health benefits may include reducing the risk of type 2 diabetes mellitus, heart disease, and stroke. The DASH eating plan may also help with weight loss. WHAT DO I NEED TO KNOW ABOUT THE DASH EATING PLAN? For the DASH eating plan, you will follow these general guidelines:  Choose foods with a percent daily value for sodium of less than 5% (as listed on the food label).  Use salt-free seasonings or herbs instead of table salt or sea salt.  Check with your health care provider or pharmacist before using salt substitutes.  Eat lower-sodium products, often labeled as "lower sodium" or "no salt added."  Eat fresh foods.  Eat more vegetables, fruits, and low-fat dairy products.  Choose whole grains. Look for the word "whole" as the first word in the ingredient list.  Choose fish   Limit sweets, desserts, sugars, and sugary drinks.  Choose heart-healthy fats.  Eat veggie cheese   Eat more home-cooked food and less restaurant, buffet, and fast food.  Limit fried foods.  Cook foods using methods other than frying.  Limit canned vegetables. If you do use them, rinse them well to decrease the sodium.  When eating at a  restaurant, ask that your food be prepared with less salt, or no salt if possible.                      WHAT FOODS CAN I EAT? Read Dr Joel Fuhrman's books on The End of Dieting & The End of Diabetes  Grains Whole grain or whole wheat bread. Brown rice. Whole grain or whole wheat pasta. Quinoa, bulgur, and whole grain cereals. Low-sodium cereals. Corn or whole wheat flour tortillas. Whole grain cornbread. Whole grain crackers. Low-sodium crackers.  Vegetables Fresh or frozen vegetables (raw, steamed, roasted, or grilled). Low-sodium or reduced-sodium tomato and vegetable juices. Low-sodium or reduced-sodium tomato sauce and paste. Low-sodium or reduced-sodium canned vegetables.   Fruits All fresh, canned (in natural juice), or frozen fruits.  Protein Products  All fish and seafood.  Dried beans, peas, or lentils. Unsalted nuts and seeds. Unsalted canned beans.  Dairy Low-fat dairy products, such as skim or 1% milk, 2% or reduced-fat cheeses, low-fat ricotta or cottage cheese, or plain low-fat yogurt. Low-sodium or reduced-sodium cheeses.  Fats and Oils Tub margarines without trans fats. Light or reduced-fat mayonnaise and salad dressings (reduced sodium). Avocado. Safflower, olive, or canola oils. Natural peanut or almond butter.  Other Unsalted popcorn and pretzels. The items listed above may not be a complete list of recommended foods or beverages. Contact your dietitian for more options.  +++++++++++++++++++  WHAT FOODS ARE NOT RECOMMENDED? Grains/ White flour or wheat flour White bread. White pasta. White rice. Refined cornbread. Bagels and croissants. Crackers that contain trans fat.  Vegetables  Creamed or fried vegetables. Vegetables in a . Regular canned vegetables. Regular canned tomato sauce and paste. Regular tomato and vegetable juices.  Fruits Dried fruits. Canned fruit in light or heavy syrup. Fruit juice.  Meat and Other Protein Products Meat in general - RED  meat & White meat.  Fatty cuts of meat. Ribs, chicken wings, all processed meats as bacon, sausage, bologna, salami, fatback, hot dogs, bratwurst and packaged luncheon meats.  Dairy Whole or 2% milk, cream,   half-and-half, and cream cheese. Whole-fat or sweetened yogurt. Full-fat cheeses or blue cheese. Non-dairy creamers and whipped toppings. Processed cheese, cheese spreads, or cheese curds.  Condiments Onion and garlic salt, seasoned salt, table salt, and sea salt. Canned and packaged gravies. Worcestershire sauce. Tartar sauce. Barbecue sauce. Teriyaki sauce. Soy sauce, including reduced sodium. Steak sauce. Fish sauce. Oyster sauce. Cocktail sauce. Horseradish. Ketchup and mustard. Meat flavorings and tenderizers. Bouillon cubes. Hot sauce. Tabasco sauce. Marinades. Taco seasonings. Relishes.  Fats and Oils Butter, stick margarine, lard, shortening and bacon fat. Coconut, palm kernel, or palm oils. Regular salad dressings.  Pickles and olives. Salted popcorn and pretzels.  The items listed above may not be a complete list of foods and beverages to avoid.    

## 2015-09-24 NOTE — Addendum Note (Signed)
Addended by: Valrie HartEVANS, Juliett Eastburn C on: 09/24/2015 10:46 AM   Modules accepted: Orders

## 2015-09-25 LAB — URINALYSIS, ROUTINE W REFLEX MICROSCOPIC
BILIRUBIN URINE: NEGATIVE
GLUCOSE, UA: NEGATIVE
HGB URINE DIPSTICK: NEGATIVE
Ketones, ur: NEGATIVE
Leukocytes, UA: NEGATIVE
Nitrite: NEGATIVE
PH: 6.5 (ref 5.0–8.0)
PROTEIN: NEGATIVE
Specific Gravity, Urine: 1.021 (ref 1.001–1.035)

## 2015-09-25 LAB — MICROALBUMIN / CREATININE URINE RATIO
CREATININE, URINE: 176 mg/dL (ref 20–370)
MICROALB UR: 0.5 mg/dL
MICROALB/CREAT RATIO: 3 ug/mg{creat} (ref <30)

## 2015-09-25 LAB — INSULIN, RANDOM: INSULIN: 10.9 u[IU]/mL (ref 2.0–19.6)

## 2015-09-25 LAB — HEMOGLOBIN A1C
Hgb A1c MFr Bld: 4.8 % (ref <5.7)
Mean Plasma Glucose: 91 mg/dL

## 2015-09-25 LAB — TESTOSTERONE: TESTOSTERONE: 605 ng/dL (ref 250–827)

## 2015-09-25 LAB — VITAMIN D 25 HYDROXY (VIT D DEFICIENCY, FRACTURES): Vit D, 25-Hydroxy: 55 ng/mL (ref 30–100)

## 2015-10-12 ENCOUNTER — Other Ambulatory Visit: Payer: Self-pay | Admitting: *Deleted

## 2015-10-12 MED ORDER — BISOPROLOL FUMARATE 5 MG PO TABS: 5.0000 mg | ORAL_TABLET | Freq: Every day | ORAL | 2 refills | Status: DC

## 2015-12-28 ENCOUNTER — Other Ambulatory Visit: Payer: Self-pay | Admitting: Orthopedic Surgery

## 2016-01-11 DIAGNOSIS — S83249A Other tear of medial meniscus, current injury, unspecified knee, initial encounter: Secondary | ICD-10-CM

## 2016-01-11 HISTORY — DX: Other tear of medial meniscus, current injury, unspecified knee, initial encounter: S83.249A

## 2016-01-22 ENCOUNTER — Encounter (HOSPITAL_BASED_OUTPATIENT_CLINIC_OR_DEPARTMENT_OTHER): Payer: Self-pay | Admitting: *Deleted

## 2016-01-23 ENCOUNTER — Other Ambulatory Visit: Payer: Self-pay | Admitting: Internal Medicine

## 2016-01-28 ENCOUNTER — Ambulatory Visit (HOSPITAL_BASED_OUTPATIENT_CLINIC_OR_DEPARTMENT_OTHER): Payer: 59 | Admitting: Certified Registered"

## 2016-01-28 ENCOUNTER — Ambulatory Visit (HOSPITAL_BASED_OUTPATIENT_CLINIC_OR_DEPARTMENT_OTHER)
Admission: RE | Admit: 2016-01-28 | Discharge: 2016-01-28 | Disposition: A | Discharge status: Home / Self Care | Payer: 59 | Source: Ambulatory Visit | Attending: Orthopedic Surgery | Admitting: Orthopedic Surgery

## 2016-01-28 ENCOUNTER — Encounter (HOSPITAL_BASED_OUTPATIENT_CLINIC_OR_DEPARTMENT_OTHER): Payer: Self-pay | Admitting: *Deleted

## 2016-01-28 ENCOUNTER — Encounter (HOSPITAL_BASED_OUTPATIENT_CLINIC_OR_DEPARTMENT_OTHER)
Admission: RE | Disposition: A | Discharge status: Home / Self Care | Payer: Self-pay | Source: Ambulatory Visit | Attending: Orthopedic Surgery

## 2016-01-28 DIAGNOSIS — I10 Essential (primary) hypertension: Secondary | ICD-10-CM | POA: Diagnosis not present

## 2016-01-28 DIAGNOSIS — Z87891 Personal history of nicotine dependence: Secondary | ICD-10-CM | POA: Insufficient documentation

## 2016-01-28 DIAGNOSIS — M94261 Chondromalacia, right knee: Secondary | ICD-10-CM | POA: Insufficient documentation

## 2016-01-28 DIAGNOSIS — S83211A Bucket-handle tear of medial meniscus, current injury, right knee, initial encounter: Secondary | ICD-10-CM | POA: Diagnosis present

## 2016-01-28 DIAGNOSIS — X500XXA Overexertion from strenuous movement or load, initial encounter: Secondary | ICD-10-CM | POA: Insufficient documentation

## 2016-01-28 DIAGNOSIS — K219 Gastro-esophageal reflux disease without esophagitis: Secondary | ICD-10-CM | POA: Insufficient documentation

## 2016-01-28 HISTORY — DX: Gastro-esophageal reflux disease without esophagitis: K21.9

## 2016-01-28 HISTORY — DX: Personal history of other diseases of the digestive system: Z87.19

## 2016-01-28 HISTORY — PX: KNEE ARTHROSCOPY WITH MEDIAL MENISECTOMY: SHX5651

## 2016-01-28 HISTORY — DX: Other tear of medial meniscus, current injury, unspecified knee, initial encounter: S83.249A

## 2016-01-28 HISTORY — DX: Dental restoration status: Z98.811

## 2016-01-28 SURGERY — ARTHROSCOPY, KNEE, WITH MEDIAL MENISCECTOMY
Anesthesia: General | Site: Knee | Laterality: Right

## 2016-01-28 MED ORDER — ONDANSETRON HCL 4 MG/2ML IJ SOLN
INTRAMUSCULAR | Status: DC | PRN
  Administered 2016-01-28: 4 mg via INTRAVENOUS

## 2016-01-28 MED ORDER — GLYCOPYRROLATE 0.2 MG/ML IJ SOLN: 0.2000 mg | Freq: Once | INTRAMUSCULAR | Status: DC | PRN

## 2016-01-28 MED ORDER — POVIDONE-IODINE 7.5 % EX SOLN: Freq: Once | CUTANEOUS | Status: DC

## 2016-01-28 MED ORDER — SODIUM CHLORIDE 0.9 % IR SOLN
Status: DC | PRN
  Administered 2016-01-28: 3000 mL

## 2016-01-28 MED ORDER — CELECOXIB 200 MG PO CAPS
ORAL_CAPSULE | ORAL | Status: AC
  Filled 2016-01-28: qty 1

## 2016-01-28 MED ORDER — LIDOCAINE HCL (CARDIAC) 20 MG/ML IV SOLN
INTRAVENOUS | Status: DC | PRN
  Administered 2016-01-28: 60 mg via INTRAVENOUS

## 2016-01-28 MED ORDER — ACETAMINOPHEN 500 MG PO TABS
1000.0000 mg | ORAL_TABLET | Freq: Once | ORAL | Status: AC
  Administered 2016-01-28: 1000 mg via ORAL

## 2016-01-28 MED ORDER — LACTATED RINGERS IV SOLN: 500.0000 mL | INTRAVENOUS | Status: DC

## 2016-01-28 MED ORDER — CELECOXIB 200 MG PO CAPS
200.0000 mg | ORAL_CAPSULE | Freq: Once | ORAL | Status: AC | PRN
  Administered 2016-01-28: 200 mg via ORAL

## 2016-01-28 MED ORDER — HYDROCODONE-ACETAMINOPHEN 5-325 MG PO TABS: 1.0000 | ORAL_TABLET | Freq: Four times a day (QID) | ORAL | 0 refills | Status: DC | PRN

## 2016-01-28 MED ORDER — DEXAMETHASONE SODIUM PHOSPHATE 10 MG/ML IJ SOLN
INTRAMUSCULAR | Status: DC | PRN
  Administered 2016-01-28: 10 mg via INTRAVENOUS

## 2016-01-28 MED ORDER — CEFAZOLIN SODIUM-DEXTROSE 2-4 GM/100ML-% IV SOLN
INTRAVENOUS | Status: AC
  Filled 2016-01-28: qty 100

## 2016-01-28 MED ORDER — DOCUSATE SODIUM 100 MG PO CAPS: 100.0000 mg | ORAL_CAPSULE | Freq: Three times a day (TID) | ORAL | 0 refills | Status: DC | PRN

## 2016-01-28 MED ORDER — SCOPOLAMINE 1 MG/3DAYS TD PT72: 1.0000 | MEDICATED_PATCH | Freq: Once | TRANSDERMAL | Status: DC | PRN

## 2016-01-28 MED ORDER — EPHEDRINE 5 MG/ML INJ
INTRAVENOUS | Status: AC
  Filled 2016-01-28: qty 10

## 2016-01-28 MED ORDER — ONDANSETRON HCL 4 MG/2ML IJ SOLN
INTRAMUSCULAR | Status: AC
  Filled 2016-01-28: qty 2

## 2016-01-28 MED ORDER — FENTANYL CITRATE (PF) 100 MCG/2ML IJ SOLN
25.0000 ug | INTRAMUSCULAR | Status: DC | PRN
  Administered 2016-01-28: 100 ug via INTRAVENOUS

## 2016-01-28 MED ORDER — ROPIVACAINE HCL 5 MG/ML IJ SOLN
INTRAMUSCULAR | Status: DC | PRN
  Administered 2016-01-28: 20 mL via EPIDURAL

## 2016-01-28 MED ORDER — HYDROMORPHONE HCL 1 MG/ML IJ SOLN
0.2500 mg | INTRAMUSCULAR | Status: DC | PRN
  Administered 2016-01-28: 0.5 mg via INTRAVENOUS

## 2016-01-28 MED ORDER — MIDAZOLAM HCL 2 MG/2ML IJ SOLN: 1.0000 mg | INTRAMUSCULAR | Status: DC | PRN

## 2016-01-28 MED ORDER — FENTANYL CITRATE (PF) 100 MCG/2ML IJ SOLN
INTRAMUSCULAR | Status: AC
  Filled 2016-01-28: qty 2

## 2016-01-28 MED ORDER — GABAPENTIN 300 MG PO CAPS
300.0000 mg | ORAL_CAPSULE | Freq: Once | ORAL | Status: AC | PRN
  Administered 2016-01-28: 300 mg via ORAL

## 2016-01-28 MED ORDER — MIDAZOLAM HCL 2 MG/2ML IJ SOLN
INTRAMUSCULAR | Status: AC
  Filled 2016-01-28: qty 2

## 2016-01-28 MED ORDER — MIDAZOLAM HCL 2 MG/2ML IJ SOLN
1.0000 mg | INTRAMUSCULAR | Status: DC | PRN
  Administered 2016-01-28: 2 mg via INTRAVENOUS

## 2016-01-28 MED ORDER — FENTANYL CITRATE (PF) 100 MCG/2ML IJ SOLN: 50.0000 ug | INTRAMUSCULAR | Status: DC | PRN

## 2016-01-28 MED ORDER — LACTATED RINGERS IV SOLN: INTRAVENOUS | Status: DC

## 2016-01-28 MED ORDER — SCOPOLAMINE 1 MG/3DAYS TD PT72
1.0000 | MEDICATED_PATCH | Freq: Once | TRANSDERMAL | Status: DC | PRN
Start: 2016-01-28 — End: 2016-01-28

## 2016-01-28 MED ORDER — PROMETHAZINE HCL 25 MG/ML IJ SOLN: 6.2500 mg | INTRAMUSCULAR | Status: DC | PRN

## 2016-01-28 MED ORDER — ACETAMINOPHEN 500 MG PO TABS
ORAL_TABLET | ORAL | Status: AC
  Filled 2016-01-28: qty 2

## 2016-01-28 MED ORDER — HYDROMORPHONE HCL 1 MG/ML IJ SOLN
INTRAMUSCULAR | Status: AC
  Filled 2016-01-28: qty 1

## 2016-01-28 MED ORDER — PROPOFOL 10 MG/ML IV BOLUS
INTRAVENOUS | Status: AC
Start: 2016-01-28 — End: 2016-01-28
  Filled 2016-01-28: qty 20

## 2016-01-28 MED ORDER — DEXAMETHASONE SODIUM PHOSPHATE 10 MG/ML IJ SOLN
INTRAMUSCULAR | Status: AC
  Filled 2016-01-28: qty 1

## 2016-01-28 MED ORDER — PROPOFOL 10 MG/ML IV BOLUS
INTRAVENOUS | Status: DC | PRN
  Administered 2016-01-28: 200 mg via INTRAVENOUS

## 2016-01-28 MED ORDER — ACETAMINOPHEN 160 MG/5ML PO SOLN: 960.0000 mg | Freq: Once | ORAL | Status: AC

## 2016-01-28 MED ORDER — GABAPENTIN 300 MG PO CAPS
ORAL_CAPSULE | ORAL | Status: AC
  Filled 2016-01-28: qty 1

## 2016-01-28 MED ORDER — LACTATED RINGERS IV SOLN
INTRAVENOUS | Status: DC
  Administered 2016-01-28 (×2): via INTRAVENOUS

## 2016-01-28 MED ORDER — CEFAZOLIN SODIUM-DEXTROSE 2-4 GM/100ML-% IV SOLN
2.0000 g | INTRAVENOUS | Status: AC
  Administered 2016-01-28: 2 g via INTRAVENOUS

## 2016-01-28 MED ORDER — LIDOCAINE 2% (20 MG/ML) 5 ML SYRINGE
INTRAMUSCULAR | Status: AC
  Filled 2016-01-28: qty 5

## 2016-01-28 SURGICAL SUPPLY — 30 items
BANDAGE ACE 6X5 VEL STRL LF (GAUZE/BANDAGES/DRESSINGS) ×3 IMPLANT
BLADE 4.2CUDA (BLADE) ×3 IMPLANT
BLADE CUTTER GATOR 3.5 (BLADE) IMPLANT
BLADE CUTTER MENIS 5.5 (BLADE) IMPLANT
CHLORAPREP W/TINT 26ML (MISCELLANEOUS) ×3 IMPLANT
DRAPE ARTHROSCOPY W/POUCH 114 (DRAPES) ×3 IMPLANT
DRAPE U-SHAPE 47X51 STRL (DRAPES) ×3 IMPLANT
DRSG EMULSION OIL 3X3 NADH (GAUZE/BANDAGES/DRESSINGS) ×3 IMPLANT
GAUZE SPONGE 4X4 12PLY STRL (GAUZE/BANDAGES/DRESSINGS) ×3 IMPLANT
GLOVE BIO SURGEON STRL SZ7 (GLOVE) ×3 IMPLANT
GLOVE BIO SURGEON STRL SZ7.5 (GLOVE) ×3 IMPLANT
GLOVE BIOGEL PI IND STRL 7.0 (GLOVE) ×1 IMPLANT
GLOVE BIOGEL PI IND STRL 8 (GLOVE) ×1 IMPLANT
GLOVE BIOGEL PI INDICATOR 7.0 (GLOVE) ×2
GLOVE BIOGEL PI INDICATOR 8 (GLOVE) ×2
GOWN STRL REUS W/ TWL LRG LVL3 (GOWN DISPOSABLE) ×2 IMPLANT
GOWN STRL REUS W/TWL LRG LVL3 (GOWN DISPOSABLE) ×4
IV NS IRRIG 3000ML ARTHROMATIC (IV SOLUTION) ×6 IMPLANT
KNEE WRAP E Z 3 GEL PACK (MISCELLANEOUS) ×3 IMPLANT
MANIFOLD NEPTUNE II (INSTRUMENTS) ×3 IMPLANT
PACK ARTHROSCOPY DSU (CUSTOM PROCEDURE TRAY) ×3 IMPLANT
PACK BASIN DAY SURGERY FS (CUSTOM PROCEDURE TRAY) ×3 IMPLANT
PROBE BIPOLAR ATHRO 135MM 90D (MISCELLANEOUS) IMPLANT
SUT ETHILON 4 0 PS 2 18 (SUTURE) ×3 IMPLANT
SUT TIGER TAPE 7 IN WHITE (SUTURE) IMPLANT
TAPE FIBER 2MM 7IN #2 BLUE (SUTURE) IMPLANT
TOWEL OR 17X24 6PK STRL BLUE (TOWEL DISPOSABLE) ×3 IMPLANT
TOWEL OR NON WOVEN STRL DISP B (DISPOSABLE) ×3 IMPLANT
TUBING ARTHROSCOPY IRRIG 16FT (MISCELLANEOUS) ×3 IMPLANT
WATER STERILE IRR 1000ML POUR (IV SOLUTION) ×3 IMPLANT

## 2016-01-28 NOTE — Anesthesia Preprocedure Evaluation (Addendum)
Anesthesia Evaluation  Patient identified by MRN, date of birth, ID band Patient awake    Reviewed: Allergy & Precautions, NPO status , Patient's Chart, lab work & pertinent test results, reviewed documented beta blocker date and time   History of Anesthesia Complications Negative for: history of anesthetic complications  Airway Mallampati: II  TM Distance: >3 FB Neck ROM: Full    Dental  (+) Teeth Intact, Dental Advisory Given   Pulmonary former smoker,    Pulmonary exam normal breath sounds clear to auscultation       Cardiovascular hypertension, Pt. on medications and Pt. on home beta blockers Normal cardiovascular exam Rhythm:Regular Rate:Normal     Neuro/Psych negative neurological ROS  negative psych ROS   GI/Hepatic Neg liver ROS, GERD  Medicated and Controlled,  Endo/Other  negative endocrine ROS  Renal/GU negative Renal ROS     Musculoskeletal negative musculoskeletal ROS (+)   Abdominal   Peds  Hematology negative hematology ROS (+)   Anesthesia Other Findings Day of surgery medications reviewed with the patient.  Reproductive/Obstetrics                            Anesthesia Physical Anesthesia Plan  ASA: II  Anesthesia Plan: General   Post-op Pain Management:    Induction: Intravenous  Airway Management Planned: LMA  Additional Equipment:   Intra-op Plan:   Post-operative Plan: Extubation in OR  Informed Consent: I have reviewed the patients History and Physical, chart, labs and discussed the procedure including the risks, benefits and alternatives for the proposed anesthesia with the patient or authorized representative who has indicated his/her understanding and acceptance.   Dental advisory given  Plan Discussed with: CRNA  Anesthesia Plan Comments: (Risks/benefits of general anesthesia discussed with patient including risk of damage to teeth, lips, gum, and  tongue, nausea/vomiting, allergic reactions to medications, and the possibility of heart attack, stroke and death.  All patient questions answered.  Patient wishes to proceed.)        Anesthesia Quick Evaluation

## 2016-01-28 NOTE — Op Note (Signed)
Procedure(s): KNEE ARTHROSCOPY WITH PARTIAL MEDIAL MENISCECTOMY Procedure Note  Shane AlpersJames Nguyen male 35 y.o. 01/28/2016  Procedure(s) and Anesthesia Type:    * R KNEE ARTHROSCOPY WITH PARTIAL MEDIAL MENISCECTOMY  #2 right knee arthroscopic abrasion arthroplasty trochlear  Surgeon(s) and Role:    * Jones BroomJustin Loreda Silverio, MD - Primary     Surgeon: Mable ParisHANDLER,Kylia Grajales WILLIAM   Assistants: Damita Lackanielle Lalibert PA-C (Danielle was present and scrubbed throughout the procedure and was essential in positioning, assisting with the camera and instrumentation,, and closure)  Anesthesia: General endotracheal anesthesia     Procedure Detail  KNEE ARTHROSCOPY WITH PARTIAL MEDIAL MENISCECTOMY, abrasion arthroplasty  Estimated Blood Loss: Min         Drains: none  Blood Given: none         Specimens: none        Complications:  * No complications entered in OR log *         Disposition: PACU - hemodynamically stable.         Condition: stable    Procedure:   INDICATIONS FOR SURGERY: The patient is 35 y.o. male who had a history of right knee injury a few months ago with hyperflexion. He has had continued discomfort and intermittent swelling and mechanical symptoms. He was found on MRI to have a posterior horn medial meniscus tear. He has a history of anterior cruciate ligament reconstruction which was noted to be intact.  OPERATIVE FINDINGS: Examination under anesthesia: No stiffness or instability. Negative Lachman's exam with a good endpoint. Negative pivot shift.   DESCRIPTION OF PROCEDURE: The patient was identified in preoperative  holding area where I personally marked the operative site after  verifying site, side, and procedure with the patient.  The patient was taken back to the operating room where general anesthesia was induced without complication and was placed in the supine position with the operative leg placed in a padded leg holder. The foot of the bed was dropped. The  opposite extremity was well padded.  The right lower extremity was then prepped and  draped in a standard sterile fashion. The appropriate time-out  procedure was carried out. The patient did receive IV antibiotics  within 30 minutes of incision.   A small stab incision was made in the anterolateral portal position. The arthroscope was introduced in the joint. A medial portal was then established under direct visualization just above the anterior horn of the medial meniscus. Diagnostic arthroscopy was then carried out.  Attention was first turned to the medial compartment where the knee was placed with a valgus stress in slight flexion. The posterior horn was probed and noted to have a displaceable bucket handle type tear involving the central 1/3-1/2 of the remaining peripheral meniscus. This was easily displaceable. A large biter was used to release the medial attachment coming into the body and then the shaver was used to resect this piece back to the meniscal root. The biter and shaver were then used to create a smooth transition to the normal meniscal body. Anteriorly the meniscus was noted to be intact. The notch was then examined and the anterior cruciate ligament graft was noted to be intact. The knee was placed in a figure 4 position and the lateral compartment was examined. Lateral meniscus and cartilage was intact. 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 Collene MaresShaver was used to debride this. In order to get back to stable  shoulders and resect the unstable chondral flaps, a ring curet was used. This was then used at the areas of grade 4 exposed bone to scrape and promote bleeding and effort to allow fibrocartilage formation in this area. The patella was noted to be tracking appropriately.  The arthroscopic equipment was removed from the joint and the portals were closed with 3-0 nylon in an  interrupted fashion. The knee was infiltrated with 20 mL half percent ropivacaine.  Sterile dressings were then applied including Xeroform 4 x 4's ABDs an ACE bandage.  The patient was then allowed to awaken from general anesthesia, transferred to the stretcher and taken to the recovery room in stable condition.   POSTOPERATIVE PLAN: The patient will be discharged home today and will followup in one week for suture removal and wound check.  We will get him into therapy right away.

## 2016-01-28 NOTE — Discharge Instructions (Signed)
Discharge Instructions after Knee Arthroscopy   You will have a light dressing on your knee.  Leave the dressing in place until the third day after your surgery and then remove it and place a band-aid over the stitches.  After the bandage has been removed you may shower, but do not soak the incision. You may begin gentle motion of your leg immediately after surgery. Pump your foot up and down 20 times per hour, every hour you are awake.  Apply ice to the knee 3 times per day for 30 minutes for the first 1 week until your knee is feeling comfortable again. Do not use heat.  You may begin straight leg raising exercises (if you have a brace with it on). While lying down, pull your foot all the way up, tighten your quadriceps muscle and lift your heel off of the ground. Hold this position for 2 seconds, and then let the leg back down. Repeat the exercise 10 times, at least 3 times a day.  Pain medicine has been prescribed for you.  Use your medicine as needed over the first 48 hours, and then you can begin to taper your use. You may take Extra Strength Tylenol or Tylenol only in place of the pain pills.  Take one 81mg  aspirin daily for 3 weeks post-operatively unless it upsets your stomach.   Please call [phone] during normal business hours or [phone] after hours for any problems. Including the following:  - excessive redness of the incisions - drainage for more than 4 days - fever of more than 101.5 F  *Please note that pain medications will not be refilled after hours or on weekends.    Post Anesthesia Home Care Instructions  Activity: Get plenty of rest for the remainder of the day. A responsible adult should stay with you for 24 hours following the procedure.  For the next 24 hours, DO NOT: -Drive a car -Advertising copywriterperate machinery -Drink alcoholic beverages -Take any medication unless instructed by your physician -Make any legal decisions or sign important papers.  Meals: Start  with liquid foods such as gelatin or soup. Progress to regular foods as tolerated. Avoid greasy, spicy, heavy foods. If nausea and/or vomiting occur, drink only clear liquids until the nausea and/or vomiting subsides. Call your physician if vomiting continues.  Special Instructions/Symptoms: Your throat may feel dry or sore from the anesthesia or the breathing tube placed in your throat during surgery. If this causes discomfort, gargle with warm salt water. The discomfort should disappear within 24 hours.  If you had a scopolamine patch placed behind your ear for the management of post- operative nausea and/or vomiting:  1. The medication in the patch is effective for 72 hours, after which it should be removed.  Wrap patch in a tissue and discard in the trash. Wash hands thoroughly with soap and water. 2. You may remove the patch earlier than 72 hours if you experience unpleasant side effects which may include dry mouth, dizziness or visual disturbances. 3. Avoid touching the patch. Wash your hands with soap and water after contact with the patch.   Call your surgeon if you experience:   1.  Fever over 101.0. 2.  Inability to urinate. 3.  Nausea and/or vomiting. 4.  Extreme swelling or bruising at the surgical site. 5.  Continued bleeding from the incision. 6.  Increased pain, redness or drainage from the incision. 7.  Problems related to your pain medication. 8.  Any problems and/or concerns

## 2016-01-28 NOTE — H&P (Signed)
Shane AlpersJames Lesure is an 35 y.o. male.   Chief Complaint: Right knee pain HPI: Right knee pain and mechanical symptoms since a fall with a hyperflexion injury. MRI demonstrated posterior horn medial meniscus tear. He has failed conservative measures and continues to have pain and mechanical symptoms.  Past Medical History:  Diagnosis Date  . Dental crowns present   . GERD (gastroesophageal reflux disease)   . History of pancreatitis 08/2015  . Hypertension    states under control with med., has been on med. x 4 yr.  . Medial meniscus tear 01/2016   right knee    Past Surgical History:  Procedure Laterality Date  . ANTERIOR CRUCIATE LIGAMENT REPAIR     bilateral  . SHOULDER ARTHROSCOPY W/ ROTATOR CUFF REPAIR Right 02/2015    Family History  Problem Relation Age of Onset  . Hypertension Father   . Hyperlipidemia Father    Social History:  reports that he quit smoking about 10 years ago. He smoked 0.00 packs per day for 0.00 years. He has never used smokeless tobacco. He reports that he does not drink alcohol or use drugs.  Allergies: No Known Allergies  Medications Prior to Admission  Medication Sig Dispense Refill  . ALPRAZolam (XANAX) 1 MG tablet Take 0.5 mg by mouth at bedtime as needed for anxiety.    . bisoprolol (ZEBETA) 5 MG tablet TAKE ONE TABLET BY MOUTH ONCE DAILY 30 tablet 2  . Melatonin 10 MG TABS Take 1 tablet by mouth at bedtime.    . multivitamin (ONE-A-DAY MEN'S) TABS tablet Take 1 tablet by mouth daily.    . psyllium (METAMUCIL) 58.6 % packet Take 1 packet by mouth daily.    . ranitidine (ZANTAC) 150 MG capsule Take 150 mg by mouth 2 (two) times daily. Reported on 08/21/2015      No results found for this or any previous visit (from the past 48 hour(s)). No results found.  Review of Systems  All other systems reviewed and are negative.   Blood pressure 121/84, pulse (!) 52, temperature 97.7 F (36.5 C), temperature source Oral, resp. rate 20, height 6' (1.829  m), weight 92.5 kg (204 lb), SpO2 100 %. Physical Exam  Constitutional: He is oriented to person, place, and time. He appears well-developed and well-nourished.  HENT:  Head: Atraumatic.  Eyes: EOM are normal.  Cardiovascular: Intact distal pulses.   Respiratory: Effort normal.  Musculoskeletal:  Right knee medial joint line tenderness and pain with hyperflexion medially.  Neurological: He is alert and oriented to person, place, and time.  Skin: Skin is warm and dry.  Psychiatric: He has a normal mood and affect.     Assessment/Plan Right knee posterior horn medial meniscus tear Plan right knee arthroscopic partial medial meniscectomy Risks / benefits of surgery discussed Consent on chart  NPO for OR Preop antibiotics   Mable ParisHANDLER,Teron Blais WILLIAM, MD 01/28/2016, 1:30 PM

## 2016-01-28 NOTE — Anesthesia Procedure Notes (Signed)
Procedure Name: LMA Insertion Date/Time: 01/28/2016 1:44 PM Performed by: Constantine Ruddick D Pre-anesthesia Checklist: Patient identified, Emergency Drugs available, Suction available and Patient being monitored Patient Re-evaluated:Patient Re-evaluated prior to inductionOxygen Delivery Method: Circle system utilized Preoxygenation: Pre-oxygenation with 100% oxygen Intubation Type: IV induction Ventilation: Mask ventilation without difficulty LMA: LMA inserted LMA Size: 4.0 Number of attempts: 1 Airway Equipment and Method: Bite block Placement Confirmation: positive ETCO2 Tube secured with: Tape Dental Injury: Teeth and Oropharynx as per pre-operative assessment

## 2016-01-28 NOTE — Transfer of Care (Signed)
Immediate Anesthesia Transfer of Care Note  Patient: Shane Nguyen  Procedure(s) Performed: Procedure(s) with comments: KNEE ARTHROSCOPY WITH PARTIAL MEDIAL MENISCECTOMY (Right) - KNEE ARTHROSCOPY WITH PARTIAL MEDIAL MENISECTOMY  Patient Location: PACU  Anesthesia Type:General  Level of Consciousness: awake, alert , oriented and patient cooperative  Airway & Oxygen Therapy: Patient Spontanous Breathing and Patient connected to face mask oxygen  Post-op Assessment: Report given to RN and Post -op Vital signs reviewed and stable  Post vital signs: Reviewed and stable  Last Vitals:  Vitals:   01/28/16 1200  BP: 121/84  Pulse: (!) 52  Resp: 20  Temp: 36.5 C    Last Pain:  Vitals:   01/28/16 1200  TempSrc: Oral         Complications: No apparent anesthesia complications

## 2016-01-29 ENCOUNTER — Encounter (HOSPITAL_BASED_OUTPATIENT_CLINIC_OR_DEPARTMENT_OTHER): Payer: Self-pay | Admitting: Orthopedic Surgery

## 2016-01-29 NOTE — Anesthesia Postprocedure Evaluation (Signed)
Anesthesia Post Note  Patient: Shane AlpersJames Enamorado  Procedure(s) Performed: Procedure(s) (LRB): KNEE ARTHROSCOPY WITH PARTIAL MEDIAL MENISCECTOMY (Right)  Patient location during evaluation: PACU Anesthesia Type: General Level of consciousness: awake and alert Pain management: pain level controlled Vital Signs Assessment: post-procedure vital signs reviewed and stable Respiratory status: spontaneous breathing, nonlabored ventilation, respiratory function stable and patient connected to nasal cannula oxygen Cardiovascular status: blood pressure returned to baseline and stable Postop Assessment: no signs of nausea or vomiting Anesthetic complications: no       Last Vitals:  Vitals:   01/28/16 1500 01/28/16 1529  BP: 128/83 137/84  Pulse: (!) 59 61  Resp: 13 18  Temp:  36.4 C    Last Pain:  Vitals:   01/28/16 1529  TempSrc: Oral  PainSc: 0-No pain                 Cecile HearingStephen Edward Jaeven Wanzer

## 2016-03-11 ENCOUNTER — Other Ambulatory Visit: Payer: Self-pay | Admitting: Surgery

## 2016-03-24 ENCOUNTER — Ambulatory Visit (INDEPENDENT_AMBULATORY_CARE_PROVIDER_SITE_OTHER): Payer: 59 | Admitting: Internal Medicine

## 2016-03-24 ENCOUNTER — Encounter: Payer: Self-pay | Admitting: Internal Medicine

## 2016-03-24 VITALS — BP 126/64 | HR 60 | Temp 98.2°F | Resp 18 | Ht 73.0 in | Wt 215.0 lb

## 2016-03-24 DIAGNOSIS — E782 Mixed hyperlipidemia: Secondary | ICD-10-CM | POA: Diagnosis not present

## 2016-03-24 DIAGNOSIS — R7309 Other abnormal glucose: Secondary | ICD-10-CM | POA: Diagnosis not present

## 2016-03-24 DIAGNOSIS — Z79899 Other long term (current) drug therapy: Secondary | ICD-10-CM

## 2016-03-24 DIAGNOSIS — E559 Vitamin D deficiency, unspecified: Secondary | ICD-10-CM

## 2016-03-24 DIAGNOSIS — I10 Essential (primary) hypertension: Secondary | ICD-10-CM

## 2016-03-24 DIAGNOSIS — Z8719 Personal history of other diseases of the digestive system: Secondary | ICD-10-CM | POA: Diagnosis not present

## 2016-03-24 MED ORDER — BISOPROLOL FUMARATE 5 MG PO TABS: 5.0000 mg | ORAL_TABLET | Freq: Every day | ORAL | 2 refills | Status: DC

## 2016-03-24 NOTE — Progress Notes (Signed)
Assessment and Plan:  Hypertension:  -cont bisoprolol -Continue medication,  -monitor blood pressure at home.  -Continue DASH diet.   -Reminder to go to the ER if any CP, SOB, nausea, dizziness, severe HA, changes vision/speech, left arm numbness and tingling, and jaw pain.  Cholesterol: -Continue diet and exercise.   Pre-diabetes: -Continue diet and exercise.   Vitamin D Def: -continue medications.   History of pancreatitis -no further bouts -doubt 6.25 mg of HCTZ was enough to cause pancreatitis.   Continue diet and meds as discussed. Further disposition pending results of labs.  HPI [age] y.o. male  presents for 3 month follow up with hypertension, hyperlipidemia, prediabetes and vitamin D.   His blood pressure has been controlled at home, today their BP is BP: 126/64.   He does workout. He denies chest pain, shortness of breath, dizziness.  He reports that he is not longer taking the diuretic portion of the BP medication.  He thinks that that was the cause of the pancreatitis.     He is not on cholesterol medication and denies myalgias. His cholesterol is at goal. The cholesterol last visit was:   Lab Results  Component Value Date   CHOL 141 09/24/2015   HDL 41 09/24/2015   LDLCALC 85 09/24/2015   TRIG 77 09/24/2015   CHOLHDL 3.4 09/24/2015     He has been working on diet and exercise for prediabetes, and denies foot ulcerations, hyperglycemia, hypoglycemia , increased appetite, nausea, paresthesia of the feet, polydipsia, polyuria, visual disturbances, vomiting and weight loss. Last A1C in the office was:  Lab Results  Component Value Date   HGBA1C 4.8 09/24/2015    Patient is on Vitamin D supplement.  Lab Results  Component Value Date   VD25OH 55 09/24/2015      Current Medications:  Current Outpatient Prescriptions on File Prior to Visit  Medication Sig Dispense Refill  . ALPRAZolam (XANAX) 1 MG tablet Take 0.5 mg by mouth at bedtime as needed for anxiety.     . bisoprolol (ZEBETA) 5 MG tablet TAKE ONE TABLET BY MOUTH ONCE DAILY 30 tablet 2  . Melatonin 10 MG TABS Take 1 tablet by mouth at bedtime.    . multivitamin (ONE-A-DAY MEN'S) TABS tablet Take 1 tablet by mouth daily.    . psyllium (METAMUCIL) 58.6 % packet Take 1 packet by mouth daily.    . ranitidine (ZANTAC) 150 MG capsule Take 150 mg by mouth 2 (two) times daily. Reported on 08/21/2015     No current facility-administered medications on file prior to visit.     Medical History:  Past Medical History:  Diagnosis Date  . Dental crowns present   . GERD (gastroesophageal reflux disease)   . History of pancreatitis 08/2015  . Hypertension    states under control with med., has been on med. x 4 yr.  . Medial meniscus tear 01/2016   right knee    Allergies: No Known Allergies   Review of Systems:  Review of Systems  Constitutional: Negative for chills, fever and malaise/fatigue.  HENT: Negative for congestion, ear pain and sore throat.   Eyes: Negative.   Respiratory: Negative for cough, shortness of breath and wheezing.   Cardiovascular: Negative for chest pain, palpitations and leg swelling.  Gastrointestinal: Negative for abdominal pain, blood in stool, constipation, diarrhea, heartburn and melena.  Genitourinary: Negative.   Skin: Negative.   Neurological: Negative for dizziness, sensory change, loss of consciousness and headaches.  Psychiatric/Behavioral: Negative for depression. The  patient is not nervous/anxious and does not have insomnia.     Family history- Review and unchanged  Social history- Review and unchanged  Physical Exam: BP 126/64   Pulse 60   Temp 98.2 F (36.8 C) (Temporal)   Resp 18   Ht 6\' 1"  (1.854 m)   Wt 215 lb (97.5 kg)   BMI 28.37 kg/m  Wt Readings from Last 3 Encounters:  03/24/16 215 lb (97.5 kg)  01/28/16 204 lb (92.5 kg)  09/24/15 208 lb 6.4 oz (94.5 kg)    General Appearance: Well nourished well developed, in no apparent  distress. Eyes: PERRLA, EOMs, conjunctiva no swelling or erythema ENT/Mouth: Ear canals normal without obstruction, swelling, erythma, discharge.  TMs normal bilaterally.  Oropharynx moist, clear, without exudate, or postoropharyngeal swelling. Neck: Supple, thyroid normal,no cervical adenopathy  Respiratory: Respiratory effort normal, Breath sounds clear A&P without rhonchi, wheeze, or rale.  No retractions, no accessory usage. Cardio: RRR with no MRGs. Brisk peripheral pulses without edema.  Abdomen: Soft, + BS,  Non tender, no guarding, rebound, hernias, masses. Musculoskeletal: Full ROM, 5/5 strength, Normal gait Skin: Warm, dry without rashes, lesions, ecchymosis.  Neuro: Awake and oriented X 3, Cranial nerves intact. Normal muscle tone, no cerebellar symptoms. Psych: Normal affect, Insight and Judgment appropriate.    Terri Piedra, PA-C 9:04 AM [facility]

## 2016-03-31 ENCOUNTER — Ambulatory Visit: Payer: Self-pay | Admitting: Internal Medicine

## 2016-10-22 ENCOUNTER — Ambulatory Visit (INDEPENDENT_AMBULATORY_CARE_PROVIDER_SITE_OTHER): Payer: 59 | Admitting: Internal Medicine

## 2016-10-22 ENCOUNTER — Encounter: Payer: Self-pay | Admitting: Internal Medicine

## 2016-10-22 VITALS — BP 112/70 | HR 48 | Temp 97.5°F | Resp 18 | Ht 73.5 in | Wt 219.0 lb

## 2016-10-22 DIAGNOSIS — I10 Essential (primary) hypertension: Secondary | ICD-10-CM | POA: Diagnosis not present

## 2016-10-22 DIAGNOSIS — Z136 Encounter for screening for cardiovascular disorders: Secondary | ICD-10-CM | POA: Diagnosis not present

## 2016-10-22 DIAGNOSIS — Z1212 Encounter for screening for malignant neoplasm of rectum: Secondary | ICD-10-CM

## 2016-10-22 DIAGNOSIS — Z111 Encounter for screening for respiratory tuberculosis: Secondary | ICD-10-CM

## 2016-10-22 DIAGNOSIS — E559 Vitamin D deficiency, unspecified: Secondary | ICD-10-CM

## 2016-10-22 DIAGNOSIS — R7309 Other abnormal glucose: Secondary | ICD-10-CM

## 2016-10-22 DIAGNOSIS — R5383 Other fatigue: Secondary | ICD-10-CM

## 2016-10-22 DIAGNOSIS — K219 Gastro-esophageal reflux disease without esophagitis: Secondary | ICD-10-CM

## 2016-10-22 DIAGNOSIS — Z79899 Other long term (current) drug therapy: Secondary | ICD-10-CM

## 2016-10-22 DIAGNOSIS — Z0000 Encounter for general adult medical examination without abnormal findings: Secondary | ICD-10-CM | POA: Diagnosis not present

## 2016-10-22 DIAGNOSIS — Z0001 Encounter for general adult medical examination with abnormal findings: Secondary | ICD-10-CM

## 2016-10-22 DIAGNOSIS — E782 Mixed hyperlipidemia: Secondary | ICD-10-CM

## 2016-10-22 DIAGNOSIS — Z1211 Encounter for screening for malignant neoplasm of colon: Secondary | ICD-10-CM

## 2016-10-22 NOTE — Patient Instructions (Signed)

## 2016-10-22 NOTE — Progress Notes (Signed)
[facility]   Lucky Cowboy, M.D.      Dyanne Carrel. Steffanie Dunn, P.A.-C [facility]                [address]                [place], [place]. [address] Telephone [phone] Telefax [phone] Annual  Screening/Preventative Visit  & Comprehensive Evaluation & Examination     This very nice 36 y.o. MWM presents for a Screening/Preventative Visit & comprehensive evaluation and management of multiple medical co-morbidities.  Patient has been followed for HTN, Prediabetes, Hyperlipidemia and Vitamin D Deficiency. Patient also has GERS+D controlled with Ranitidine.    HTN predates circa 2011. Patient's BP has been controlled at home.  Today's BP is at goal - 112/70. Patient denies any cardiac symptoms as chest pain, palpitations, shortness of breath, dizziness or ankle swelling.     Patient's hyperlipidemia is controlled with diet. Last lipids were at goal: Lab Results  Component Value Date   CHOL 141 09/24/2015   HDL 41 09/24/2015   LDLCALC 85 09/24/2015   TRIG 77 09/24/2015   CHOLHDL 3.4 09/24/2015      Patient has hx/o Morbid Obesity(wt. 280 # in 2004 and down now to current wt. 219 #) and is expectantly screened for  prediabetes and patient denies reactive hypoglycemic symptoms, visual blurring, diabetic polys or paresthesias. Last A1c was still at goal: Lab Results  Component Value Date   HGBA1C 4.8 09/24/2015       Finally, patient has history of Vitamin D Deficiency ("49" on Tx in 2011)  and last vitamin D was near goal (70-100): Lab Results  Component Value Date   VD25OH 55 09/24/2015   Current Outpatient Prescriptions on File Prior to Visit  Medication Sig  . ALPRAZolam (XANAX) 1 MG tablet Take 0.5 mg by mouth at bedtime as needed for anxiety.  . bisoprolol (ZEBETA) 5 MG tablet Take 1 tablet (5 mg total) by mouth daily.  . multivitamin (ONE-A-DAY MEN'S) TABS tablet Take 1 tablet by mouth daily.  .  psyllium (METAMUCIL) 58.6 % packet Take 1 packet by mouth daily.  . ranitidine (ZANTAC) 150 MG capsule Take 150 mg by mouth 2 (two) times daily. Reported on 08/21/2015   No current facility-administered medications on file prior to visit.    No Known Allergies Past Medical History:  Diagnosis Date  . Dental crowns present   . GERD (gastroesophageal reflux disease)   . History of pancreatitis 08/2015  . Hypertension    states under control with med., has been on med. x 4 yr.  . Medial meniscus tear 01/2016   right knee   Health Maintenance  Topic Date Due  . TETANUS/TDAP  03/11/2025  . HIV Screening  Completed  . INFLUENZA VACCINE  Excluded   Immunization History  Administered Date(s) Administered  . PPD Test 09/02/2013, 09/06/2014, 09/24/2015  . Pneumococcal-Unspecified 11/21/2009  . Tdap 11/21/2009, 03/12/2015   Past Surgical History:  Procedure Laterality Date  . ANTERIOR CRUCIATE LIGAMENT REPAIR     bilateral  . KNEE ARTHROSCOPY WITH MEDIAL MENISECTOMY Right 01/28/2016   Procedure: KNEE ARTHROSCOPY WITH PARTIAL MEDIAL MENISCECTOMY;  Surgeon: Jones Broom, MD;  Location: [facility];  Service: Orthopedics;  Laterality: Right;  KNEE ARTHROSCOPY WITH PARTIAL MEDIAL MENISECTOMY  . SHOULDER ARTHROSCOPY W/ ROTATOR CUFF REPAIR Right 02/2015   Family History  Problem Relation Age of Onset  . Hypertension Father   .  Hyperlipidemia Father    Social History  . Marital status: Married    Spouse name: N/A  . Number of children: N/A  . Years of education: N/A   Occupational History  . 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 Smoking status: Former Smoker    Packs/day: 0.00    Years: 0.00    Quit date: 02/09/2005  . Smokeless tobacco: Never Used  . Alcohol use No  . Drug use: No  . Sexual activity: Active    ROS Constitutional: Denies fever, chills, weight loss/gain, headaches, insomnia,  night sweats or  change in appetite. Does c/o fatigue. Eyes: Denies redness, blurred vision, diplopia, discharge, itchy or watery eyes.  ENT: Denies discharge, congestion, post nasal drip, epistaxis, sore throat, earache, hearing loss, dental pain, Tinnitus, Vertigo, Sinus pain or snoring.  Cardio: Denies chest pain, palpitations, irregular heartbeat, syncope, dyspnea, diaphoresis, orthopnea, PND, claudication or edema Respiratory: denies cough, dyspnea, DOE, pleurisy, hoarseness, laryngitis or wheezing.  Gastrointestinal: Denies dysphagia, heartburn, reflux, water brash, pain, cramps, nausea, vomiting, bloating, diarrhea, constipation, hematemesis, melena, hematochezia, jaundice or hemorrhoids Genitourinary: Denies dysuria, frequency, urgency, nocturia, hesitancy, discharge, hematuria or flank pain Musculoskeletal: Denies arthralgia, myalgia, stiffness, Jt. Swelling, pain, limp or strain/sprain. Denies Falls. Skin: Denies puritis, rash, hives, warts, acne, eczema or change in skin lesion Neuro: No weakness, tremor, incoordination, spasms, paresthesia or pain Psychiatric: Denies confusion, memory loss or sensory loss. Denies Depression. Endocrine: Denies change in weight, skin, hair change, nocturia, and paresthesia, diabetic polys, visual blurring or hyper / hypo glycemic episodes.  Heme/Lymph: No excessive bleeding, bruising or enlarged lymph nodes.  Physical Exam  BP 112/70   Pulse (!) 48   Temp (!) 97.5 F (36.4 C)   Resp 18   Ht 6' 1.5" (1.867 m)   Wt 219 lb (99.3 kg)   BMI 28.50 kg/m   General Appearance: Well nourished and well groomed and in no apparent distress.  Eyes: PERRLA, EOMs, conjunctiva no swelling or erythema, normal fundi and vessels. Sinuses: No frontal/maxillary tenderness ENT/Mouth: EACs patent / TMs  nl. Nares clear without erythema, swelling, mucoid exudates. Oral hygiene is good. No erythema, swelling, or exudate. Tongue normal, non-obstructing. Tonsils not swollen or  erythematous. Hearing normal.  Neck: Supple, thyroid normal. No bruits, nodes or JVD. Respiratory: Respiratory effort normal.  BS equal and clear bilateral without rales, rhonci, wheezing or stridor. Cardio: Heart sounds are normal with regular rate and rhythm and no murmurs, rubs or gallops. Peripheral pulses are normal and equal bilaterally without edema. No aortic or femoral bruits. Chest: symmetric with normal excursions and percussion.  Abdomen: Soft, with Nl bowel sounds. Nontender, no guarding, rebound, hernias, masses, or organomegaly.  Lymphatics: Non tender without lymphadenopathy.  Genitourinary: No hernias.Testes nl. DRE - prostate nl for age - smooth & firm w/o nodules. Musculoskeletal: Full ROM all peripheral extremities, joint stability, 5/5 strength, and normal gait. Skin: Warm and dry without rashes, lesions, cyanosis, clubbing or  ecchymosis.  Neuro: Cranial nerves intact, reflexes equal bilaterally. Normal muscle tone, no cerebellar symptoms. Sensation intact.  Pysch: Alert and oriented X 3 with normal affect, insight and judgment appropriate.   Assessment and Plan  1. Annual Preventative/Screening Exam   2. Essential hypertension  - EKG 12-Lead - Urinalysis, Routine w reflex microscopic - Microalbumin / creatinine urine ratio - CBC with Differential/Platelet - BASIC METABOLIC PANEL WITH GFR - Magnesium - TSH  3. Hyperlipidemia, mixed  -  EKG 12-Lead - Hepatic function panel - Lipid panel - TSH  4. Abnormal glucose  - Hemoglobin A1c - Insulin, fasting  5. Vitamin D deficiency  - VITAMIN D 25 Hydroxy   6. Gastroesophageal reflux disease  - CBC with Differential/Platelet  7. Screening examination for pulmonary tuberculosis  - PPD  8. Screening for ischemic heart disease  - EKG 12-Lead  9. Screening for colorectal cancer  - POC Hemoccult Bld/Stl  10. Fatigue, unspecified type  - Iron,Total/Total Iron Binding Cap - Vitamin B12 - CBC with  Differential/Platelet - TSH  11. Medication management  - Urinalysis, Routine w reflex microscopic - Microalbumin / creatinine urine ratio - CBC with Differential/Platelet - BASIC METABOLIC PANEL WITH GFR - Hepatic function panel - Magnesium - Lipid panel - TSH - Hemoglobin A1c - Insulin, fasting - VITAMIN D 25 Hydroxy        Patient was counseled in prudent diet, weight control to achieve/maintain BMI less than 25, BP monitoring, regular exercise and medications as discussed.  Discussed med effects and SE's. Routine screening labs and tests as requested with regular follow-up as recommended. Over 40 minutes of exam, counseling, chart review and high complex critical decision making was performed

## 2016-10-23 LAB — CBC WITH DIFFERENTIAL/PLATELET
BASOS PCT: 1.1 %
Basophils Absolute: 42 cells/uL (ref 0–200)
EOS ABS: 91 {cells}/uL (ref 15–500)
Eosinophils Relative: 2.4 %
HCT: 45.4 % (ref 38.5–50.0)
Hemoglobin: 15.5 g/dL (ref 13.2–17.1)
Lymphs Abs: 1167 cells/uL (ref 850–3900)
MCH: 29.3 pg (ref 27.0–33.0)
MCHC: 34.1 g/dL (ref 32.0–36.0)
MCV: 85.8 fL (ref 80.0–100.0)
MONOS PCT: 11.4 %
MPV: 10.4 fL (ref 7.5–12.5)
Neutro Abs: 2067 cells/uL (ref 1500–7800)
Neutrophils Relative %: 54.4 %
Platelets: 204 10*3/uL (ref 140–400)
RBC: 5.29 10*6/uL (ref 4.20–5.80)
RDW: 12.1 % (ref 11.0–15.0)
TOTAL LYMPHOCYTE: 30.7 %
WBC mixed population: 433 cells/uL (ref 200–950)
WBC: 3.8 10*3/uL (ref 3.8–10.8)

## 2016-10-23 LAB — LIPID PANEL
CHOLESTEROL: 168 mg/dL (ref <200)
HDL: 42 mg/dL (ref >40)
LDL Cholesterol (Calc): 107 mg/dL (calc) — ABNORMAL HIGH
Non-HDL Cholesterol (Calc): 126 mg/dL (calc) (ref <130)
Total CHOL/HDL Ratio: 4 (calc) (ref <5.0)
Triglycerides: 98 mg/dL (ref <150)

## 2016-10-23 LAB — HEPATIC FUNCTION PANEL
AG Ratio: 1.8 (calc) (ref 1.0–2.5)
ALKALINE PHOSPHATASE (APISO): 50 U/L (ref 40–115)
ALT: 12 U/L (ref 9–46)
AST: 18 U/L (ref 10–40)
Albumin: 4.8 g/dL (ref 3.6–5.1)
BILIRUBIN INDIRECT: 0.5 mg/dL (ref 0.2–1.2)
Bilirubin, Direct: 0.1 mg/dL (ref 0.0–0.2)
Globulin: 2.6 g/dL (calc) (ref 1.9–3.7)
TOTAL PROTEIN: 7.4 g/dL (ref 6.1–8.1)
Total Bilirubin: 0.6 mg/dL (ref 0.2–1.2)

## 2016-10-23 LAB — IRON, TOTAL/TOTAL IRON BINDING CAP
%SAT: 33 % (ref 15–60)
Iron: 106 ug/dL (ref 50–180)
TIBC: 324 ug/dL (ref 250–425)

## 2016-10-23 LAB — BASIC METABOLIC PANEL WITH GFR
BUN: 11 mg/dL (ref 7–25)
CHLORIDE: 100 mmol/L (ref 98–110)
CO2: 29 mmol/L (ref 20–32)
Calcium: 10.1 mg/dL (ref 8.6–10.3)
Creat: 0.87 mg/dL (ref 0.60–1.35)
GFR, Est African American: 127 mL/min/{1.73_m2} (ref >=60)
GFR, Est Non African American: 109 mL/min/{1.73_m2} (ref >=60)
GLUCOSE: 81 mg/dL (ref 65–99)
POTASSIUM: 4.6 mmol/L (ref 3.5–5.3)
SODIUM: 139 mmol/L (ref 135–146)

## 2016-10-23 LAB — URINE CULTURE
MICRO NUMBER:: 81006263
SPECIMEN QUALITY: ADEQUATE

## 2016-10-23 LAB — URINALYSIS, ROUTINE W REFLEX MICROSCOPIC
BILIRUBIN URINE: NEGATIVE
GLUCOSE, UA: NEGATIVE
HGB URINE DIPSTICK: NEGATIVE
Ketones, ur: NEGATIVE
LEUKOCYTES UA: NEGATIVE
Nitrite: NEGATIVE
PH: 7.5 (ref 5.0–8.0)
Protein, ur: NEGATIVE
Specific Gravity, Urine: 1.01 (ref 1.001–1.03)

## 2016-10-23 LAB — MAGNESIUM: MAGNESIUM: 2.1 mg/dL (ref 1.5–2.5)

## 2016-10-23 LAB — MICROALBUMIN / CREATININE URINE RATIO
Creatinine, Urine: 49 mg/dL (ref 20–320)
MICROALB/CREAT RATIO: 6 ug/mg{creat} (ref <30)
Microalb, Ur: 0.3 mg/dL

## 2016-10-23 LAB — TSH: TSH: 1.13 m[IU]/L (ref 0.40–4.50)

## 2016-10-23 LAB — HEMOGLOBIN A1C
Hgb A1c MFr Bld: 4.8 % of total Hgb (ref <5.7)
Mean Plasma Glucose: 91 (calc)
eAG (mmol/L): 5 (calc)

## 2016-10-23 LAB — VITAMIN D 25 HYDROXY (VIT D DEFICIENCY, FRACTURES): Vit D, 25-Hydroxy: 75 ng/mL (ref 30–100)

## 2016-10-23 LAB — INSULIN, FASTING: Insulin: 3 u[IU]/mL (ref 2.0–19.6)

## 2016-10-23 LAB — VITAMIN B12: VITAMIN B 12: 562 pg/mL (ref 200–1100)

## 2017-02-12 ENCOUNTER — Other Ambulatory Visit: Payer: Self-pay | Admitting: Internal Medicine

## 2017-02-19 IMAGING — CT CT ABD-PELV W/ CM
2 of 4 series · 15 of 46 positions shown, 17 images · IV contrast (Omni 300)
Comparison: CT of the abdomen and pelvis, and right upper quadrant
ultrasound, performed 08/13/2015

CLINICAL DATA: Acute onset of fever and generalized abdominal pain.
Recently diagnosed pancreatitis. Initial encounter.

EXAM:
CT ABDOMEN AND PELVIS WITH CONTRAST
TECHNIQUE: Multidetector CT imaging of the abdomen and pelvis was performed
using the standard protocol following bolus administration of
intravenous contrast.
CONTRAST:  100mL YEFGXY-0DD IOPAMIDOL (YEFGXY-0DD) INJECTION 61%

[Series 2: a/p w/ 5mm · axial · 0.91mm/px · z∈[+604,+1154]mm · 12 of 125 slices shown, 14 images]
[im 10/125  soft-tissue]
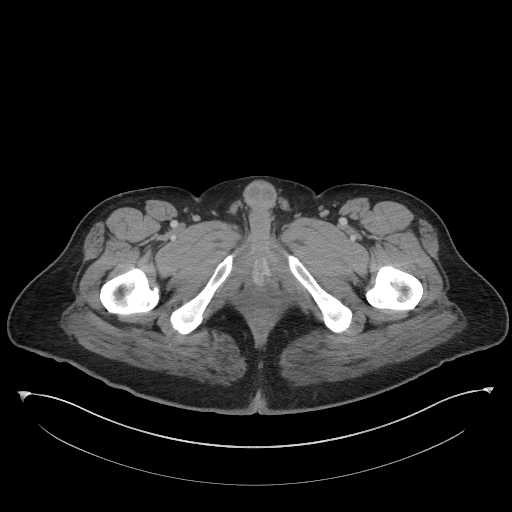
[im 10/125  bone]
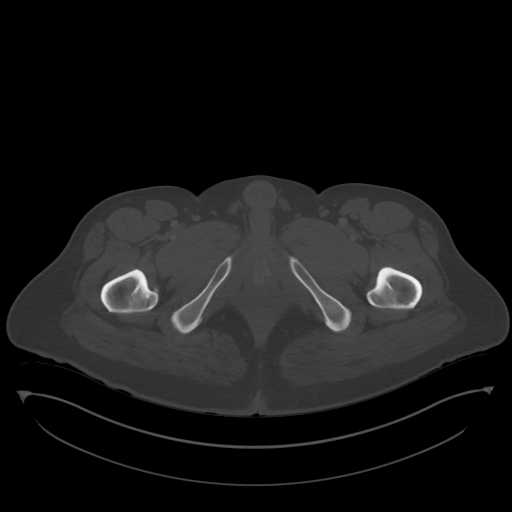
[im 20/125  soft-tissue]
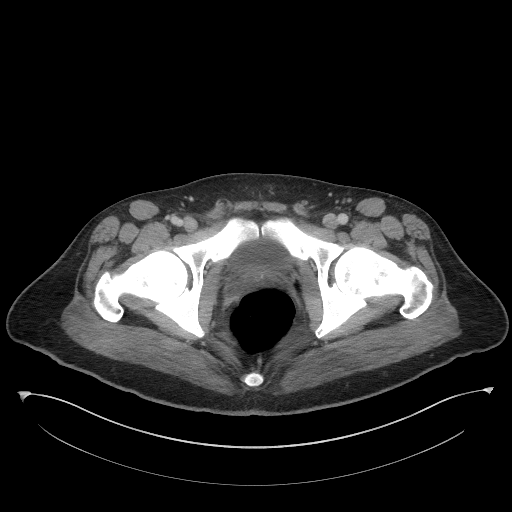
[im 30/125  soft-tissue]
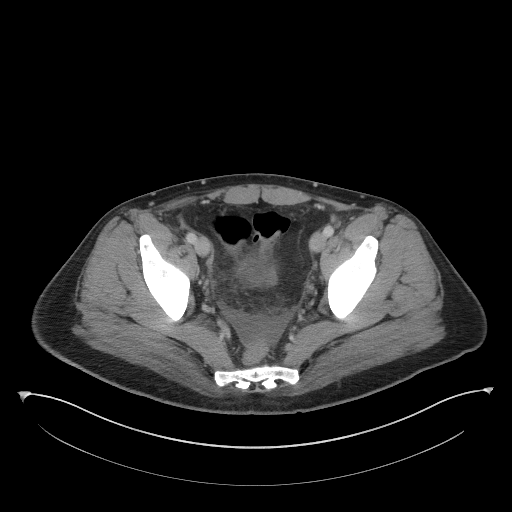
[im 40/125  soft-tissue]
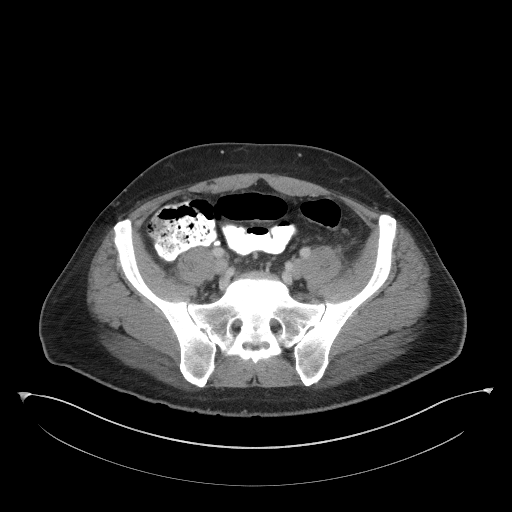
[im 50/125  soft-tissue]
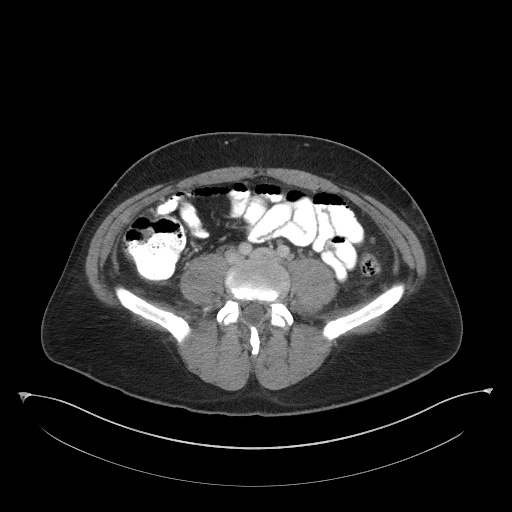
[im 60/125  soft-tissue]
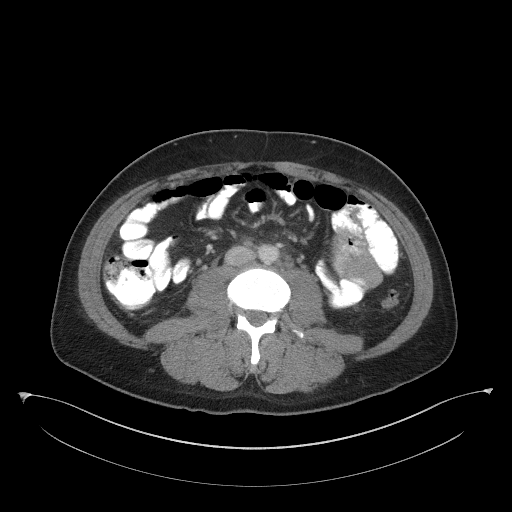
[im 70/125  soft-tissue]
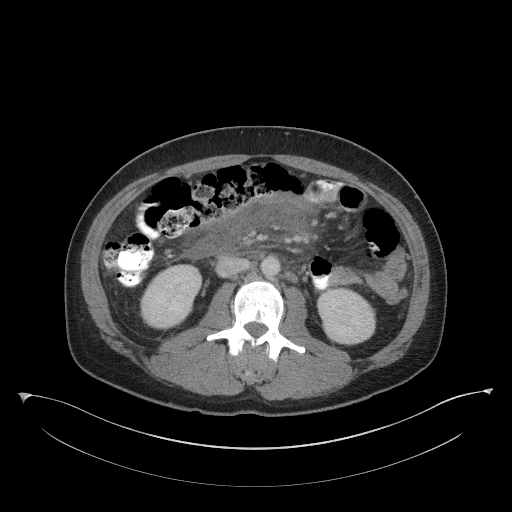
[im 80/125  soft-tissue]
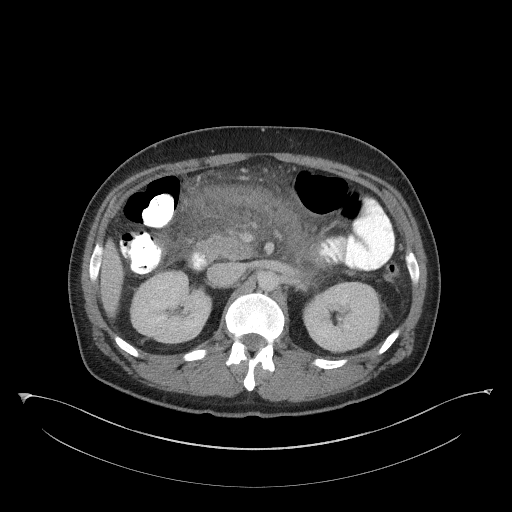
[im 90/125  soft-tissue]
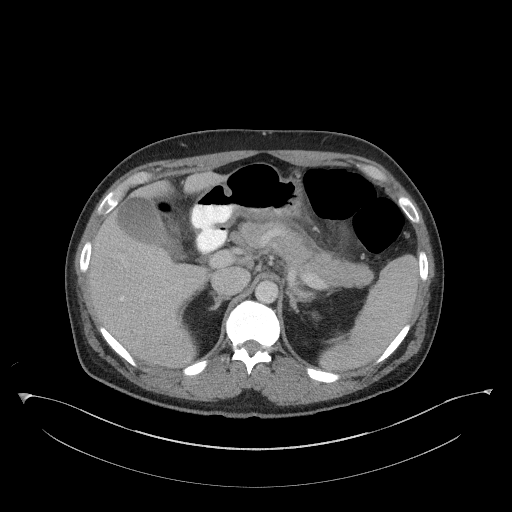
[im 90/125  bone]
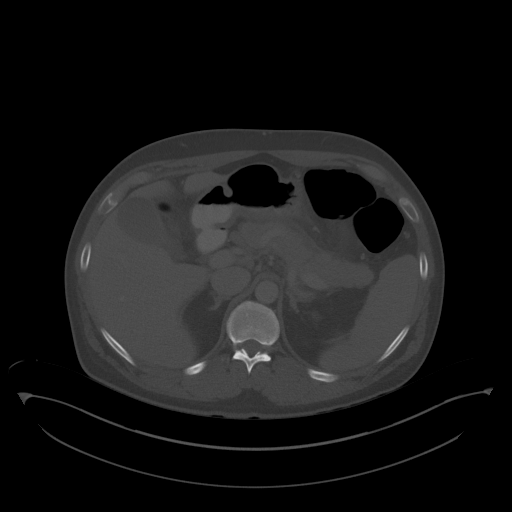
[im 100/125  soft-tissue]
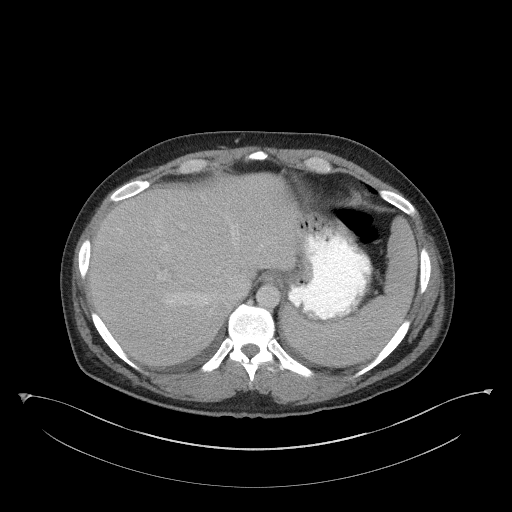
[im 110/125  soft-tissue]
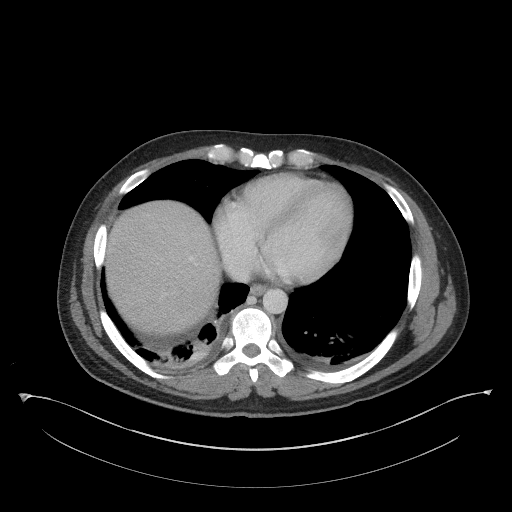
[im 120/125  soft-tissue]
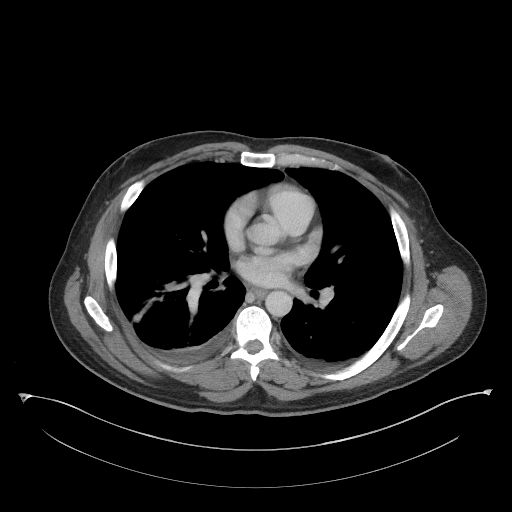

[Series 5: a/p w/ cor · coronal · 0.77mm/px · 3 of 150 slices shown]
[im 50/150  soft-tissue]
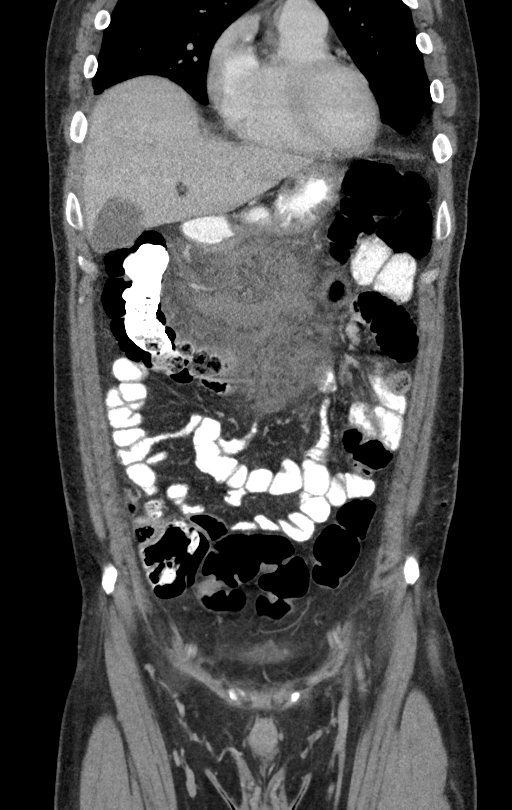
[im 67/150  soft-tissue]
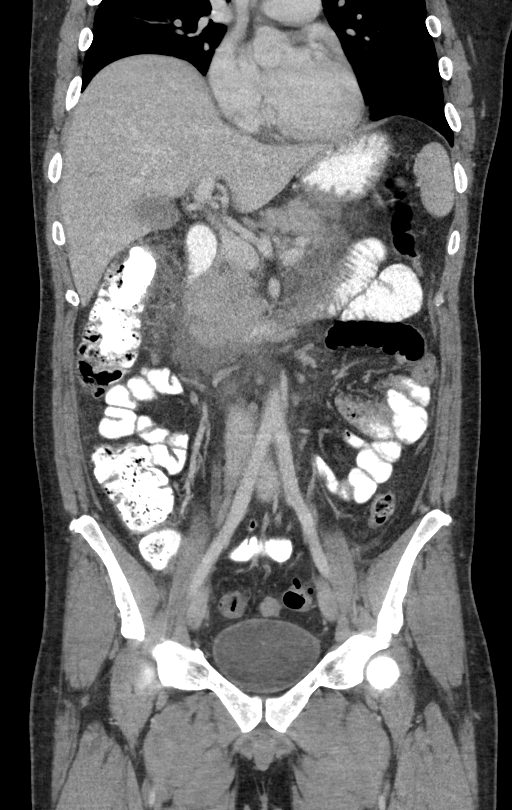
[im 83/150  soft-tissue]
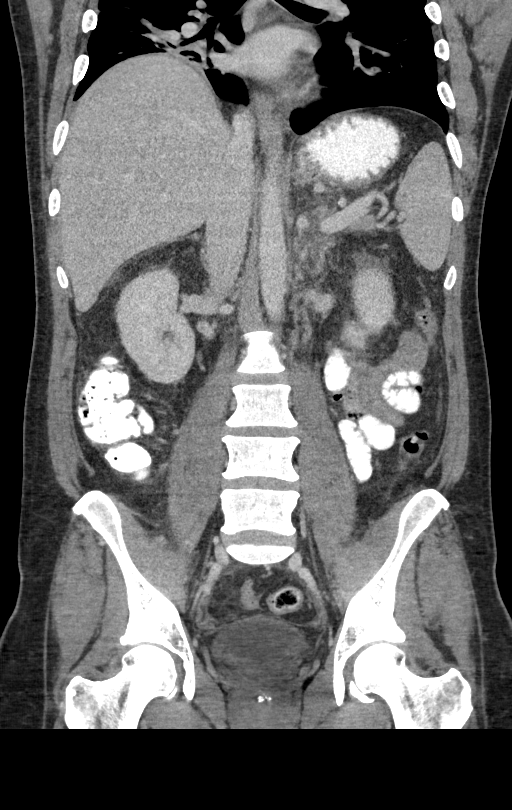

[15 of 46 positions shown; findings below may reference images not displayed]

FINDINGS: Trace bilateral pleural fluid is noted, with bibasilar atelectasis.

There has been interval worsening in the appearance of the patient's
pancreatitis, with diffusely increased inflammation tracking about
the majority of the pancreas, and new marked phlegmon tracking
inferiorly along the small bowel mesentery to the level of the mid
abdomen.

No definite pseudocyst formation is seen at this time. There is no
obvious devascularization of the pancreas.

The liver and spleen are unremarkable in appearance. The gallbladder
is within normal limits. The adrenal glands are unremarkable in
appearance.

The kidneys are unremarkable in appearance. There is no evidence of
hydronephrosis. No renal or ureteral stones are seen. Minimal
nonspecific perinephric stranding is noted bilaterally.

No free fluid is identified. The small bowel is unremarkable in
appearance. The stomach is within normal limits. No acute vascular
abnormalities are seen.

The appendix is normal in caliber and contains air, without evidence
of appendicitis. The colon is grossly unremarkable in appearance.

The bladder is mildly distended and grossly unremarkable. The
prostate is borderline normal in size. No inguinal lymphadenopathy
is seen.

No acute osseous abnormalities are identified.
IMPRESSION: 1. Interval worsening in appearance of pancreatitis, with diffusely
increased inflammation tracking about the majority of the pancreas,
and new marked phlegmon tracking inferiorly along the small bowel
mesentery to the level of the mid abdomen. No definite
devascularization or pseudocyst formation seen at this time.
2. Trace bilateral pleural fluid, with bibasilar atelectasis.

## 2017-03-19 DIAGNOSIS — J0140 Acute pansinusitis, unspecified: Secondary | ICD-10-CM | POA: Diagnosis not present

## 2017-04-20 ENCOUNTER — Encounter: Payer: Self-pay | Admitting: Adult Health

## 2017-04-20 DIAGNOSIS — E663 Overweight: Secondary | ICD-10-CM | POA: Insufficient documentation

## 2017-04-20 NOTE — Progress Notes (Signed)
FOLLOW UP  Assessment and Plan:   Hypertension Well controlled with current medications  Monitor blood pressure at home; patient to call if consistently greater than 130/80 Continue DASH diet.   Reminder to go to the ER if any CP, SOB, nausea, dizziness, severe HA, changes vision/speech, left arm numbness and tingling and jaw pain.  Cholesterol Currently at goal by lifestyle only Continue low cholesterol diet and exercise.  Check lipid panel.   Other abnormal glucose Recent A1Cs at goal Discussed diet/exercise, weight management  Defer A1C; check BMP  Overweight Long discussion about weight loss, diet, and exercise Recommended diet heavy in fruits and veggies and low in animal meats, cheeses, and dairy products, appropriate calorie intake Discussed ideal weight for height and initial weight goal (200 lb) Patient will work on increasing exercise, continue to watch diet and aim for calorie deficit Will follow up in 3 months  Vitamin D Def At goal at last visit; continue supplementation to maintain goal of 70-100 Defer Vit D level  Anxiety Well managed by current regimen; continue medications as needed Stress management techniques discussed, increase water, good sleep hygiene discussed, increase exercise, and increase veggies.   Continue diet and meds as discussed. Further disposition pending results of labs. Discussed med's effects and SE's.   Over 30 minutes of exam, counseling, chart review, and critical decision making was performed.   Future Appointments  Date Time Provider Department Center  11/03/2017  9:00 AM Lucky Cowboy, MD [facility] None    ----------------------------------------------------------------------------------------------------------------------  HPI 37 y.o. male  presents for 3 month follow up on hypertension, cholesterol, glucose management, weight, anxiety and vitamin D deficiency.   he has a diagnosis of anxiety and is currently on xanax  0.5 mg once daily PRN, reports symptoms are well controlled on current regimen. he reports he rarely needs this anymore - previously needed 1/2 tab nearly everyday. He reports recent use is once a month if that -   BMI is Body mass index is 29.23 kg/m., he has been working on diet and exercise. Wt Readings from Last 3 Encounters:  04/21/17 224 lb 9.6 oz (101.9 kg)  10/22/16 219 lb (99.3 kg)  03/24/16 215 lb (97.5 kg)   His blood pressure has been controlled at home, today their BP is BP: 130/88  He does workout. He denies chest pain, shortness of breath, dizziness.   He is not on cholesterol medication and denies myalgias. His cholesterol is at goal. The cholesterol last visit was:   Lab Results  Component Value Date   CHOL 168 10/22/2016   HDL 42 10/22/2016   LDLCALC 85 09/24/2015   TRIG 98 10/22/2016   CHOLHDL 4.0 10/22/2016    He has been working on diet and exercise for glucose management, and denies foot ulcerations, increased appetite, nausea, paresthesia of the feet, polydipsia, polyuria, visual disturbances, vomiting and weight loss. Last A1C in the office was:  Lab Results  Component Value Date   HGBA1C 4.8 10/22/2016   Patient is on Vitamin D supplement and at goal at recent check:   Lab Results  Component Value Date   VD25OH 75 10/22/2016        Current Medications:  Current Outpatient Medications on File Prior to Visit  Medication Sig  . ALPRAZolam (XANAX) 1 MG tablet Take 0.5 mg by mouth at bedtime as needed for anxiety.  . bisoprolol (ZEBETA) 5 MG tablet Take 1 tablet (5 mg total) by mouth daily.  . multivitamin (ONE-A-DAY MEN'S) TABS tablet  Take 1 tablet by mouth daily.  . psyllium (METAMUCIL) 58.6 % packet Take 1 packet by mouth daily.  . ranitidine (ZANTAC) 150 MG capsule Take 150 mg by mouth 2 (two) times daily. Reported on 08/21/2015  . bisoprolol (ZEBETA) 5 MG tablet TAKE ONE TABLET BY MOUTH ONCE DAILY   No current facility-administered medications on  file prior to visit.      Allergies: No Known Allergies   Medical History:  Past Medical History:  Diagnosis Date  . Alcoholic pancreatitis   . Dental crowns present   . GERD (gastroesophageal reflux disease)   . History of pancreatitis 08/2015  . Hypertension    states under control with med., has been on med. x 4 yr.  . Medial meniscus tear 01/2016   right knee   Family history- Reviewed and unchanged Social history- Reviewed and unchanged   Review of Systems:  Review of Systems  Constitutional: Negative for malaise/fatigue and weight loss.  HENT: Negative for hearing loss and tinnitus.   Eyes: Negative for blurred vision and double vision.  Respiratory: Negative for cough, shortness of breath and wheezing.   Cardiovascular: Negative for chest pain, palpitations, orthopnea, claudication and leg swelling.  Gastrointestinal: Negative for abdominal pain, blood in stool, constipation, diarrhea, heartburn, melena, nausea and vomiting.  Genitourinary: Negative.   Musculoskeletal: Negative for joint pain and myalgias.  Skin: Negative for rash.  Neurological: Negative for dizziness, tingling, sensory change, weakness and headaches.  Endo/Heme/Allergies: Negative for polydipsia.  Psychiatric/Behavioral: Negative.   All other systems reviewed and are negative.     Physical Exam: BP 130/88   Pulse (!) 53   Temp 97.7 F (36.5 C)   Ht 6' 1.5" (1.867 m)   Wt 224 lb 9.6 oz (101.9 kg)   SpO2 98%   BMI 29.23 kg/m  Wt Readings from Last 3 Encounters:  04/21/17 224 lb 9.6 oz (101.9 kg)  10/22/16 219 lb (99.3 kg)  03/24/16 215 lb (97.5 kg)   General Appearance: Well nourished, in no apparent distress. Eyes: PERRLA, EOMs, conjunctiva no swelling or erythema Sinuses: No Frontal/maxillary tenderness ENT/Mouth: Ext aud canals clear, TMs without erythema, bulging. No erythema, swelling, or exudate on post pharynx.  Tonsils not swollen or erythematous. Hearing normal.  Neck:  Supple, thyroid normal.  Respiratory: Respiratory effort normal, BS equal bilaterally without rales, rhonchi, wheezing or stridor.  Cardio: RRR with no MRGs. Brisk peripheral pulses without edema.  Abdomen: Soft, + BS.  Non tender, no guarding, rebound, hernias, masses. Lymphatics: Non tender without lymphadenopathy.  Musculoskeletal: Full ROM, 5/5 strength, Normal gait Skin: Warm, dry without rashes, lesions, ecchymosis.  Neuro: Cranial nerves intact. No cerebellar symptoms.  Psych: Awake and oriented X 3, normal affect, Insight and Judgment appropriate.    Dan MakerAshley C Jarquez Mestre, NP 9:01 AM [facility]

## 2017-04-21 ENCOUNTER — Ambulatory Visit: Payer: BLUE CROSS/BLUE SHIELD | Admitting: Adult Health

## 2017-04-21 ENCOUNTER — Encounter: Payer: Self-pay | Admitting: Adult Health

## 2017-04-21 VITALS — BP 130/88 | HR 53 | Temp 97.7°F | Ht 73.5 in | Wt 224.6 lb

## 2017-04-21 DIAGNOSIS — R7309 Other abnormal glucose: Secondary | ICD-10-CM | POA: Diagnosis not present

## 2017-04-21 DIAGNOSIS — E663 Overweight: Secondary | ICD-10-CM | POA: Diagnosis not present

## 2017-04-21 DIAGNOSIS — E559 Vitamin D deficiency, unspecified: Secondary | ICD-10-CM | POA: Diagnosis not present

## 2017-04-21 DIAGNOSIS — E782 Mixed hyperlipidemia: Secondary | ICD-10-CM

## 2017-04-21 DIAGNOSIS — I10 Essential (primary) hypertension: Secondary | ICD-10-CM

## 2017-04-21 DIAGNOSIS — Z79899 Other long term (current) drug therapy: Secondary | ICD-10-CM

## 2017-04-21 DIAGNOSIS — F419 Anxiety disorder, unspecified: Secondary | ICD-10-CM | POA: Diagnosis not present

## 2017-04-21 NOTE — Patient Instructions (Signed)
Drink 1/2 your body weight in fluid ounces of water daily; drink a tall glass of water 30 min before meals  Don't eat until you're stuffed- listen to your stomach and eat until you are 80% full   Try eating off of a salad plate; wait 10 min after finishing before going back for seconds  Start by eating the vegetables on your plate; aim for 21%50% of your meals to be fruits or vegetables  Then eat your protein - lean meats (grass fed if possible), fish, beans, nuts in moderation  Eat your carbs/starch last ONLY if you still are hungry. If you can, stop before finishing it all  Avoid sugar and flour - the closer it looks to it's original form in nature, typically the better it is for you  Splurge in moderation - "assign" days when you get to splurge and have the "bad stuff" - I like to follow a 80% - 20% plan- "good" choices 80 % of the time, "bad" choices in moderation 20% of the time  Simple equation is: Calories out > calories in = weight loss - even if you eat the bad stuff, if you limit portions, you will still lose weight       When it comes to diets, agreement about the perfect plan isn't easy to find, even among the experts. Experts at the [employer] developed an idea known as the Healthy Eating Plate. Just imagine a plate divided into logical, healthy portions.  The emphasis is on diet quality:  Load up on vegetables and fruits - one-half of your plate: Aim for color and variety, and remember that potatoes don't count.  Go for whole grains - one-quarter of your plate: Whole wheat, barley, wheat berries, quinoa, oats, brown rice, and foods made with them. If you want pasta, go with whole wheat pasta.  Protein power - one-quarter of your plate: Fish, chicken, beans, and nuts are all healthy, versatile protein sources. Limit red meat.  The diet, however, does go beyond the plate, offering a few other suggestions.  Use healthy plant oils, such as olive,  canola, soy, corn, sunflower and peanut. Check the labels, and avoid partially hydrogenated oil, which have unhealthy trans fats.  If you're thirsty, drink water. Coffee and tea are good in moderation, but skip sugary drinks and limit milk and dairy products to one or two daily servings.  The type of carbohydrate in the diet is more important than the amount. Some sources of carbohydrates, such as vegetables, fruits, whole grains, and beans-are healthier than others.  Finally, stay active.   Intermittent fasting is more about strategy than starvation. It's meant to reset your body in different ways, hopefully with fitness and nutrition changes as a result.  Like any big switchover, though, results may vary when it comes down to the individual level. What works for your friends may not work for you, or vice versa. That's why it's helpful to play around with variations on intermittent fasting and healthy habits and find what works best for you.  WHAT IS INTERMITTENT FASTING AND WHY DO IT?  Intermittent fasting doesn't involve specific foods, but rather, a strict schedule regarding when you eat. Also called "time-restricted eating," the tactic has been praised for its contribution to weight loss, improved body composition, and decreased cravings. Preliminary research also suggests it may be beneficial for glucose tolerance, hormone regulation, better muscle mass and lower body fat.  Part of its appeal is the  simplicity of the effort. Unlike some other trends, there's no calculations to intermittent fasting.  You simply eat within a certain block of time, usually a window of 8-10 hours. In the other big block of time - about 14-16 hours, including when you're asleep - you don't eat anything, not even snacks. You can drink water, coffee, tea or any other beverage that doesn't have calories.  For example, if you like having a late dinner, you might skip breakfast and have your first meal at noon and  your last meal of the day at 8 p.m., and then not eat until noon again the next day.  IDEAS FOR GETTING STARTED  If you're new to the strategy, it may be helpful to eat within the typical circadian rhythm and keep eating within daylight hours. This can be especially beneficial if you're looking at intermittent fasting for weight-loss goals.  So first try only eating between 12pm to 8pm.  Outside of this time you may have water, black coffee, and hot tea. You may not eat it drink anything that has carbs, sugars, OR artificial sugars like diet soda.   Like any major eating and fitness shift, it can take time to find the perfect fit, so don't be afraid to experiment with different options - including ditching intermittent fasting altogether if it's simply not for you. But if it is, you may be surprised by some of the benefits that come along with the strategy.

## 2017-04-22 LAB — CBC WITH DIFFERENTIAL/PLATELET
BASOS PCT: 0.8 %
Basophils Absolute: 32 cells/uL (ref 0–200)
EOS PCT: 2.5 %
Eosinophils Absolute: 100 cells/uL (ref 15–500)
HCT: 45.8 % (ref 38.5–50.0)
Hemoglobin: 15.4 g/dL (ref 13.2–17.1)
Lymphs Abs: 1284 cells/uL (ref 850–3900)
MCH: 29.3 pg (ref 27.0–33.0)
MCHC: 33.6 g/dL (ref 32.0–36.0)
MCV: 87.2 fL (ref 80.0–100.0)
MONOS PCT: 11.3 %
MPV: 10 fL (ref 7.5–12.5)
Neutro Abs: 2132 cells/uL (ref 1500–7800)
Neutrophils Relative %: 53.3 %
PLATELETS: 195 10*3/uL (ref 140–400)
RBC: 5.25 10*6/uL (ref 4.20–5.80)
RDW: 13.5 % (ref 11.0–15.0)
TOTAL LYMPHOCYTE: 32.1 %
WBC mixed population: 452 cells/uL (ref 200–950)
WBC: 4 10*3/uL (ref 3.8–10.8)

## 2017-04-22 LAB — HEPATIC FUNCTION PANEL
AG Ratio: 1.7 (calc) (ref 1.0–2.5)
ALBUMIN MSPROF: 4.5 g/dL (ref 3.6–5.1)
ALT: 13 U/L (ref 9–46)
AST: 22 U/L (ref 10–40)
Alkaline phosphatase (APISO): 49 U/L (ref 40–115)
BILIRUBIN INDIRECT: 0.4 mg/dL (ref 0.2–1.2)
Bilirubin, Direct: 0.1 mg/dL (ref 0.0–0.2)
GLOBULIN: 2.7 g/dL (ref 1.9–3.7)
TOTAL PROTEIN: 7.2 g/dL (ref 6.1–8.1)
Total Bilirubin: 0.5 mg/dL (ref 0.2–1.2)

## 2017-04-22 LAB — LIPID PANEL
CHOL/HDL RATIO: 3.3 (calc) (ref <5.0)
CHOLESTEROL: 145 mg/dL (ref <200)
HDL: 44 mg/dL (ref >40)
LDL Cholesterol (Calc): 87 mg/dL (calc)
Non-HDL Cholesterol (Calc): 101 mg/dL (calc) (ref <130)
TRIGLYCERIDES: 59 mg/dL (ref <150)

## 2017-04-22 LAB — BASIC METABOLIC PANEL WITH GFR
BUN: 11 mg/dL (ref 7–25)
CALCIUM: 9.6 mg/dL (ref 8.6–10.3)
CHLORIDE: 102 mmol/L (ref 98–110)
CO2: 30 mmol/L (ref 20–32)
Creat: 0.83 mg/dL (ref 0.60–1.35)
GFR, Est African American: 129 mL/min/{1.73_m2} (ref >=60)
GFR, Est Non African American: 112 mL/min/{1.73_m2} (ref >=60)
GLUCOSE: 83 mg/dL (ref 65–99)
Potassium: 4.4 mmol/L (ref 3.5–5.3)
Sodium: 139 mmol/L (ref 135–146)

## 2017-04-22 LAB — TSH: TSH: 0.85 mIU/L (ref 0.40–4.50)

## 2017-06-01 ENCOUNTER — Other Ambulatory Visit: Payer: Self-pay | Admitting: Adult Health

## 2017-07-22 DIAGNOSIS — J019 Acute sinusitis, unspecified: Secondary | ICD-10-CM | POA: Diagnosis not present

## 2017-07-22 DIAGNOSIS — H66003 Acute suppurative otitis media without spontaneous rupture of ear drum, bilateral: Secondary | ICD-10-CM | POA: Diagnosis not present

## 2017-09-03 ENCOUNTER — Other Ambulatory Visit: Payer: Self-pay | Admitting: Internal Medicine

## 2017-10-01 ENCOUNTER — Other Ambulatory Visit: Payer: Self-pay | Admitting: Internal Medicine

## 2017-11-03 ENCOUNTER — Ambulatory Visit: Payer: BLUE CROSS/BLUE SHIELD | Admitting: Internal Medicine

## 2017-11-03 ENCOUNTER — Encounter: Payer: Self-pay | Admitting: Internal Medicine

## 2017-11-03 VITALS — BP 114/80 | HR 52 | Temp 97.5°F | Resp 16 | Ht 73.5 in | Wt 222.4 lb

## 2017-11-03 DIAGNOSIS — E559 Vitamin D deficiency, unspecified: Secondary | ICD-10-CM | POA: Diagnosis not present

## 2017-11-03 DIAGNOSIS — Z79899 Other long term (current) drug therapy: Secondary | ICD-10-CM

## 2017-11-03 DIAGNOSIS — Z1389 Encounter for screening for other disorder: Secondary | ICD-10-CM

## 2017-11-03 DIAGNOSIS — Z136 Encounter for screening for cardiovascular disorders: Secondary | ICD-10-CM

## 2017-11-03 DIAGNOSIS — Z130 Encounter for screening for diseases of the blood and blood-forming organs and certain disorders involving the immune mechanism: Secondary | ICD-10-CM

## 2017-11-03 DIAGNOSIS — K219 Gastro-esophageal reflux disease without esophagitis: Secondary | ICD-10-CM

## 2017-11-03 DIAGNOSIS — Z111 Encounter for screening for respiratory tuberculosis: Secondary | ICD-10-CM

## 2017-11-03 DIAGNOSIS — Z0000 Encounter for general adult medical examination without abnormal findings: Secondary | ICD-10-CM | POA: Diagnosis not present

## 2017-11-03 DIAGNOSIS — R5383 Other fatigue: Secondary | ICD-10-CM

## 2017-11-03 DIAGNOSIS — E782 Mixed hyperlipidemia: Secondary | ICD-10-CM

## 2017-11-03 DIAGNOSIS — Z1211 Encounter for screening for malignant neoplasm of colon: Secondary | ICD-10-CM

## 2017-11-03 DIAGNOSIS — Z13220 Encounter for screening for lipoid disorders: Secondary | ICD-10-CM | POA: Diagnosis not present

## 2017-11-03 DIAGNOSIS — R7309 Other abnormal glucose: Secondary | ICD-10-CM

## 2017-11-03 DIAGNOSIS — I10 Essential (primary) hypertension: Secondary | ICD-10-CM

## 2017-11-03 DIAGNOSIS — Z0001 Encounter for general adult medical examination with abnormal findings: Secondary | ICD-10-CM

## 2017-11-03 DIAGNOSIS — Z1212 Encounter for screening for malignant neoplasm of rectum: Secondary | ICD-10-CM

## 2017-11-03 DIAGNOSIS — Z1329 Encounter for screening for other suspected endocrine disorder: Secondary | ICD-10-CM | POA: Diagnosis not present

## 2017-11-03 DIAGNOSIS — Z87891 Personal history of nicotine dependence: Secondary | ICD-10-CM

## 2017-11-03 DIAGNOSIS — Z131 Encounter for screening for diabetes mellitus: Secondary | ICD-10-CM | POA: Diagnosis not present

## 2017-11-03 DIAGNOSIS — Z8249 Family history of ischemic heart disease and other diseases of the circulatory system: Secondary | ICD-10-CM

## 2017-11-03 NOTE — Patient Instructions (Signed)

## 2017-11-03 NOTE — Progress Notes (Signed)
[facility]   Lucky CowboyWilliam Dreamer Nguyen, M.D.     Dyanne CarrelAmanda R. Steffanie Dunnollier, P.A.-C Judd GaudierAshley Corbett, DNP [facility]                [address]                [place], [place] [id number] Telephone [phone] Telefax [phone] Annual  Screening/Preventative Visit  & Comprehensive Evaluation & Examination     This very nice 37 y.o. MWM presents for a Screening /Preventative Visit & comprehensive evaluation and management of multiple medical co-morbidities.  Patient has been followed for HTN, HLD, Prediabetes and Vitamin D Deficiency.  Patient has GERD controlled w/diet & his meds.     HTN predates since  2011. Patient's BP has been controlled on meds. Today's BP is at goal - 114/80. Patient denies any cardiac symptoms as chest pain, palpitations, shortness of breath, dizziness or ankle swelling.     Patient's hyperlipidemia is controlled with diet and medications. Patient denies myalgias or other medication SE's. Last lipids were at goal: Lab Results  Component Value Date   CHOL 145 04/21/2017   HDL 44 04/21/2017   LDLCALC 87 04/21/2017   TRIG 59 04/21/2017   CHOLHDL 3.3 04/21/2017      Patient has hx/o Morbid Obesity (wt 280#/2004 now down to  222#) and he is monitored expectantly for  prediabetes and patient denies reactive hypoglycemic symptoms, visual blurring, diabetic polys or paresthesias. Last A1c was Normal & at goal: Lab Results  Component Value Date   HGBA1C 4.8 10/22/2016       Finally, patient has history of Vitamin D Deficiency and last vitamin D was at goal: Lab Results  Component Value Date   VD25OH 6075 10/22/2016   Current Outpatient Medications on File Prior to Visit  Medication Sig  . ALPRAZolam (XANAX) 1 MG tablet Take 1/2 to 1 tablet 2 to 3 x / day ONLY if needed for Anxiety Attack & please try to limit to 5 days /week to avoid addiction  . bisoprolol (ZEBETA) 5 MG tablet TAKE 1 TABLET BY MOUTH ONCE DAILY   . multivitamin (ONE-A-DAY MEN'S) TABS tablet Take 1 tablet by mouth daily.  . psyllium (METAMUCIL) 58.6 % packet Take 1 packet by mouth daily.  . ranitidine (ZANTAC) 150 MG capsule Take 150 mg by mouth 2 (two) times daily. Reported on 08/21/2015   No current facility-administered medications on file prior to visit.    No Known Allergies   Past Medical History:  Diagnosis Date  . Alcoholic pancreatitis   . Dental crowns present   . GERD (gastroesophageal reflux disease)   . History of pancreatitis 08/2015  . Hypertension    states under control with med., has been on med. x 4 yr.  . Medial meniscus tear 01/2016   right knee   Health Maintenance  Topic Date Due  . TETANUS/TDAP  03/11/2025  . HIV Screening  Completed  . INFLUENZA VACCINE  Discontinued   Immunization History  Administered Date(s) Administered  . PPD Test 09/02/2013, 09/06/2014, 09/24/2015, 10/22/2016  . Pneumococcal-Unspecified 11/21/2009  . Tdap 11/21/2009, 03/12/2015   Past Surgical History:  Procedure Laterality Date  . ANTERIOR CRUCIATE LIGAMENT REPAIR     bilateral  . KNEE ARTHROSCOPY WITH MEDIAL MENISECTOMY Right 01/28/2016   Procedure: KNEE ARTHROSCOPY WITH PARTIAL MEDIAL MENISCECTOMY;  Surgeon: Jones BroomJustin Chandler, MD;  Location: [facility];  Service: Orthopedics;  Laterality: Right;  KNEE ARTHROSCOPY  WITH PARTIAL MEDIAL MENISECTOMY  . SHOULDER ARTHROSCOPY W/ ROTATOR CUFF REPAIR Right 02/2015   Family History  Problem Relation Age of Onset  . Hypertension Father   . Hyperlipidemia Father    Social History   Socioeconomic History  . Marital status: Married    Spouse name: Not on file  . Number of children: Not on file  . Years of education: Not on file  . Highest education level: Not on file  Occupational History  . 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 Smoking status: Former Smoker    Packs/day: 0.00    Years: 0.00    Pack years: 0.00    Last  attempt to quit: 02/09/2005    Years since quitting: 12.7  . Smokeless tobacco: Never Used  Substance and Sexual Activity  . Alcohol use: No  . Drug use: No  . Sexual activity: Not on file    ROS Constitutional: Denies fever, chills, weight loss/gain, headaches, insomnia,  night sweats or change in appetite. Does c/o fatigue. Eyes: Denies redness, blurred vision, diplopia, discharge, itchy or watery eyes.  ENT: Denies discharge, congestion, post nasal drip, epistaxis, sore throat, earache, hearing loss, dental pain, Tinnitus, Vertigo, Sinus pain or snoring.  Cardio: Denies chest pain, palpitations, irregular heartbeat, syncope, dyspnea, diaphoresis, orthopnea, PND, claudication or edema Respiratory: denies cough, dyspnea, DOE, pleurisy, hoarseness, laryngitis or wheezing.  Gastrointestinal: Denies dysphagia, heartburn, reflux, water brash, pain, cramps, nausea, vomiting, bloating, diarrhea, constipation, hematemesis, melena, hematochezia, jaundice or hemorrhoids Genitourinary: Denies dysuria, frequency, urgency, nocturia, hesitancy, discharge, hematuria or flank pain Musculoskeletal: Denies arthralgia, myalgia, stiffness, Jt. Swelling, pain, limp or strain/sprain. Denies Falls. Skin: Denies puritis, rash, hives, warts, acne, eczema or change in skin lesion Neuro: No weakness, tremor, incoordination, spasms, paresthesia or pain Psychiatric: Denies confusion, memory loss or sensory loss. Denies Depression. Endocrine: Denies change in weight, skin, hair change, nocturia, and paresthesia, diabetic polys, visual blurring or hyper / hypo glycemic episodes.  Heme/Lymph: No excessive bleeding, bruising or enlarged lymph nodes.  Physical Exam  BP 114/80   Pulse (!) 52   Temp (!) 97.5 F (36.4 C)   Resp 16   Ht 6' 1.5" (1.867 m)   Wt 222 lb 6.4 oz (100.9 kg)   BMI 28.94 kg/m   General Appearance: Well nourished and well groomed and in no apparent distress.  Eyes: PERRLA, EOMs, conjunctiva  no swelling or erythema, normal fundi and vessels. Sinuses: No frontal/maxillary tenderness ENT/Mouth: EACs patent / TMs  nl. Nares clear without erythema, swelling, mucoid exudates. Oral hygiene is good. No erythema, swelling, or exudate. Tongue normal, non-obstructing. Tonsils not swollen or erythematous. Hearing normal.  Neck: Supple, thyroid not palpable. No bruits, nodes or JVD. Respiratory: Respiratory effort normal.  BS equal and clear bilateral without rales, rhonci, wheezing or stridor. Cardio: Heart sounds are normal with regular rate and rhythm and no murmurs, rubs or gallops. Peripheral pulses are normal and equal bilaterally without edema. No aortic or femoral bruits. Chest: symmetric with normal excursions and percussion.  Abdomen: Soft, with Nl bowel sounds. Nontender, no guarding, rebound, hernias, masses, or organomegaly.  Lymphatics: Non tender without lymphadenopathy.  Genitourinary: No hernias.Testes nl. DRE - prostate nl for age - smooth & firm w/o nodules. Musculoskeletal: Full ROM all peripheral extremities, joint stability, 5/5 strength, and normal gait. Skin: Warm and dry without rashes, lesions, cyanosis, clubbing or  ecchymosis.  Neuro: Cranial nerves intact, reflexes equal bilaterally. Normal muscle tone, no  cerebellar symptoms. Sensation intact.  Pysch: Alert and oriented X 3 with normal affect, insight and judgment appropriate.   Assessment and Plan  1. Annual Preventative/Screening Exam   2. Essential hypertension  - EKG 12-Lead - Urinalysis, Routine w reflex microscopic - Microalbumin / creatinine urine ratio - CBC with Differential/Platelet - COMPLETE METABOLIC PANEL WITH GFR - Magnesium - TSH  3. Hyperlipidemia, mixed  - EKG 12-Lead - Lipid panel - TSH  4. Abnormal glucose  - EKG 12-Lead - Hemoglobin A1c - Insulin, random  5. Vitamin D deficiency  - VITAMIN D 25 Hydroxyl  6. Gastroesophageal reflux disease,  - CBC with  Differential/Platelet  7. Screening for colorectal cancer  - POC Hemoccult Bld/Stl  8. Screening for ischemic heart disease  - EKG 12-Lead  9. FH: hypertension  - EKG 12-Lead  10. Former smoker  - EKG 12-Lead  11. Fatigue, unspecified type  - Iron,Total/Total Iron Binding Cap - Vitamin B12 - Testosterone - CBC with Differential/Platelet - TSH  12. Medication management  - Urinalysis, Routine w reflex microscopic - Microalbumin / creatinine urine ratio - CBC with Differential/Platelet - COMPLETE METABOLIC PANEL WITH GFR - Magnesium - Lipid panel - TSH - Hemoglobin A1c - Insulin, random - VITAMIN D 25 Hydroxy         Patient was counseled in prudent diet, weight control to achieve/maintain BMI less than 25, BP monitoring, regular exercise and medications as discussed.  Discussed med effects and SE's. Routine screening labs and tests as requested with regular follow-up as recommended. Over 40 minutes of exam, counseling, chart review and high complex critical decision making was performed

## 2017-11-04 LAB — CBC WITH DIFFERENTIAL/PLATELET
BASOS PCT: 0.7 %
Basophils Absolute: 29 cells/uL (ref 0–200)
Eosinophils Absolute: 82 cells/uL (ref 15–500)
Eosinophils Relative: 2 %
HEMATOCRIT: 47.5 % (ref 38.5–50.0)
Hemoglobin: 16 g/dL (ref 13.2–17.1)
LYMPHS ABS: 1062 {cells}/uL (ref 850–3900)
MCH: 28.8 pg (ref 27.0–33.0)
MCHC: 33.7 g/dL (ref 32.0–36.0)
MCV: 85.6 fL (ref 80.0–100.0)
MPV: 10.4 fL (ref 7.5–12.5)
Monocytes Relative: 11.7 %
NEUTROS PCT: 59.7 %
Neutro Abs: 2448 cells/uL (ref 1500–7800)
Platelets: 202 10*3/uL (ref 140–400)
RBC: 5.55 10*6/uL (ref 4.20–5.80)
RDW: 11.9 % (ref 11.0–15.0)
Total Lymphocyte: 25.9 %
WBC: 4.1 10*3/uL (ref 3.8–10.8)
WBCMIX: 480 {cells}/uL (ref 200–950)

## 2017-11-04 LAB — LIPID PANEL
CHOL/HDL RATIO: 3.8 (calc) (ref <5.0)
CHOLESTEROL: 168 mg/dL (ref <200)
HDL: 44 mg/dL (ref >40)
LDL Cholesterol (Calc): 108 mg/dL (calc) — ABNORMAL HIGH
Non-HDL Cholesterol (Calc): 124 mg/dL (calc) (ref <130)
Triglycerides: 71 mg/dL (ref <150)

## 2017-11-04 LAB — MICROALBUMIN / CREATININE URINE RATIO
CREATININE, URINE: 59 mg/dL (ref 20–320)
Microalb Creat Ratio: 8 mcg/mg creat (ref <30)
Microalb, Ur: 0.5 mg/dL

## 2017-11-04 LAB — COMPLETE METABOLIC PANEL WITH GFR
AG RATIO: 1.8 (calc) (ref 1.0–2.5)
ALT: 14 U/L (ref 9–46)
AST: 25 U/L (ref 10–40)
Albumin: 4.7 g/dL (ref 3.6–5.1)
Alkaline phosphatase (APISO): 52 U/L (ref 40–115)
BUN: 11 mg/dL (ref 7–25)
CALCIUM: 10 mg/dL (ref 8.6–10.3)
CO2: 33 mmol/L — ABNORMAL HIGH (ref 20–32)
CREATININE: 0.78 mg/dL (ref 0.60–1.35)
Chloride: 100 mmol/L (ref 98–110)
GFR, EST AFRICAN AMERICAN: 132 mL/min/{1.73_m2} (ref >=60)
GFR, EST NON AFRICAN AMERICAN: 114 mL/min/{1.73_m2} (ref >=60)
Globulin: 2.6 g/dL (calc) (ref 1.9–3.7)
Glucose, Bld: 82 mg/dL (ref 65–99)
POTASSIUM: 4.7 mmol/L (ref 3.5–5.3)
Sodium: 138 mmol/L (ref 135–146)
TOTAL PROTEIN: 7.3 g/dL (ref 6.1–8.1)
Total Bilirubin: 0.6 mg/dL (ref 0.2–1.2)

## 2017-11-04 LAB — URINALYSIS, ROUTINE W REFLEX MICROSCOPIC
BILIRUBIN URINE: NEGATIVE
GLUCOSE, UA: NEGATIVE
HGB URINE DIPSTICK: NEGATIVE
KETONES UR: NEGATIVE
Leukocytes, UA: NEGATIVE
Nitrite: NEGATIVE
PROTEIN: NEGATIVE
Specific Gravity, Urine: 1.014 (ref 1.001–1.03)
pH: 7.5 (ref 5.0–8.0)

## 2017-11-04 LAB — MAGNESIUM: Magnesium: 1.9 mg/dL (ref 1.5–2.5)

## 2017-11-04 LAB — IRON, TOTAL/TOTAL IRON BINDING CAP
%SAT: 24 % (calc) (ref 20–48)
Iron: 74 ug/dL (ref 50–180)
TIBC: 310 mcg/dL (calc) (ref 250–425)

## 2017-11-04 LAB — TESTOSTERONE: TESTOSTERONE: 724 ng/dL (ref 250–827)

## 2017-11-04 LAB — VITAMIN B12: VITAMIN B 12: 450 pg/mL (ref 200–1100)

## 2017-11-04 LAB — INSULIN, RANDOM: INSULIN: 3 u[IU]/mL (ref 2.0–19.6)

## 2017-11-04 LAB — VITAMIN D 25 HYDROXY (VIT D DEFICIENCY, FRACTURES): Vit D, 25-Hydroxy: 62 ng/mL (ref 30–100)

## 2017-11-04 LAB — HEMOGLOBIN A1C
EAG (MMOL/L): 5.4 (calc)
Hgb A1c MFr Bld: 5 % of total Hgb (ref <5.7)
Mean Plasma Glucose: 97 (calc)

## 2017-11-04 LAB — TSH: TSH: 1 mIU/L (ref 0.40–4.50)

## 2017-11-07 ENCOUNTER — Encounter: Payer: Self-pay | Admitting: Internal Medicine

## 2017-11-15 DIAGNOSIS — J309 Allergic rhinitis, unspecified: Secondary | ICD-10-CM | POA: Diagnosis not present

## 2018-02-18 DIAGNOSIS — I10 Essential (primary) hypertension: Secondary | ICD-10-CM | POA: Diagnosis not present

## 2018-02-18 DIAGNOSIS — J069 Acute upper respiratory infection, unspecified: Secondary | ICD-10-CM | POA: Diagnosis not present

## 2018-02-21 DIAGNOSIS — J018 Other acute sinusitis: Secondary | ICD-10-CM | POA: Diagnosis not present

## 2018-03-04 ENCOUNTER — Other Ambulatory Visit: Payer: Self-pay | Admitting: Adult Health

## 2018-04-29 DIAGNOSIS — J0100 Acute maxillary sinusitis, unspecified: Secondary | ICD-10-CM | POA: Diagnosis not present

## 2018-05-04 NOTE — Progress Notes (Signed)
FOLLOW UP  Assessment and Plan:   Hypertension Well controlled with current medications  Monitor blood pressure at home; patient to call if consistently greater than 130/80 Continue DASH diet.   Reminder to go to the ER if any CP, SOB, nausea, dizziness, severe HA, changes vision/speech, left arm numbness and tingling and jaw pain.  Cholesterol Currently at goal by lifestyle only Continue low cholesterol diet and exercise.  Check lipid panel.   Other abnormal glucose Recent A1Cs at goal Discussed diet/exercise, weight management  Defer A1C; check BMP  Overweight Long discussion about weight loss, diet, and exercise Recommended diet heavy in fruits and veggies and low in animal meats, cheeses, and dairy products, appropriate calorie intake Discussed ideal weight for height and initial weight goal (200 lb) Patient will work on increasing exercise, continue to watch diet and aim for calorie deficit Will follow up in 3 months  Vitamin D Def At goal at last visit; continue supplementation to maintain goal of 70-100  Anxiety Well managed by current regimen; continue medications as needed Stress management techniques discussed, increase water, good sleep hygiene discussed, increase exercise, and increase veggies.   Continue diet and meds as discussed. Further disposition pending results of labs. Discussed med's effects and SE's.   Over 30 minutes of exam, counseling, chart review, and critical decision making was performed.   Future Appointments  Date Time Provider Department Center  11/18/2018  9:00 AM Lucky Cowboy, MD [facility] None    ----------------------------------------------------------------------------------------------------------------------  HPI 38 y.o. male  presents for 3 month follow up on hypertension, cholesterol, glucose management, weight, anxiety and vitamin D deficiency.   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 He works for [employer].   he has a diagnosis of anxiety and is currently on xanax AS needed, takes a few x a month.   BMI is Body mass index is 29.86 kg/m., he has been working on diet and exercise. Wt Readings from Last 3 Encounters:  05/05/18 229 lb 6.4 oz (104.1 kg)  11/03/17 222 lb 6.4 oz (100.9 kg)  04/21/17 224 lb 9.6 oz (101.9 kg)   His blood pressure has been controlled at home, today their BP is BP: 108/74  He does workout. He denies chest pain, shortness of breath, dizziness.   He is not on cholesterol medication and denies myalgias. His cholesterol is at goal. The cholesterol last visit was:   Lab Results  Component Value Date   CHOL 168 11/03/2017   HDL 44 11/03/2017   LDLCALC 108 (H) 11/03/2017   TRIG 71 11/03/2017   CHOLHDL 3.8 11/03/2017    He has been working on diet and exercise for glucose management, and denies foot ulcerations, increased appetite, nausea, paresthesia of the feet, polydipsia, polyuria, visual disturbances, vomiting and weight loss. Last A1C in the office was:  Lab Results  Component Value Date   HGBA1C 5.0 11/03/2017   Patient is on Vitamin D supplement and at goal at recent check:   Lab Results  Component Value Date   VD25OH 62 11/03/2017        Current Medications:  Current Outpatient Medications on File Prior to Visit  Medication Sig  . ALPRAZolam (XANAX) 1 MG tablet Take 1/2 to 1 tablet 2 to 3 x / day ONLY if needed for Anxiety Attack & please try to limit to 5 days /week to avoid addiction  . bisoprolol (ZEBETA) 5 MG tablet TAKE  1 TABLET BY MOUTH ONCE DAILY  . multivitamin (ONE-A-DAY MEN'S) TABS tablet Take 1 tablet by mouth daily.  . psyllium (METAMUCIL) 58.6 % packet Take 1 packet by mouth daily.  . ranitidine (ZANTAC) 150 MG capsule Take 150 mg by mouth 2 (two) times daily. Reported on 08/21/2015   No current facility-administered medications on file prior to visit.      Allergies: No Known  Allergies   Medical History:  Past Medical History:  Diagnosis Date  . Alcoholic pancreatitis   . Dental crowns present   . GERD (gastroesophageal reflux disease)   . History of pancreatitis 08/2015  . Hypertension    states under control with med., has been on med. x 4 yr.  . Medial meniscus tear 01/2016   right knee   Family history- Reviewed and unchanged Social history- Reviewed and unchanged   Review of Systems:  Review of Systems  Constitutional: Negative for malaise/fatigue and weight loss.  HENT: Negative for hearing loss and tinnitus.   Eyes: Negative for blurred vision and double vision.  Respiratory: Negative for cough, shortness of breath and wheezing.   Cardiovascular: Negative for chest pain, palpitations, orthopnea, claudication and leg swelling.  Gastrointestinal: Negative for abdominal pain, blood in stool, constipation, diarrhea, heartburn, melena, nausea and vomiting.  Genitourinary: Negative.   Musculoskeletal: Negative for joint pain and myalgias.  Skin: Negative for rash.  Neurological: Negative for dizziness, tingling, sensory change, weakness and headaches.  Endo/Heme/Allergies: Negative for polydipsia.  Psychiatric/Behavioral: Negative.   All other systems reviewed and are negative.     Physical Exam: BP 108/74   Pulse (!) 54   Temp 97.7 F (36.5 C)   Ht 6' 1.5" (1.867 m)   Wt 229 lb 6.4 oz (104.1 kg)   SpO2 99%   BMI 29.86 kg/m  Wt Readings from Last 3 Encounters:  05/05/18 229 lb 6.4 oz (104.1 kg)  11/03/17 222 lb 6.4 oz (100.9 kg)  04/21/17 224 lb 9.6 oz (101.9 kg)   General Appearance: Well nourished, in no apparent distress. Eyes: PERRLA, EOMs, conjunctiva no swelling or erythema Sinuses: No Frontal/maxillary tenderness ENT/Mouth: Ext aud canals clear, TMs without erythema, bulging. No erythema, swelling, or exudate on post pharynx.  Tonsils not swollen or erythematous. Hearing normal.  Neck: Supple, thyroid normal.  Respiratory:  Respiratory effort normal, BS equal bilaterally without rales, rhonchi, wheezing or stridor.  Cardio: RRR with no MRGs. Brisk peripheral pulses without edema.  Abdomen: Soft, + BS.  Non tender, no guarding, rebound, hernias, masses. Lymphatics: Non tender without lymphadenopathy.  Musculoskeletal: Full ROM, 5/5 strength, Normal gait Skin: Warm, dry without rashes, lesions, ecchymosis.  Neuro: Cranial nerves intact. No cerebellar symptoms.  Psych: Awake and oriented X 3, normal affect, Insight and Judgment appropriate.    Quentin Mulling, PA-C 8:55 AM [facility]

## 2018-05-05 ENCOUNTER — Ambulatory Visit: Payer: BLUE CROSS/BLUE SHIELD | Admitting: Physician Assistant

## 2018-05-05 ENCOUNTER — Encounter: Payer: Self-pay | Admitting: Physician Assistant

## 2018-05-05 ENCOUNTER — Other Ambulatory Visit: Payer: Self-pay

## 2018-05-05 VITALS — BP 108/74 | HR 54 | Temp 97.7°F | Ht 73.5 in | Wt 229.4 lb

## 2018-05-05 DIAGNOSIS — E559 Vitamin D deficiency, unspecified: Secondary | ICD-10-CM | POA: Diagnosis not present

## 2018-05-05 DIAGNOSIS — I10 Essential (primary) hypertension: Secondary | ICD-10-CM

## 2018-05-05 DIAGNOSIS — Z79899 Other long term (current) drug therapy: Secondary | ICD-10-CM

## 2018-05-05 DIAGNOSIS — E782 Mixed hyperlipidemia: Secondary | ICD-10-CM | POA: Diagnosis not present

## 2018-05-05 DIAGNOSIS — R7309 Other abnormal glucose: Secondary | ICD-10-CM | POA: Diagnosis not present

## 2018-05-05 NOTE — Patient Instructions (Signed)
WATER IS IMPORTANT  Being dehydrated can hurt your kidneys, cause fatigue, headaches, muscle aches, joint pain, and dry skin/nails so please increase your fluids.   Drink 80-100 oz a day of water, measure it out! Eat 3 meals a day, have to do breakfast, eat protein- hard boiled eggs, protein bar like nature valley protein bar, greek yogurt like oikos triple zero, chobani 100, or light n fit greek  Can check out plantnanny app on your phone to help you keep track of your water    Please monitor your blood pressure. If it is getting below 130/80 AND you are having fatigue with exertion, dizziness we may need to cut your blood pressure medication in half. Please call the office if this is happening. Hypotension As your heart beats, it forces blood through your body. This force is called blood pressure. If you have hypotension, you have low blood pressure. When your blood pressure is too low, you may not get enough blood to your brain. You may feel weak, feel lightheaded, have a fast heartbeat, or even pass out (faint). HOME CARE  Drink enough fluids to keep your pee (urine) clear or pale yellow.  Take all medicines as told by your doctor.  Get up slowly after sitting or lying down.  Wear support stockings as told by your doctor.  Maintain a healthy diet by including foods such as fruits, vegetables, nuts, whole grains, and lean meats. GET HELP IF:  You are throwing up (vomiting) or have watery poop (diarrhea).  You have a fever for more than 2-3 days.  You feel more thirsty than usual.  You feel weak and tired. GET HELP RIGHT AWAY IF:   You pass out (faint).  You have chest pain or a fast or irregular heartbeat.  You lose feeling in part of your body.  You cannot move your arms or legs.  You have trouble speaking.  You get sweaty or feel lightheaded. MAKE SURE YOU:   Understand these instructions.  Will watch your condition.  Will get help right away if you are not  doing well or get worse. Document Released: 04/23/2009 Document Revised: 09/29/2012 Document Reviewed: 07/30/2012 [facility] Patient Information 2015 [place], [place]. This information is not intended to replace advice given to you by your health care provider. Make sure you discuss any questions you have with your health care provider.

## 2018-05-06 LAB — CBC WITH DIFFERENTIAL/PLATELET
Absolute Monocytes: 515 cells/uL (ref 200–950)
BASOS ABS: 41 {cells}/uL (ref 0–200)
Basophils Relative: 0.9 %
EOS ABS: 120 {cells}/uL (ref 15–500)
EOS PCT: 2.6 %
HEMATOCRIT: 46.7 % (ref 38.5–50.0)
HEMOGLOBIN: 15.9 g/dL (ref 13.2–17.1)
LYMPHS ABS: 1150 {cells}/uL (ref 850–3900)
MCH: 29.2 pg (ref 27.0–33.0)
MCHC: 34 g/dL (ref 32.0–36.0)
MCV: 85.7 fL (ref 80.0–100.0)
MPV: 10.3 fL (ref 7.5–12.5)
Monocytes Relative: 11.2 %
NEUTROS ABS: 2774 {cells}/uL (ref 1500–7800)
Neutrophils Relative %: 60.3 %
Platelets: 216 10*3/uL (ref 140–400)
RBC: 5.45 10*6/uL (ref 4.20–5.80)
RDW: 12.2 % (ref 11.0–15.0)
Total Lymphocyte: 25 %
WBC: 4.6 10*3/uL (ref 3.8–10.8)

## 2018-05-06 LAB — COMPLETE METABOLIC PANEL WITH GFR
AG Ratio: 1.8 (calc) (ref 1.0–2.5)
ALBUMIN MSPROF: 4.8 g/dL (ref 3.6–5.1)
ALKALINE PHOSPHATASE (APISO): 56 U/L (ref 36–130)
ALT: 17 U/L (ref 9–46)
AST: 22 U/L (ref 10–40)
BUN: 10 mg/dL (ref 7–25)
CO2: 31 mmol/L (ref 20–32)
CREATININE: 0.88 mg/dL (ref 0.60–1.35)
Calcium: 9.9 mg/dL (ref 8.6–10.3)
Chloride: 99 mmol/L (ref 98–110)
GFR, Est African American: 125 mL/min/{1.73_m2} (ref >=60)
GFR, Est Non African American: 108 mL/min/{1.73_m2} (ref >=60)
GLOBULIN: 2.7 g/dL (ref 1.9–3.7)
Glucose, Bld: 74 mg/dL (ref 65–99)
Potassium: 4.6 mmol/L (ref 3.5–5.3)
SODIUM: 137 mmol/L (ref 135–146)
Total Bilirubin: 0.5 mg/dL (ref 0.2–1.2)
Total Protein: 7.5 g/dL (ref 6.1–8.1)

## 2018-05-06 LAB — LIPID PANEL
CHOL/HDL RATIO: 4.3 (calc) (ref <5.0)
Cholesterol: 164 mg/dL (ref <200)
HDL: 38 mg/dL — ABNORMAL LOW (ref >=40)
LDL CHOLESTEROL (CALC): 110 mg/dL — AB
Non-HDL Cholesterol (Calc): 126 mg/dL (calc) (ref <130)
Triglycerides: 69 mg/dL (ref <150)

## 2018-05-06 LAB — MAGNESIUM: Magnesium: 1.9 mg/dL (ref 1.5–2.5)

## 2018-05-06 LAB — TSH: TSH: 1.17 m[IU]/L (ref 0.40–4.50)

## 2018-05-06 LAB — HEMOGLOBIN A1C
Hgb A1c MFr Bld: 5.1 % of total Hgb (ref <5.7)
Mean Plasma Glucose: 100 (calc)
eAG (mmol/L): 5.5 (calc)

## 2018-05-06 LAB — VITAMIN D 25 HYDROXY (VIT D DEFICIENCY, FRACTURES): Vit D, 25-Hydroxy: 43 ng/mL (ref 30–100)

## 2018-07-23 ENCOUNTER — Other Ambulatory Visit: Payer: Self-pay | Admitting: Adult Health

## 2018-11-17 NOTE — Progress Notes (Signed)
Annual  Screening/Preventative Visit  & Comprehensive Evaluation & Examination     This very nice 38 y.o. MWM presents for a Screening /Preventative Visit & comprehensive evaluation and management of multiple medical co-morbidities.  Patient has been followed for HTN, HLD, Prediabetes and Vitamin D Deficiency. Patient has GERD controlled on his meds.     HTN predates circa 2011. Patient's BP has been controlled at home.  Today's BP: 112/80. Patient denies any cardiac symptoms as chest pain, palpitations, shortness of breath, dizziness or ankle swelling.     Patient's hyperlipidemia is controlled with diet and medications. Patient denies myalgias or other medication SE's. Last lipids were not at goal:  Lab Results  Component Value Date   CHOL 164 05/05/2018   HDL 38 (L) 05/05/2018   LDLCALC 110 (H) 05/05/2018   TRIG 69 05/05/2018   CHOLHDL 4.3 05/05/2018      Patient hx/o morbid obesity , fortunately lost weight from his peak of 280# down to his current wt of 229# and has been expectantly monitored for glucose intolerance. Patient denies reactive hypoglycemic symptoms, visual blurring, diabetic polys or paresthesias. Last A1c was Normal & at goal: Lab Results  Component Value Date   HGBA1C 5.1 05/05/2018       Finally, patient has history of Vitamin D Deficiency and last vitamin D was still slightly low :  Lab Results  Component Value Date   VD25OH 543 05/05/2018   Current Outpatient Medications on File Prior to Visit  Medication Sig  . ALPRAZolam (XANAX) 1 MG tablet Take 1/2 to 1 tablet 2 to 3 x / day ONLY if needed for Anxiety Attack & please try to limit to 5 days /week to avoid addiction  . bisoprolol (ZEBETA) 5 MG tablet Take 1 tablet by mouth once daily  . multivitamin (ONE-A-DAY MEN'S) TABS tablet Take 1 tablet by mouth daily.  . psyllium (METAMUCIL) 58.6 % packet Take 1 packet by mouth daily.   No current facility-administered medications on file prior to visit.    No  Known Allergies   Past Medical History:  Diagnosis Date  . Alcoholic pancreatitis   . Dental crowns present   . GERD (gastroesophageal reflux disease)   . History of pancreatitis 08/2015  . Hypertension    states under control with med., has been on med. x 4 yr.  . Medial meniscus tear 01/2016   right knee   Health Maintenance  Topic Date Due  . TETANUS/TDAP  03/11/2025  . HIV Screening  Completed  . INFLUENZA VACCINE  Discontinued   Immunization History  Administered Date(s) Administered  . PPD Test 09/02/2013, 09/06/2014, 09/24/2015, 10/22/2016, 11/03/2017  . Pneumococcal-Unspecified 11/21/2009  . Tdap 11/21/2009, 03/12/2015   Past Surgical History:  Procedure Laterality Date  . ANTERIOR CRUCIATE LIGAMENT REPAIR     bilateral  . KNEE ARTHROSCOPY WITH MEDIAL MENISECTOMY Right 01/28/2016   Procedure: KNEE ARTHROSCOPY WITH PARTIAL MEDIAL MENISCECTOMY;  Surgeon: Jones BroomJustin Chandler, MD;  Location: [facility];  Service: Orthopedics;  Laterality: Right;  KNEE ARTHROSCOPY WITH PARTIAL MEDIAL MENISECTOMY  . SHOULDER ARTHROSCOPY W/ ROTATOR CUFF REPAIR Right 02/2015   Family History  Problem Relation Age of Onset  . Hypertension Father   . Hyperlipidemia Father    Social History   Socioeconomic History  . Marital status: Married x11years    Spouse name: Ignacia Marvelristia  . Number of children: 1 daughter 429 yo  Occupational History  . Heavy Equipment Truck Driver- [employer]  Tobacco Use  .  Smoking status: Former Smoker    Packs/day: 0.00    Years: 0.00    Pack years: 0.00    Quit date: 02/09/2005    Years since quitting: 13.7  . Smokeless tobacco: Never Used  Substance and Sexual Activity  . Alcohol use: No  . Drug use: No  . Sexual activity: Not on file    ROS Constitutional: Denies fever, chills, weight loss/gain, headaches, insomnia,  night sweats or change in appetite. Does c/o fatigue. Eyes: Denies redness, blurred vision, diplopia, discharge, itchy or  watery eyes.  ENT: Denies discharge, congestion, post nasal drip, epistaxis, sore throat, earache, hearing loss, dental pain, Tinnitus, Vertigo, Sinus pain or snoring.  Cardio: Denies chest pain, palpitations, irregular heartbeat, syncope, dyspnea, diaphoresis, orthopnea, PND, claudication or edema Respiratory: denies cough, dyspnea, DOE, pleurisy, hoarseness, laryngitis or wheezing.  Gastrointestinal: Denies dysphagia, heartburn, reflux, water brash, pain, cramps, nausea, vomiting, bloating, diarrhea, constipation, hematemesis, melena, hematochezia, jaundice or hemorrhoids Genitourinary: Denies dysuria, frequency, urgency, nocturia, hesitancy, discharge, hematuria or flank pain Musculoskeletal: Denies arthralgia, myalgia, stiffness, Jt. Swelling, pain, limp or strain/sprain. Denies Falls. Skin: Denies puritis, rash, hives, warts, acne, eczema or change in skin lesion Neuro: No weakness, tremor, incoordination, spasms, paresthesia or pain Psychiatric: Denies confusion, memory loss or sensory loss. Denies Depression. Endocrine: Denies change in weight, skin, hair change, nocturia, and paresthesia, diabetic polys, visual blurring or hyper / hypo glycemic episodes.  Heme/Lymph: No excessive bleeding, bruising or enlarged lymph nodes.  Physical Exam  BP 112/80   Pulse (!) 56   Temp (!) 97.2 F (36.2 C)   Resp 16   Ht 6' 1.5" (1.867 m)   Wt 229 lb 3.2 oz (104 kg)   BMI 29.83 kg/m   General Appearance: Well nourished and well groomed and in no apparent distress.  Eyes: PERRLA, EOMs, conjunctiva no swelling or erythema, normal fundi and vessels. Sinuses: No frontal/maxillary tenderness ENT/Mouth: EACs patent / TMs  nl. Nares clear without erythema, swelling, mucoid exudates. Oral hygiene is good. No erythema, swelling, or exudate. Tongue normal, non-obstructing. Tonsils not swollen or erythematous. Hearing normal.  Neck: Supple, thyroid not palpable. No bruits, nodes or JVD. Respiratory:  Respiratory effort normal.  BS equal and clear bilateral without rales, rhonci, wheezing or stridor. Cardio: Heart sounds are normal with regular rate and rhythm and no murmurs, rubs or gallops. Peripheral pulses are normal and equal bilaterally without edema. No aortic or femoral bruits. Chest: symmetric with normal excursions and percussion.  Abdomen: Soft, with Nl bowel sounds. Nontender, no guarding, rebound, hernias, masses, or organomegaly.  Lymphatics: Non tender without lymphadenopathy.  Musculoskeletal: Full ROM all peripheral extremities, joint stability, 5/5 strength, and normal gait. Skin: Warm and dry without rashes, lesions, cyanosis, clubbing or  ecchymosis.  Neuro: Cranial nerves intact, reflexes equal bilaterally. Normal muscle tone, no cerebellar symptoms. Sensation intact.  Pysch: Alert and oriented X 3 with normal affect, insight and judgment appropriate.   Assessment and Plan  1. Annual Preventative/Screening Exam   2. Essential hypertension  - EKG 12-Lead - Urinalysis, Routine w reflex microscopic - Microalbumin / creatinine urine ratio - CBC with Differential/Platelet - COMPLETE METABOLIC PANEL WITH GFR - Magnesium - TSH  3. Hyperlipidemia, mixed  - EKG 12-Lead - Lipid panel - TSH  4. Abnormal glucose  - EKG 12-Lead - Hemoglobin A1c - Insulin, random  5. Vitamin D deficiency  - VITAMIN D 25 Hydroxyl  6. Screening examination for pulmonary tuberculosis  - TB Skin Test  7. Screening  for colorectal cancer  - POC Hemoccult Bld/Stl  8. Screening for ischemic heart disease  - EKG 12-Lead  9. FH: hypertension  - EKG 12-Lead  10. Former smoker  - EKG 12-Lead  11. Prostate cancer screening  - PSA  12. Fatigue  - Iron,Total/Total Iron Binding Cap - Vitamin B12 - Testosterone - CBC with Differential/Platelet - TSH  13. Medication management  - Urinalysis, Routine w reflex microscopic - Microalbumin / creatinine urine ratio - CBC  with Differential/Platelet - COMPLETE METABOLIC PANEL WITH GFR - Magnesium - Lipid panel - TSH - Hemoglobin A1c - Insulin, random - VITAMIN D 25 Hydroxyl        Patient was counseled in prudent diet, weight control to achieve/maintain BMI less than 25, BP monitoring, regular exercise and medications as discussed.  Discussed med effects and SE's. Routine screening labs and tests as requested with regular follow-up as recommended. Over 40 minutes of exam, counseling, chart review and high complex critical decision making was performed   Marinus Maw, MD

## 2018-11-17 NOTE — Patient Instructions (Signed)

## 2018-11-18 ENCOUNTER — Encounter: Payer: Self-pay | Admitting: Internal Medicine

## 2018-11-18 ENCOUNTER — Other Ambulatory Visit: Payer: Self-pay

## 2018-11-18 ENCOUNTER — Ambulatory Visit: Payer: BC Managed Care – PPO | Admitting: Internal Medicine

## 2018-11-18 VITALS — BP 112/80 | HR 56 | Temp 97.2°F | Resp 16 | Ht 73.5 in | Wt 229.2 lb

## 2018-11-18 DIAGNOSIS — R350 Frequency of micturition: Secondary | ICD-10-CM

## 2018-11-18 DIAGNOSIS — Z131 Encounter for screening for diabetes mellitus: Secondary | ICD-10-CM

## 2018-11-18 DIAGNOSIS — E559 Vitamin D deficiency, unspecified: Secondary | ICD-10-CM | POA: Diagnosis not present

## 2018-11-18 DIAGNOSIS — Z87891 Personal history of nicotine dependence: Secondary | ICD-10-CM

## 2018-11-18 DIAGNOSIS — R5383 Other fatigue: Secondary | ICD-10-CM

## 2018-11-18 DIAGNOSIS — N401 Enlarged prostate with lower urinary tract symptoms: Secondary | ICD-10-CM

## 2018-11-18 DIAGNOSIS — I10 Essential (primary) hypertension: Secondary | ICD-10-CM

## 2018-11-18 DIAGNOSIS — Z13220 Encounter for screening for lipoid disorders: Secondary | ICD-10-CM

## 2018-11-18 DIAGNOSIS — Z0000 Encounter for general adult medical examination without abnormal findings: Secondary | ICD-10-CM | POA: Diagnosis not present

## 2018-11-18 DIAGNOSIS — Z111 Encounter for screening for respiratory tuberculosis: Secondary | ICD-10-CM

## 2018-11-18 DIAGNOSIS — Z79899 Other long term (current) drug therapy: Secondary | ICD-10-CM

## 2018-11-18 DIAGNOSIS — Z130 Encounter for screening for diseases of the blood and blood-forming organs and certain disorders involving the immune mechanism: Secondary | ICD-10-CM

## 2018-11-18 DIAGNOSIS — Z0001 Encounter for general adult medical examination with abnormal findings: Secondary | ICD-10-CM

## 2018-11-18 DIAGNOSIS — Z136 Encounter for screening for cardiovascular disorders: Secondary | ICD-10-CM | POA: Diagnosis not present

## 2018-11-18 DIAGNOSIS — Z1211 Encounter for screening for malignant neoplasm of colon: Secondary | ICD-10-CM

## 2018-11-18 DIAGNOSIS — Z125 Encounter for screening for malignant neoplasm of prostate: Secondary | ICD-10-CM

## 2018-11-18 DIAGNOSIS — Z1389 Encounter for screening for other disorder: Secondary | ICD-10-CM

## 2018-11-18 DIAGNOSIS — Z1329 Encounter for screening for other suspected endocrine disorder: Secondary | ICD-10-CM

## 2018-11-18 DIAGNOSIS — Z8249 Family history of ischemic heart disease and other diseases of the circulatory system: Secondary | ICD-10-CM | POA: Diagnosis not present

## 2018-11-18 DIAGNOSIS — E782 Mixed hyperlipidemia: Secondary | ICD-10-CM

## 2018-11-18 DIAGNOSIS — R7309 Other abnormal glucose: Secondary | ICD-10-CM

## 2018-11-19 LAB — MAGNESIUM: Magnesium: 1.9 mg/dL (ref 1.5–2.5)

## 2018-11-19 LAB — VITAMIN D 25 HYDROXY (VIT D DEFICIENCY, FRACTURES): Vit D, 25-Hydroxy: 45 ng/mL (ref 30–100)

## 2018-11-19 LAB — COMPLETE METABOLIC PANEL WITHOUT GFR
AG Ratio: 1.6 (calc) (ref 1.0–2.5)
ALT: 16 U/L (ref 9–46)
AST: 25 U/L (ref 10–40)
Albumin: 4.7 g/dL (ref 3.6–5.1)
Alkaline phosphatase (APISO): 53 U/L (ref 36–130)
BUN: 11 mg/dL (ref 7–25)
CO2: 33 mmol/L — ABNORMAL HIGH (ref 20–32)
Calcium: 9.8 mg/dL (ref 8.6–10.3)
Chloride: 100 mmol/L (ref 98–110)
Creat: 0.94 mg/dL (ref 0.60–1.35)
GFR, Est African American: 117 mL/min/1.73m2
GFR, Est Non African American: 101 mL/min/1.73m2
Globulin: 3 g/dL (ref 1.9–3.7)
Glucose, Bld: 86 mg/dL (ref 65–99)
Potassium: 4.7 mmol/L (ref 3.5–5.3)
Sodium: 139 mmol/L (ref 135–146)
Total Bilirubin: 0.5 mg/dL (ref 0.2–1.2)
Total Protein: 7.7 g/dL (ref 6.1–8.1)

## 2018-11-19 LAB — CBC WITH DIFFERENTIAL/PLATELET
Absolute Monocytes: 414 cells/uL (ref 200–950)
Basophils Absolute: 41 cells/uL (ref 0–200)
Basophils Relative: 1.1 %
Eosinophils Absolute: 130 cells/uL (ref 15–500)
Eosinophils Relative: 3.5 %
HCT: 47.6 % (ref 38.5–50.0)
Hemoglobin: 16.3 g/dL (ref 13.2–17.1)
Lymphs Abs: 1147 cells/uL (ref 850–3900)
MCH: 29.9 pg (ref 27.0–33.0)
MCHC: 34.2 g/dL (ref 32.0–36.0)
MCV: 87.2 fL (ref 80.0–100.0)
MPV: 10.4 fL (ref 7.5–12.5)
Monocytes Relative: 11.2 %
Neutro Abs: 1968 cells/uL (ref 1500–7800)
Neutrophils Relative %: 53.2 %
Platelets: 197 10*3/uL (ref 140–400)
RBC: 5.46 10*6/uL (ref 4.20–5.80)
RDW: 12.7 % (ref 11.0–15.0)
Total Lymphocyte: 31 %
WBC: 3.7 10*3/uL — ABNORMAL LOW (ref 3.8–10.8)

## 2018-11-19 LAB — URINALYSIS, ROUTINE W REFLEX MICROSCOPIC
Bilirubin Urine: NEGATIVE
Glucose, UA: NEGATIVE
Hgb urine dipstick: NEGATIVE
Ketones, ur: NEGATIVE
Leukocytes,Ua: NEGATIVE
Nitrite: NEGATIVE
Protein, ur: NEGATIVE
Specific Gravity, Urine: 1.008 (ref 1.001–1.03)
pH: 7 (ref 5.0–8.0)

## 2018-11-19 LAB — HEMOGLOBIN A1C
Hgb A1c MFr Bld: 5 %{Hb}
Mean Plasma Glucose: 97 (calc)
eAG (mmol/L): 5.4 (calc)

## 2018-11-19 LAB — LIPID PANEL
Cholesterol: 168 mg/dL (ref <200)
HDL: 46 mg/dL (ref >=40)
LDL Cholesterol (Calc): 107 mg/dL (calc) — ABNORMAL HIGH
Non-HDL Cholesterol (Calc): 122 mg/dL (calc) (ref <130)
Total CHOL/HDL Ratio: 3.7 (calc) (ref <5.0)
Triglycerides: 65 mg/dL (ref <150)

## 2018-11-19 LAB — MICROALBUMIN / CREATININE URINE RATIO
Creatinine, Urine: 32 mg/dL (ref 20–320)
Microalb Creat Ratio: 6 mcg/mg creat (ref <30)
Microalb, Ur: 0.2 mg/dL

## 2018-11-19 LAB — TSH: TSH: 1.18 m[IU]/L (ref 0.40–4.50)

## 2018-11-19 LAB — VITAMIN B12: Vitamin B-12: 604 pg/mL (ref 200–1100)

## 2018-11-19 LAB — IRON, TOTAL/TOTAL IRON BINDING CAP
Iron: 102 ug/dL (ref 50–180)
TIBC: 335 mcg/dL (calc) (ref 250–425)

## 2018-11-19 LAB — TESTOSTERONE: Testosterone: 723 ng/dL (ref 250–827)

## 2018-11-19 LAB — PSA: PSA: 0.5 ng/mL

## 2018-11-19 LAB — INSULIN, RANDOM: Insulin: 2 u[IU]/mL

## 2018-11-19 LAB — IRON,?TOTAL/TOTAL IRON BINDING CAP: %SAT: 30 % (calc) (ref 20–48)

## 2018-11-20 ENCOUNTER — Encounter: Payer: Self-pay | Admitting: Internal Medicine

## 2019-04-18 ENCOUNTER — Other Ambulatory Visit: Payer: Self-pay | Admitting: Adult Health

## 2019-05-19 ENCOUNTER — Encounter: Payer: Self-pay | Admitting: Adult Health

## 2019-05-19 NOTE — Progress Notes (Signed)
FOLLOW UP  Assessment and Plan:   Hypertension Well controlled with current medications  Monitor blood pressure at home; patient to call if consistently greater than 130/80 Continue DASH diet.   Reminder to go to the ER if any CP, SOB, nausea, dizziness, severe HA, changes vision/speech, left arm numbness and tingling and jaw pain.  Cholesterol Currently with mild elevations managed by lifestyle only Continue low cholesterol diet and exercise.  Check lipid panel.   Other abnormal glucose Recent A1Cs at goal Discussed diet/exercise, weight management  Defer A1C; check CMP  Overweight Long discussion about weight loss, diet, and exercise Recommended diet heavy in fruits and veggies and low in animal meats, cheeses, and dairy products, appropriate calorie intake Discussed ideal weight for height and initial weight goal (2150 lb) Patient will work on increasing exercise, continue to watch diet and aim for calorie deficit Will follow up in 3 months  Vitamin D Def Below goal at last visit; he did increase dose to 5000 IU continue supplementation to maintain goal of 60-100 Check vitamin D  Anxiety He is off of medications and reports doing well; takes melatonin PRN sleep  Stress management techniques discussed, increase water, good sleep hygiene discussed, increase exercise, and increase veggies.   Continue diet and meds as discussed. Further disposition pending results of labs. Discussed med's effects and SE's.   Over 30 minutes of exam, counseling, chart review, and critical decision making was performed.   Future Appointments  Date Time Provider [place]  12/13/2019 10:00 AM Unk Pinto, MD [facility] None    ----------------------------------------------------------------------------------------------------------------------  HPI 39 y.o. male  presents for 6 month follow up on hypertension, cholesterol, glucose management, weight, anxiety and vitamin D  deficiency.   Daughters are in independent daycare, 3 and 78 months old, wife has new job. He works for [employer].   He endorses relflux sx occasionally after eating too quickly. <1x/week.   he has a diagnosis of anxiety and is currently on xanax AS needed, takes a few x a month, trying to stop completely.   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  Weight is down from peak of 315 lb in 2010.  Hx of alcoholic pancreatitis, minimal intake, 1 on the weekend Wt Readings from Last 3 Encounters:  05/23/19 234 lb 6.4 oz (106.3 kg)  11/18/18 229 lb 3.2 oz (104 kg)  05/05/18 229 lb 6.4 oz (104.1 kg)   He admits doesn't check BP at home, has cuff, today their BP is BP: 126/90  He does workout. He denies chest pain, shortness of breath, dizziness.   He is not on cholesterol medication and denies myalgias. His cholesterol is not at goal. The cholesterol last visit was:   Lab Results  Component Value Date   CHOL 168 11/18/2018   HDL 46 11/18/2018   LDLCALC 107 (H) 11/18/2018   TRIG 65 11/18/2018   CHOLHDL 3.7 11/18/2018    He has been working on diet and exercise for glucose management, and denies foot ulcerations, increased appetite, nausea, paresthesia of the feet, polydipsia, polyuria, visual disturbances, vomiting and weight loss. Last A1C in the office was:  Lab Results  Component Value Date   HGBA1C 5.0 11/18/2018   Lab Results  Component Value Date   GFRNONAA 101 11/18/2018   Patient is on Vitamin D supplement, below goal of 60, was advised to increase dose by 5000 IU:  Lab Results  Component Value Date   VD25OH 45 11/18/2018        Current Medications:  Current Outpatient Medications on File Prior to Visit  Medication Sig  . bisoprolol (ZEBETA) 5 MG tablet Take 1 tablet by mouth once daily  . multivitamin (ONE-A-DAY MEN'S) TABS tablet Take 1 tablet by mouth daily.  . psyllium (METAMUCIL)  58.6 % packet Take 1 packet by mouth daily.   No current facility-administered medications on file prior to visit.     Allergies: No Known Allergies   Medical History:  Past Medical History:  Diagnosis Date  . Alcoholic pancreatitis   . Dental crowns present   . GERD (gastroesophageal reflux disease)   . Hypertension    states under control with med., has been on med. x 4 yr.  . Medial meniscus tear 01/2016   right knee   Family history- Reviewed and unchanged Social history- Reviewed and unchanged   Review of Systems:  Review of Systems  Constitutional: Negative for malaise/fatigue and weight loss.  HENT: Negative for hearing loss and tinnitus.   Eyes: Negative for blurred vision and double vision.  Respiratory: Negative for cough, shortness of breath and wheezing.   Cardiovascular: Negative for chest pain, palpitations, orthopnea, claudication and leg swelling.  Gastrointestinal: Negative for abdominal pain, blood in stool, constipation, diarrhea, heartburn, melena, nausea and vomiting.  Genitourinary: Negative.   Musculoskeletal: Negative for joint pain and myalgias.  Skin: Negative for rash.  Neurological: Negative for dizziness, tingling, sensory change, weakness and headaches.  Endo/Heme/Allergies: Negative for polydipsia.  Psychiatric/Behavioral: Negative.   All other systems reviewed and are negative.     Physical Exam: BP 126/90   Pulse (!) 58   Temp (!) 97.5 F (36.4 C)   Ht 6' 1.5" (1.867 m)   Wt 234 lb 6.4 oz (106.3 kg)   SpO2 99%   BMI 30.51 kg/m  Wt Readings from Last 3 Encounters:  05/23/19 234 lb 6.4 oz (106.3 kg)  11/18/18 229 lb 3.2 oz (104 kg)  05/05/18 229 lb 6.4 oz (104.1 kg)   General Appearance: Well nourished, in no apparent distress. Eyes: PERRLA, EOMs, conjunctiva no swelling or erythema Sinuses: No Frontal/maxillary tenderness ENT/Mouth: Ext aud canals clear, TMs without erythema, bulging. No erythema, swelling, or exudate on post  pharynx.  Tonsils not swollen or erythematous. Hearing normal.  Neck: Supple, thyroid normal.  Respiratory: Respiratory effort normal, BS equal bilaterally without rales, rhonchi, wheezing or stridor.  Cardio: RRR with no MRGs. Brisk peripheral pulses without edema.  Abdomen: Soft, + BS.  Non tender, no guarding, rebound, hernias, masses. Lymphatics: Non tender without lymphadenopathy.  Musculoskeletal: Full ROM, 5/5 strength, Normal gait Skin: Warm, dry without rashes, lesions, ecchymosis.  Neuro: Cranial nerves intact. No cerebellar symptoms.  Psych: Awake and oriented X 3, normal affect, Insight and Judgment appropriate.    Dan Maker, NP 8:55 AM [facility]

## 2019-05-23 ENCOUNTER — Encounter: Payer: Self-pay | Admitting: Adult Health

## 2019-05-23 ENCOUNTER — Other Ambulatory Visit: Payer: Self-pay

## 2019-05-23 ENCOUNTER — Ambulatory Visit: Payer: BC Managed Care – PPO | Admitting: Adult Health

## 2019-05-23 VITALS — BP 126/90 | HR 58 | Temp 97.5°F | Ht 73.5 in | Wt 234.4 lb

## 2019-05-23 DIAGNOSIS — E663 Overweight: Secondary | ICD-10-CM

## 2019-05-23 DIAGNOSIS — E559 Vitamin D deficiency, unspecified: Secondary | ICD-10-CM | POA: Diagnosis not present

## 2019-05-23 DIAGNOSIS — R7309 Other abnormal glucose: Secondary | ICD-10-CM

## 2019-05-23 DIAGNOSIS — Z79899 Other long term (current) drug therapy: Secondary | ICD-10-CM | POA: Diagnosis not present

## 2019-05-23 DIAGNOSIS — E782 Mixed hyperlipidemia: Secondary | ICD-10-CM

## 2019-05-23 DIAGNOSIS — I10 Essential (primary) hypertension: Secondary | ICD-10-CM | POA: Diagnosis not present

## 2019-05-23 NOTE — Patient Instructions (Signed)
Goals    . Exercise 150 min/wk Moderate Activity    . Weight (lb) < 215 lb (97.5 kg)         When it comes to diets, agreement about the perfect plan isn't easy to find, even among the experts. Experts at the [facility] [employer] developed an idea known as the Healthy Eating Plate. Just imagine a plate divided into logical, healthy portions.  The emphasis is on diet quality:  Load up on vegetables and fruits - one-half of your plate: Aim for color and variety, and remember that potatoes don't count.  Go for whole grains - one-quarter of your plate: Whole wheat, barley, wheat berries, quinoa, oats, brown rice, and foods made with them. If you want pasta, go with whole wheat pasta.  Protein power - one-quarter of your plate: Fish, chicken, beans, and nuts are all healthy, versatile protein sources. Limit red meat.  The diet, however, does go beyond the plate, offering a few other suggestions.  Use healthy plant oils, such as olive, canola, soy, corn, sunflower and peanut. Check the labels, and avoid partially hydrogenated oil, which have unhealthy trans fats.  If you're thirsty, drink water. Coffee and tea are good in moderation, but skip sugary drinks and limit milk and dairy products to one or two daily servings.  The type of carbohydrate in the diet is more important than the amount. Some sources of carbohydrates, such as vegetables, fruits, whole grains, and beans--are healthier than others.  Finally, stay active.

## 2019-05-24 LAB — CBC WITH DIFFERENTIAL/PLATELET
Absolute Monocytes: 459 cells/uL (ref 200–950)
Basophils Absolute: 41 cells/uL (ref 0–200)
Basophils Relative: 1 %
Eosinophils Absolute: 78 cells/uL (ref 15–500)
Eosinophils Relative: 1.9 %
HCT: 47.1 % (ref 38.5–50.0)
Hemoglobin: 16 g/dL (ref 13.2–17.1)
Lymphs Abs: 1078 cells/uL (ref 850–3900)
MCH: 29.7 pg (ref 27.0–33.0)
MCHC: 34 g/dL (ref 32.0–36.0)
MCV: 87.5 fL (ref 80.0–100.0)
MPV: 10 fL (ref 7.5–12.5)
Monocytes Relative: 11.2 %
Neutro Abs: 2444 cells/uL (ref 1500–7800)
Neutrophils Relative %: 59.6 %
Platelets: 211 10*3/uL (ref 140–400)
RBC: 5.38 10*6/uL (ref 4.20–5.80)
RDW: 11.9 % (ref 11.0–15.0)
Total Lymphocyte: 26.3 %
WBC: 4.1 10*3/uL (ref 3.8–10.8)

## 2019-05-24 LAB — COMPLETE METABOLIC PANEL WITH GFR
AG Ratio: 1.7 (calc) (ref 1.0–2.5)
ALT: 19 U/L (ref 9–46)
AST: 24 U/L (ref 10–40)
Albumin: 4.7 g/dL (ref 3.6–5.1)
Alkaline phosphatase (APISO): 51 U/L (ref 36–130)
BUN: 10 mg/dL (ref 7–25)
CO2: 32 mmol/L (ref 20–32)
Calcium: 10 mg/dL (ref 8.6–10.3)
Chloride: 99 mmol/L (ref 98–110)
Creat: 0.88 mg/dL (ref 0.60–1.35)
GFR, Est African American: 125 mL/min/{1.73_m2} (ref >=60)
GFR, Est Non African American: 107 mL/min/{1.73_m2} (ref >=60)
Globulin: 2.8 g/dL (calc) (ref 1.9–3.7)
Glucose, Bld: 85 mg/dL (ref 65–99)
Potassium: 4.8 mmol/L (ref 3.5–5.3)
Sodium: 137 mmol/L (ref 135–146)
Total Bilirubin: 0.6 mg/dL (ref 0.2–1.2)
Total Protein: 7.5 g/dL (ref 6.1–8.1)

## 2019-05-24 LAB — VITAMIN D 25 HYDROXY (VIT D DEFICIENCY, FRACTURES): Vit D, 25-Hydroxy: 34 ng/mL (ref 30–100)

## 2019-05-24 LAB — LIPID PANEL
Cholesterol: 167 mg/dL (ref <200)
HDL: 45 mg/dL (ref >=40)
LDL Cholesterol (Calc): 107 mg/dL (calc) — ABNORMAL HIGH
Non-HDL Cholesterol (Calc): 122 mg/dL (calc) (ref <130)
Total CHOL/HDL Ratio: 3.7 (calc) (ref <5.0)
Triglycerides: 63 mg/dL (ref <150)

## 2019-05-24 LAB — TSH: TSH: 1.26 mIU/L (ref 0.40–4.50)

## 2019-07-20 DIAGNOSIS — J0140 Acute pansinusitis, unspecified: Secondary | ICD-10-CM | POA: Diagnosis not present

## 2019-08-30 ENCOUNTER — Other Ambulatory Visit: Payer: Self-pay | Admitting: Adult Health

## 2019-12-12 ENCOUNTER — Encounter: Payer: Self-pay | Admitting: Internal Medicine

## 2019-12-12 NOTE — Progress Notes (Signed)
Annual  Screening/Preventative Visit  & Comprehensive Evaluation & Examination      This very nice 39 y.o.  MWM presents for a Screening /Preventative Visit & comprehensive evaluation and management of multiple medical co-morbidities.  Patient has been followed for HTN, HLD, Prediabetes and Vitamin D Deficiency.     HTN predates since  2011. Patient's BP has been controlled at home.  Today's BP is at goal - 118/86. Patient denies any cardiac symptoms as chest pain, palpitations, shortness of breath, dizziness or ankle swelling.      Patient's hyperlipidemia is not controlled by diet. Last lipids were not at goal:  Lab Results  Component Value Date   CHOL 167 05/23/2019   HDL 45 05/23/2019   LDLCALC 107 (H) 05/23/2019   TRIG 63 05/23/2019   CHOLHDL 3.7 05/23/2019        Patient is moderately overweight  (BMI 30.5+) and is monitored for glucose intolerance   and patient denies reactive hypoglycemic symptoms, visual blurring, diabetic polys or paresthesias. Last A1c was Normal & at goal:   Lab Results  Component Value Date   HGBA1C 5.0 11/18/2018    Wt Readings from Last 3 Encounters:  05/23/19 234 lb 6.4 oz (106.3 kg)  11/18/18 229 lb 3.2 oz (104 kg)  05/05/18 229 lb 6.4 oz (104.1 kg)       Finally, patient has history of Vitamin D Deficiency ("43 /2000) and last vitamin D was still low:  Lab Results  Component Value Date   VD25OH 34 05/23/2019    Current Outpatient Medications on File Prior to Visit  Medication Sig  . bisoprolol  5 MG tablet Take 1 tablet Daily for BP  . multivitamin  Take 1 tablet  daily.  Marland Kitchen METAMUCIL) Take 1 packet  daily.    No Known Allergies   Past Medical History:  Diagnosis Date  . Alcoholic pancreatitis 2017   . Dental crowns present   . GERD (gastroesophageal reflux disease)   . Hypertension    states under control with med., has been on med. x 4 yr.  . Medial meniscus tear 01/2016   right knee   Health Maintenance  Topic Date  Due  . COVID-19 Vaccine (1) Never done  . TETANUS/TDAP  03/11/2025  . Hepatitis C Screening  Completed  . HIV Screening  Completed  . INFLUENZA VACCINE  Discontinued   Immunization History  Administered Date(s) Administered  . PPD Test 09/02/2013, 09/06/2014, 09/24/2015, 10/22/2016, 11/03/2017, 11/18/2018  . Pneumococcal-Unspecified 11/21/2009  . Tdap 11/21/2009, 03/12/2015    Past Surgical History:  Procedure Laterality Date  . ANTERIOR CRUCIATE LIGAMENT REPAIR     bilateral  . KNEE ARTHROSCOPY WITH MEDIAL MENISECTOMY Right 01/28/2016   Procedure: KNEE ARTHROSCOPY WITH PARTIAL MEDIAL MENISCECTOMY;  Surgeon: Jones Broom, MD;  Location: [facility];  Service: Orthopedics;  Laterality: Right;  KNEE ARTHROSCOPY WITH PARTIAL MEDIAL MENISECTOMY  . SHOULDER ARTHROSCOPY W/ ROTATOR CUFF REPAIR Right 02/2015   Family History  Problem Relation Age of Onset  . Hypertension Father   . Hyperlipidemia Father    Social History   Socioeconomic History  . Marital status: Married    Spouse name: Lawrence Marseilles  . Number of children: 1 daughter 82 yo  . Years of education: Not on file  . Highest education level: Not on file  Occupational History  . Heavy equipt operator for [employer]  Tobacco Use  . Smoking status: Former Smoker    Pack years: 0.00  Quit date: 02/09/2005    Years since quitting: 14.8  . Smokeless tobacco: Never Used  Substance and Sexual Activity  . Alcohol use: No  . Drug use: No  . Sexual activity: Not on file     ROS Constitutional: Denies fever, chills, weight loss/gain, headaches, insomnia,  night sweats or change in appetite. Does c/o fatigue. Eyes: Denies redness, blurred vision, diplopia, discharge, itchy or watery eyes.  ENT: Denies discharge, congestion, post nasal drip, epistaxis, sore throat, earache, hearing loss, dental pain, Tinnitus, Vertigo, Sinus pain or snoring.  Cardio: Denies chest pain, palpitations, irregular heartbeat, syncope,  dyspnea, diaphoresis, orthopnea, PND, claudication or edema Respiratory: denies cough, dyspnea, DOE, pleurisy, hoarseness, laryngitis or wheezing.  Gastrointestinal: Denies dysphagia, heartburn, reflux, water brash, pain, cramps, nausea, vomiting, bloating, diarrhea, constipation, hematemesis, melena, hematochezia, jaundice or hemorrhoids Genitourinary: Denies dysuria, frequency, urgency, nocturia, hesitancy, discharge, hematuria or flank pain Musculoskeletal: Denies arthralgia, myalgia, stiffness, Jt. Swelling, pain, limp or strain/sprain. Denies Falls. Skin: Denies puritis, rash, hives, warts, acne, eczema or change in skin lesion Neuro: No weakness, tremor, incoordination, spasms, paresthesia or pain Psychiatric: Denies confusion, memory loss or sensory loss. Denies Depression. Endocrine: Denies change in weight, skin, hair change, nocturia, and paresthesia, diabetic polys, visual blurring or hyper / hypo glycemic episodes.  Heme/Lymph: No excessive bleeding, bruising or enlarged lymph nodes.  Physical Exam  BP 118/86   Pulse (!) 52   Temp (!) 97.3 F (36.3 C)   Resp 16   Ht 6\' 2"  (1.88 m)   Wt 234 lb 9.6 oz (106.4 kg)   SpO2 98%   BMI 30.12 kg/m   General Appearance: Over nourished,  well groomed and in no apparent distress.  Eyes: PERRLA, EOMs, conjunctiva no swelling or erythema, normal fundi and vessels. Sinuses: No frontal/maxillary tenderness ENT/Mouth: EACs patent / TMs  nl. Nares clear without erythema, swelling, mucoid exudates. Oral hygiene is good. No erythema, swelling, or exudate. Tongue normal, non-obstructing. Tonsils not swollen or erythematous. Hearing normal.  Neck: Supple, thyroid not palpable. No bruits, nodes or JVD. Respiratory: Respiratory effort normal.  BS equal and clear bilateral without rales, rhonci, wheezing or stridor. Cardio: Heart sounds are normal with regular rate and rhythm and no murmurs, rubs or gallops. Peripheral pulses are normal and equal  bilaterally without edema. No aortic or femoral bruits. Chest: symmetric with normal excursions and percussion.  Abdomen: Soft, with Nl bowel sounds. Nontender, no guarding, rebound, hernias, masses, or organomegaly.  Lymphatics: Non tender without lymphadenopathy.  Musculoskeletal: Full ROM all peripheral extremities, joint stability, 5/5 strength, and normal gait. Skin: Warm and dry without rashes, lesions, cyanosis, clubbing or  ecchymosis.  Neuro: Cranial nerves intact, reflexes equal bilaterally. Normal muscle tone, no cerebellar symptoms. Sensation intact.  Pysch: Alert and oriented X 3 with normal affect, insight and judgment appropriate.   Assessment and Plan  1. Annual Preventative/Screening Exam    2. Essential hypertension  - EKG 12-Lead - Urinalysis, Routine w reflex microscopic - Microalbumin / creatinine urine ratio - CBC with Differential/Platelet - COMPLETE METABOLIC PANEL WITH GFR - Magnesium - TSH  3. Hyperlipidemia, mixed  - EKG 12-Lead - Lipid panel - TSH  4. Abnormal glucose  - EKG 12-Lead - Hemoglobin A1c - Insulin, random  5. Vitamin D deficiency  - VITAMIN D 25 Hydroxy   6. Obesity (BMI 30.0-34.9)  - TSH  7. Screening-pulmonary TB  - TB Skin Test  8. Screening for colorectal cancer  - POC Hemoccult Bld/Stl   9.  Screening for ischemic heart disease  - EKG 12-Lead  10. FH: hypertension  - EKG 12-Lead  11. Former smoker  - EKG 12-Lead  12. Prostate cancer screening  - PSA  13. Fatigue, unspecified type  - Iron,Total/Total Iron Binding Cap - Vitamin B12 - Testosterone - CBC with Differential/Platelet - TSH  14. Medication management  - Urinalysis, Routine w reflex microscopic - Microalbumin / creatinine urine ratio - CBC with Differential/Platelet - COMPLETE METABOLIC PANEL WITH GFR - Magnesium - Lipid panel - TSH - Hemoglobin A1c - Insulin, random - VITAMIN D 25 Hydroxy           Patient was counseled in  prudent diet, weight control to achieve/maintain BMI less than 25, BP monitoring, regular exercise and medications as discussed.  Discussed med effects and SE's. Routine screening labs and tests as requested with regular follow-up as recommended. Over 40 minutes of exam, counseling, chart review and high complex critical decision making was performed   Marinus Maw, MD

## 2019-12-12 NOTE — Patient Instructions (Signed)

## 2019-12-13 ENCOUNTER — Other Ambulatory Visit: Payer: Self-pay

## 2019-12-13 ENCOUNTER — Ambulatory Visit: Payer: BLUE CROSS/BLUE SHIELD | Admitting: Internal Medicine

## 2019-12-13 VITALS — BP 118/86 | HR 52 | Temp 97.3°F | Resp 16 | Ht 74.0 in | Wt 234.6 lb

## 2019-12-13 DIAGNOSIS — E782 Mixed hyperlipidemia: Secondary | ICD-10-CM

## 2019-12-13 DIAGNOSIS — I10 Essential (primary) hypertension: Secondary | ICD-10-CM

## 2019-12-13 DIAGNOSIS — Z1211 Encounter for screening for malignant neoplasm of colon: Secondary | ICD-10-CM

## 2019-12-13 DIAGNOSIS — Z1389 Encounter for screening for other disorder: Secondary | ICD-10-CM

## 2019-12-13 DIAGNOSIS — Z13220 Encounter for screening for lipoid disorders: Secondary | ICD-10-CM

## 2019-12-13 DIAGNOSIS — R350 Frequency of micturition: Secondary | ICD-10-CM

## 2019-12-13 DIAGNOSIS — Z87891 Personal history of nicotine dependence: Secondary | ICD-10-CM

## 2019-12-13 DIAGNOSIS — Z136 Encounter for screening for cardiovascular disorders: Secondary | ICD-10-CM

## 2019-12-13 DIAGNOSIS — Z8249 Family history of ischemic heart disease and other diseases of the circulatory system: Secondary | ICD-10-CM | POA: Diagnosis not present

## 2019-12-13 DIAGNOSIS — Z79899 Other long term (current) drug therapy: Secondary | ICD-10-CM

## 2019-12-13 DIAGNOSIS — N401 Enlarged prostate with lower urinary tract symptoms: Secondary | ICD-10-CM | POA: Diagnosis not present

## 2019-12-13 DIAGNOSIS — Z0001 Encounter for general adult medical examination with abnormal findings: Secondary | ICD-10-CM

## 2019-12-13 DIAGNOSIS — Z111 Encounter for screening for respiratory tuberculosis: Secondary | ICD-10-CM

## 2019-12-13 DIAGNOSIS — E669 Obesity, unspecified: Secondary | ICD-10-CM

## 2019-12-13 DIAGNOSIS — Z0000 Encounter for general adult medical examination without abnormal findings: Secondary | ICD-10-CM

## 2019-12-13 DIAGNOSIS — Z125 Encounter for screening for malignant neoplasm of prostate: Secondary | ICD-10-CM

## 2019-12-13 DIAGNOSIS — Z130 Encounter for screening for diseases of the blood and blood-forming organs and certain disorders involving the immune mechanism: Secondary | ICD-10-CM

## 2019-12-13 DIAGNOSIS — R5383 Other fatigue: Secondary | ICD-10-CM

## 2019-12-13 DIAGNOSIS — R7309 Other abnormal glucose: Secondary | ICD-10-CM

## 2019-12-13 DIAGNOSIS — E559 Vitamin D deficiency, unspecified: Secondary | ICD-10-CM

## 2019-12-13 DIAGNOSIS — Z131 Encounter for screening for diabetes mellitus: Secondary | ICD-10-CM

## 2019-12-13 DIAGNOSIS — Z1329 Encounter for screening for other suspected endocrine disorder: Secondary | ICD-10-CM

## 2019-12-14 LAB — COMPLETE METABOLIC PANEL WITH GFR
AG Ratio: 1.9 (calc) (ref 1.0–2.5)
ALT: 19 U/L (ref 9–46)
AST: 24 U/L (ref 10–40)
Albumin: 4.7 g/dL (ref 3.6–5.1)
Alkaline phosphatase (APISO): 53 U/L (ref 36–130)
BUN: 9 mg/dL (ref 7–25)
CO2: 33 mmol/L — ABNORMAL HIGH (ref 20–32)
Calcium: 10.1 mg/dL (ref 8.6–10.3)
Chloride: 100 mmol/L (ref 98–110)
Creat: 0.74 mg/dL (ref 0.60–1.35)
GFR, Est African American: 133 mL/min/{1.73_m2} (ref >=60)
GFR, Est Non African American: 115 mL/min/{1.73_m2} (ref >=60)
Globulin: 2.5 g/dL (calc) (ref 1.9–3.7)
Glucose, Bld: 83 mg/dL (ref 65–99)
Potassium: 4.7 mmol/L (ref 3.5–5.3)
Sodium: 139 mmol/L (ref 135–146)
Total Bilirubin: 0.7 mg/dL (ref 0.2–1.2)
Total Protein: 7.2 g/dL (ref 6.1–8.1)

## 2019-12-14 LAB — CBC WITH DIFFERENTIAL/PLATELET
Absolute Monocytes: 374 cells/uL (ref 200–950)
Basophils Absolute: 41 cells/uL (ref 0–200)
Basophils Relative: 1.1 %
Eosinophils Absolute: 100 cells/uL (ref 15–500)
Eosinophils Relative: 2.7 %
HCT: 48.4 % (ref 38.5–50.0)
Hemoglobin: 16 g/dL (ref 13.2–17.1)
Lymphs Abs: 1047 cells/uL (ref 850–3900)
MCH: 29.2 pg (ref 27.0–33.0)
MCHC: 33.1 g/dL (ref 32.0–36.0)
MCV: 88.3 fL (ref 80.0–100.0)
MPV: 10.5 fL (ref 7.5–12.5)
Monocytes Relative: 10.1 %
Neutro Abs: 2139 cells/uL (ref 1500–7800)
Neutrophils Relative %: 57.8 %
Platelets: 209 10*3/uL (ref 140–400)
RBC: 5.48 10*6/uL (ref 4.20–5.80)
RDW: 12.4 % (ref 11.0–15.0)
Total Lymphocyte: 28.3 %
WBC: 3.7 10*3/uL — ABNORMAL LOW (ref 3.8–10.8)

## 2019-12-14 LAB — MICROALBUMIN / CREATININE URINE RATIO
Creatinine, Urine: 53 mg/dL (ref 20–320)
Microalb Creat Ratio: 8 mcg/mg creat (ref <30)
Microalb, Ur: 0.4 mg/dL

## 2019-12-14 LAB — HEMOGLOBIN A1C
Hgb A1c MFr Bld: 5 % of total Hgb (ref <5.7)
Mean Plasma Glucose: 97 (calc)
eAG (mmol/L): 5.4 (calc)

## 2019-12-14 LAB — LIPID PANEL
Cholesterol: 163 mg/dL (ref <200)
HDL: 43 mg/dL (ref >=40)
LDL Cholesterol (Calc): 104 mg/dL (calc) — ABNORMAL HIGH
Non-HDL Cholesterol (Calc): 120 mg/dL (calc) (ref <130)
Total CHOL/HDL Ratio: 3.8 (calc) (ref <5.0)
Triglycerides: 72 mg/dL (ref <150)

## 2019-12-14 LAB — MAGNESIUM: Magnesium: 2 mg/dL (ref 1.5–2.5)

## 2019-12-14 LAB — URINALYSIS, ROUTINE W REFLEX MICROSCOPIC
Bilirubin Urine: NEGATIVE
Glucose, UA: NEGATIVE
Hgb urine dipstick: NEGATIVE
Ketones, ur: NEGATIVE
Leukocytes,Ua: NEGATIVE
Nitrite: NEGATIVE
Protein, ur: NEGATIVE
Specific Gravity, Urine: 1.005 (ref 1.001–1.03)
pH: 6.5 (ref 5.0–8.0)

## 2019-12-14 LAB — VITAMIN D 25 HYDROXY (VIT D DEFICIENCY, FRACTURES): Vit D, 25-Hydroxy: 52 ng/mL (ref 30–100)

## 2019-12-14 LAB — TSH: TSH: 1.74 mIU/L (ref 0.40–4.50)

## 2019-12-14 LAB — PSA: PSA: 0.44 ng/mL (ref <=4.0)

## 2019-12-14 LAB — TESTOSTERONE: Testosterone: 632 ng/dL (ref 250–827)

## 2019-12-14 LAB — INSULIN, RANDOM: Insulin: 5.1 u[IU]/mL

## 2019-12-14 LAB — IRON, TOTAL/TOTAL IRON BINDING CAP
%SAT: 37 % (calc) (ref 20–48)
Iron: 127 ug/dL (ref 50–180)
TIBC: 346 mcg/dL (calc) (ref 250–425)

## 2019-12-14 LAB — VITAMIN B12: Vitamin B-12: 544 pg/mL (ref 200–1100)

## 2019-12-14 NOTE — Progress Notes (Signed)
========================================================== -   Test results slightly outside the reference range are not unusual. If there is anything important, I will review this with you,  otherwise it is considered normal test values.  If you have further questions,  please do not hesitate to contact me at the office or via My Chart.  ==========================================================  -  Both Iron Levels & Vitamin B12 levels -Normal  ==========================================================  -  PSA - Low - Great  ==========================================================  -  Total Chol = 163  Excellent  (Goal is less than 180 )  - Very low risk for Heart Attack  / Stroke ===========================================================  - but   -Bad      LDL Chol = 104   and Goal is less 70  So - Recommend a stricter low cholesterol diet   - Cholesterol only comes from animal sources  - ie. meat, dairy, egg yolks  - Eat all the vegetables you want.  - Avoid meat, especially red meat - Beef AND Pork .  - Avoid cheese & dairy - milk & ice cream.     - Cheese is the most concentrated form of trans-fats which  is the worst thing to clog up our arteries.   - Veggie cheese is OK which can be found in the fresh  produce section at [employer] or [employer] or [employer] ==========================================================  -  A1c - Normal - Great - No Diabetes ==========================================================  -  Vitamin D - a little better - up from 34 to 52, but still low   - Vitamin D goal is between 70-100.   $$$$$$$$$$$$$$$$$$$$$$$$$$$$$$$$$$$$$$$$$$$$$$$$$$$$$$$$$$$$$  - Please INCREASE your Vitamin D by adding 5,000 units more /day   $$$$$$$$$$$$$$$$$$$$$$$$$$$$$$$$$$$$$$$$$$$$$$$$$$$$$$$$$$$$$  - It is very important as a natural anti-inflammatory and helping the  immune system protect against viral infections, like the Covid-19     helping hair, skin, and nails, as well as reducing stroke and  heart attack risk.   - It helps your bones and helps with mood.  - It also decreases numerous cancer risks so please  take it as directed.   - Low Vit D is associated with a 200-300% higher risk for  CANCER   and 200-300% higher risk for HEART   ATTACK  &  STROKE.    - It is also associated with higher death rate at younger ages,   autoimmune diseases like Rheumatoid arthritis, Lupus,  Multiple Sclerosis.     - Also many other serious conditions, like depression, Alzheimer's  Dementia, infertility, muscle aches, fatigue, fibromyalgia   - just to name a few. ==========================================================  -  All Else - CBC - Kidneys - Electrolytes - Liver - Magnesium & Thyroid    - all  Normal / OK ==========================================================

## 2020-01-15 ENCOUNTER — Other Ambulatory Visit: Payer: Self-pay | Admitting: Adult Health

## 2020-03-28 NOTE — Progress Notes (Signed)
FOLLOW UP  Assessment and Plan:   Hypertension Well controlled with current medications  Monitor blood pressure at home; patient to call if consistently greater than 130/80 Continue DASH diet.   Reminder to go to the ER if any CP, SOB, nausea, dizziness, severe HA, changes vision/speech, left arm numbness and tingling and jaw pain.  Cholesterol Currently with mild elevations managed by lifestyle only Discussed saturated/trans fats, processed foods, increasing soluble fiber intake Continue low cholesterol diet and exercise.  Check lipid panel.   Other abnormal glucose Recent A1Cs at goal Discussed diet/exercise, weight management  Defer A1C; check CMP  Overweight Long discussion about weight loss, diet, and exercise Recommended diet heavy in fruits and veggies and low in animal meats, cheeses, and dairy products, appropriate calorie intake Discussed ideal weight for height and initial weight goal (215 lb) Patient will work on increasing exercise, continue to watch diet and aim for calorie deficit Will follow up in 3 months  Vitamin D Def continue supplementation to maintain goal of 60-100 Defer vitamin D  Anxiety He is off of medications and reports doing well; takes melatonin PRN sleep  Stress management techniques discussed, increase water, good sleep hygiene discussed, increase exercise, and increase veggies.   Continue diet and meds as discussed. Further disposition pending results of labs. Discussed med's effects and SE's.   Over 30 minutes of exam, counseling, chart review, and critical decision making was performed.   Future Appointments  Date Time Provider Department Center  06/27/2020  9:30 AM Lucky Cowboy, MD [facility] None  12/24/2020  2:00 PM Lucky Cowboy, MD [facility] None    ----------------------------------------------------------------------------------------------------------------------  HPI 40 y.o. male  presents for 6 month follow up on  hypertension, cholesterol, glucose management, weight, anxiety and vitamin D deficiency.   Daughters are 4 and 34 years old   He endorses relflux sx occasionally after eating too quickly. <1x/week.   he has a diagnosis of anxiety, had PRN xanax for sleep in the evenings, has been able to taper off with improved sleep hygiene.   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 Tries to be active whenever he can, helps with loading equipment.  He is trying to be moderate in his dietary choices.  Weight is down from peak of 315 lb in 2010.  Hx of alcoholic pancreatitis, minimal intake, 1 on the weekend Wt Readings from Last 3 Encounters:  03/29/20 237 lb 6.4 oz (107.7 kg)  12/13/19 234 lb 9.6 oz (106.4 kg)  05/23/19 234 lb 6.4 oz (106.3 kg)   He admits doesn't check BP at home, has cuff, today their BP is BP: 116/88  He does workout. He denies chest pain, shortness of breath, dizziness.   He is not on cholesterol medication and denies myalgias. His cholesterol is not at goal. The cholesterol last visit was:   Lab Results  Component Value Date   CHOL 163 12/13/2019   HDL 43 12/13/2019   LDLCALC 104 (H) 12/13/2019   TRIG 72 12/13/2019   CHOLHDL 3.8 12/13/2019    He has been working on diet and exercise for glucose management, and denies foot ulcerations, increased appetite, nausea, paresthesia of the feet, polydipsia, polyuria, visual disturbances, vomiting and weight loss. Last A1C in the office was:  Lab Results  Component Value Date   HGBA1C 5.0 12/13/2019   Lab Results  Component Value Date   [facility] 115 12/13/2019  Patient is on Vitamin D supplement   Lab Results  Component Value Date   VD25OH 29 12/13/2019        Current Medications:  Current Outpatient Medications on File Prior to Visit  Medication Sig  . bisoprolol (ZEBETA) 5 MG tablet Take        1 tablet       Daily       for BP  .  multivitamin (ONE-A-DAY MEN'S) TABS tablet Take 1 tablet by mouth daily.  . psyllium (METAMUCIL) 58.6 % packet Take 1 packet by mouth daily.   No current facility-administered medications on file prior to visit.     Allergies: Not on File   Medical History:  Past Medical History:  Diagnosis Date  . Alcoholic pancreatitis 2017  . Dental crowns present   . GERD (gastroesophageal reflux disease)   . Hypertension    states under control with med., has been on med. x 4 yr.  . Medial meniscus tear 01/2016   right knee   Family history- Reviewed and unchanged Social history- Reviewed and unchanged   Review of Systems:  Review of Systems  Constitutional: Negative for malaise/fatigue and weight loss.  HENT: Negative for hearing loss and tinnitus.   Eyes: Negative for blurred vision and double vision.  Respiratory: Negative for cough, shortness of breath and wheezing.   Cardiovascular: Negative for chest pain, palpitations, orthopnea, claudication and leg swelling.  Gastrointestinal: Negative for abdominal pain, blood in stool, constipation, diarrhea, heartburn, melena, nausea and vomiting.  Genitourinary: Negative.   Musculoskeletal: Negative for joint pain and myalgias.  Skin: Negative for rash.  Neurological: Negative for dizziness, tingling, sensory change, weakness and headaches.  Endo/Heme/Allergies: Negative for polydipsia.  Psychiatric/Behavioral: Negative.   All other systems reviewed and are negative.     Physical Exam: BP 116/88   Pulse (!) 50   Temp (!) 97.5 F (36.4 C)   Wt 237 lb 6.4 oz (107.7 kg)   SpO2 99%   BMI 30.48 kg/m  Wt Readings from Last 3 Encounters:  03/29/20 237 lb 6.4 oz (107.7 kg)  12/13/19 234 lb 9.6 oz (106.4 kg)  05/23/19 234 lb 6.4 oz (106.3 kg)   General Appearance: Well nourished, in no apparent distress. Eyes: PERRLA, EOMs, conjunctiva no swelling or erythema Sinuses: No Frontal/maxillary tenderness ENT/Mouth: Ext aud canals clear,  TMs without erythema, bulging. No erythema, swelling, or exudate on post pharynx.  Tonsils not swollen or erythematous. Hearing normal.  Neck: Supple, thyroid normal.  Respiratory: Respiratory effort normal, BS equal bilaterally without rales, rhonchi, wheezing or stridor.  Cardio: RRR with no MRGs. Brisk peripheral pulses without edema.  Abdomen: Soft, + BS.  Non tender, no guarding, rebound, hernias, masses. Lymphatics: Non tender without lymphadenopathy.  Musculoskeletal: Full ROM, 5/5 strength, Normal gait Skin: Warm, dry without rashes, lesions, ecchymosis.  Neuro: Cranial nerves intact. No cerebellar symptoms.  Psych: Awake and oriented X 3, normal affect, Insight and Judgment appropriate.    Dan Maker, NP 8:55 AM [facility]

## 2020-03-29 ENCOUNTER — Ambulatory Visit: Payer: BC Managed Care – PPO | Admitting: Adult Health

## 2020-03-29 ENCOUNTER — Other Ambulatory Visit: Payer: Self-pay

## 2020-03-29 ENCOUNTER — Encounter: Payer: Self-pay | Admitting: Adult Health

## 2020-03-29 VITALS — BP 116/88 | HR 50 | Temp 97.5°F | Wt 237.4 lb

## 2020-03-29 DIAGNOSIS — I10 Essential (primary) hypertension: Secondary | ICD-10-CM

## 2020-03-29 DIAGNOSIS — Z79899 Other long term (current) drug therapy: Secondary | ICD-10-CM

## 2020-03-29 DIAGNOSIS — E782 Mixed hyperlipidemia: Secondary | ICD-10-CM | POA: Diagnosis not present

## 2020-03-29 DIAGNOSIS — E663 Overweight: Secondary | ICD-10-CM | POA: Diagnosis not present

## 2020-03-29 DIAGNOSIS — R7309 Other abnormal glucose: Secondary | ICD-10-CM

## 2020-03-29 DIAGNOSIS — E559 Vitamin D deficiency, unspecified: Secondary | ICD-10-CM

## 2020-03-29 NOTE — Patient Instructions (Addendum)
Goals    . Exercise 150 min/wk Moderate Activity    . Weight (lb) < 215 lb (97.5 kg)      Focus on reducing saturated/trans fats ( in oils, potato chips, processed foods)   Increase soluble fiber - old fashioned oats, black beans, chia/flax seeds are excellent sources  Or citrucel/benefiber supplement  If doing meat/dairy, try to get grass fed, free range in small portions      Preventing High Cholesterol Cholesterol is a white, waxy substance similar to fat that the human body needs to help build cells. The liver makes all the cholesterol that a person's body needs. Having high cholesterol (hypercholesterolemia) increases your risk for heart disease and stroke. Extra or excess cholesterol comes from the food that you eat. High cholesterol can often be prevented with diet and lifestyle changes. If you already have high cholesterol, you can control it with diet, lifestyle changes, and medicines. How can high cholesterol affect me? If you have high cholesterol, fatty deposits (plaques) may build up on the walls of your blood vessels. The blood vessels that carry blood away from your heart are called arteries. Plaques make the arteries narrower and stiffer. This in turn can:  Restrict or block blood flow and cause blood clots to form.  Increase your risk for heart attack and stroke. What can increase my risk for high cholesterol? This condition is more likely to develop in people who:  Eat foods that are high in saturated fat or cholesterol. Saturated fat is mostly found in foods that come from animal sources.  Are overweight.  Are not getting enough exercise.  Have a family history of high cholesterol (familial hypercholesterolemia). What actions can I take to prevent this? Nutrition  Eat less saturated fat.  Avoid trans fats (partially hydrogenated oils). These are often found in margarine and in some baked goods, fried foods, and snacks bought in packages.  Avoid precooked  or cured meat, such as bacon, sausages, or meat loaves.  Avoid foods and drinks that have added sugars.  Eat more fruits, vegetables, and whole grains.  Choose healthy sources of protein, such as fish, poultry, lean cuts of red meat, beans, peas, lentils, and nuts.  Choose healthy sources of fat, such as: ? Nuts. ? Vegetable oils, especially olive oil. ? Fish that have healthy fats, such as omega-3 fatty acids. These fish include mackerel or salmon.   Lifestyle  Lose weight if you are overweight. Maintaining a healthy body mass index (BMI) can help prevent or control high cholesterol. It can also lower your risk for diabetes and high blood pressure. Ask your health care provider to help you with a diet and exercise plan to lose weight safely.  Do not use any products that contain nicotine or tobacco, such as cigarettes, e-cigarettes, and chewing tobacco. If you need help quitting, ask your health care provider. Alcohol use  Do not drink alcohol if: ? Your health care provider tells you not to drink. ? You are pregnant, may be pregnant, or are planning to become pregnant.  If you drink alcohol: ? Limit how much you use to:  0-1 drink a day for women.  0-2 drinks a day for men. ? Be aware of how much alcohol is in your drink. In the [place] one drink equals one 12 oz bottle of beer (355 mL), one 5 oz glass of wine (148 mL), or one 1 oz glass of hard liquor (44 mL). Activity  Get enough exercise. Do exercises  as told by your health care provider.  Each week, do at least 150 minutes of exercise that takes a medium level of effort (moderate-intensity exercise). This kind of exercise: ? Makes your heart beat faster while allowing you to still be able to talk. ? Can be done in short sessions several times a day or longer sessions a few times a week. For example, on 5 days each week, you could walk fast or ride your bike 3 times a day for 10 minutes each time.   Medicines  Your health  care provider may recommend medicines to help lower cholesterol. This may be a medicine to lower the amount of cholesterol that your liver makes. You may need medicine if: ? Diet and lifestyle changes have not lowered your cholesterol enough. ? You have high cholesterol and other risk factors for heart disease or stroke.  Take over-the-counter and prescription medicines only as told by your health care provider. General information  Manage your risk factors for high cholesterol. Talk with your health care provider about all your risk factors and how to lower your risk.  Manage other conditions that you have, such as diabetes or high blood pressure (hypertension).  Have blood tests to check your cholesterol levels at regular points in time as told by your health care provider.  Keep all follow-up visits as told by your health care provider. This is important. Where to find more information  American Heart Association: www.heart.org  National Heart, Lung, and Blood Institute: [employer] Summary  High cholesterol increases your risk for heart disease and stroke. By keeping your cholesterol level low, you can reduce your risk for these conditions.  High cholesterol can often be prevented with diet and lifestyle changes.  Work with your health care provider to manage your risk factors, and have your blood tested regularly. This information is not intended to replace advice given to you by your health care provider. Make sure you discuss any questions you have with your health care provider. Document Revised: 11/09/2018 Document Reviewed: 11/09/2018 Elsevier Patient Education  2021 [employer].

## 2020-03-30 LAB — COMPLETE METABOLIC PANEL WITH GFR
AG Ratio: 1.8 (calc) (ref 1.0–2.5)
ALT: 20 U/L (ref 9–46)
AST: 25 U/L (ref 10–40)
Albumin: 4.8 g/dL (ref 3.6–5.1)
Alkaline phosphatase (APISO): 53 U/L (ref 36–130)
BUN: 11 mg/dL (ref 7–25)
CO2: 32 mmol/L (ref 20–32)
Calcium: 9.7 mg/dL (ref 8.6–10.3)
Chloride: 101 mmol/L (ref 98–110)
Creat: 0.8 mg/dL (ref 0.60–1.35)
GFR, Est African American: 129 mL/min/{1.73_m2} (ref >=60)
GFR, Est Non African American: 111 mL/min/{1.73_m2} (ref >=60)
Globulin: 2.7 g/dL (calc) (ref 1.9–3.7)
Glucose, Bld: 77 mg/dL (ref 65–99)
Potassium: 4.5 mmol/L (ref 3.5–5.3)
Sodium: 139 mmol/L (ref 135–146)
Total Bilirubin: 0.6 mg/dL (ref 0.2–1.2)
Total Protein: 7.5 g/dL (ref 6.1–8.1)

## 2020-03-30 LAB — CBC WITH DIFFERENTIAL/PLATELET
Absolute Monocytes: 503 cells/uL (ref 200–950)
Basophils Absolute: 31 cells/uL (ref 0–200)
Basophils Relative: 0.8 %
Eosinophils Absolute: 140 cells/uL (ref 15–500)
Eosinophils Relative: 3.6 %
HCT: 47 % (ref 38.5–50.0)
Hemoglobin: 16.1 g/dL (ref 13.2–17.1)
Lymphs Abs: 1178 cells/uL (ref 850–3900)
MCH: 30.4 pg (ref 27.0–33.0)
MCHC: 34.3 g/dL (ref 32.0–36.0)
MCV: 88.7 fL (ref 80.0–100.0)
MPV: 10.3 fL (ref 7.5–12.5)
Monocytes Relative: 12.9 %
Neutro Abs: 2048 cells/uL (ref 1500–7800)
Neutrophils Relative %: 52.5 %
Platelets: 211 10*3/uL (ref 140–400)
RBC: 5.3 10*6/uL (ref 4.20–5.80)
RDW: 12 % (ref 11.0–15.0)
Total Lymphocyte: 30.2 %
WBC: 3.9 10*3/uL (ref 3.8–10.8)

## 2020-03-30 LAB — TSH: TSH: 1.75 mIU/L (ref 0.40–4.50)

## 2020-03-30 LAB — LIPID PANEL
Cholesterol: 175 mg/dL (ref <200)
HDL: 43 mg/dL (ref >=40)
LDL Cholesterol (Calc): 116 mg/dL (calc) — ABNORMAL HIGH
Non-HDL Cholesterol (Calc): 132 mg/dL (calc) — ABNORMAL HIGH (ref <130)
Total CHOL/HDL Ratio: 4.1 (calc) (ref <5.0)
Triglycerides: 70 mg/dL (ref <150)

## 2020-05-05 ENCOUNTER — Other Ambulatory Visit: Payer: Self-pay | Admitting: Internal Medicine

## 2020-06-26 ENCOUNTER — Encounter: Payer: Self-pay | Admitting: Internal Medicine

## 2020-06-26 NOTE — Progress Notes (Signed)
Future Appointments  Date Time Provider Department Center  12/24/2020  2:00 PM Shane Cowboy, MD [facility] None    History of Present Illness:       This very nice 40 y.o. MWM presents for 6 month follow up with HTN, HLD, Pre-Diabetes and Vitamin D Deficiency.        Patient is treated for HTN  (2011) & BP has been controlled at home. Today's BP is at goal - 124/84. Patient has had no complaints of any cardiac type chest pain, palpitations, dyspnea / orthopnea / PND, dizziness, claudication, or dependent edema.       Hyperlipidemia is near controlled with diet. Last Lipids were not at goal:  Lab Results  Component Value Date   CHOL 175 03/29/2020   HDL 43 03/29/2020   LDLCALC 116 (H) 03/29/2020   TRIG 70 03/29/2020   CHOLHDL 4.1 03/29/2020     Also, the patient is overweight (BMI 30.43) and is followed expectantly for glucose intolerance.  Patient  has had no symptoms of reactive hypoglycemia, diabetic polys, paresthesias or visual blurring.  Last A1c was normal & at goal:  Lab Results  Component Value Date   HGBA1C 5.0 12/13/2019           Further, the patient also has history of Vitamin D Deficiency ("43" /2000)  and supplements vitamin D without any suspected side-effects. Last vitamin D was not at goal (70-100):  Lab Results  Component Value Date   VD25OH 52 12/13/2019    Current Outpatient Medications on File Prior to Visit  Medication Sig  . bisoprolol  5 MG tablet Take 1 tablet  daily for blood pressure  . multivitamin ( TABS tablet Take 1 tabletdaily.  Marland Kitchen METAMUCIL Take 1 packet daily.    PMHx:   Past Medical History:  Diagnosis Date  . Alcoholic pancreatitis 2017  . Dental crowns present   . GERD (gastroesophageal reflux disease)   . Hypertension    states under control with med., has been on med. x 4 yr.  . Medial meniscus tear 01/2016   right knee     Immunization History  Administered Date(s) Administered  . PPD Test  11/03/2017, 11/18/2018, 12/13/2019  . Pneumococcal-23 11/21/2009  . Tdap 11/21/2009, 03/12/2015     Past Surgical History:  Procedure Laterality Date  . ANTERIOR CRUCIATE LIGAMENT REPAIR     bilateral  . KNEE ARTHROSCOPY WITH MEDIAL MENISECTOMY Right 01/28/2016   Procedure: KNEE ARTHROSCOPY WITH PARTIAL MEDIAL MENISCECTOMY;  Surgeon: Jones Broom, MD;  Location:  SURGERY CENTER;  Service: Orthopedics;  Laterality: Right;  KNEE ARTHROSCOPY WITH PARTIAL MEDIAL MENISECTOMY  . SHOULDER ARTHROSCOPY W/ ROTATOR CUFF REPAIR Right 02/2015    FHx:    Reviewed / unchanged  SHx:    Reviewed / unchanged   Systems Review:  Constitutional: Denies fever, chills, wt changes, headaches, insomnia, fatigue, night sweats, change in appetite. Eyes: Denies redness, blurred vision, diplopia, discharge, itchy, watery eyes.  ENT: Denies discharge, congestion, post nasal drip, epistaxis, sore throat, earache, hearing loss, dental pain, tinnitus, vertigo, sinus pain, snoring.  CV: Denies chest pain, palpitations, irregular heartbeat, syncope, dyspnea, diaphoresis, orthopnea, PND, claudication or edema. Respiratory: denies cough, dyspnea, DOE, pleurisy, hoarseness, laryngitis, wheezing.  Gastrointestinal: Denies dysphagia, odynophagia, heartburn, reflux, water brash, abdominal pain or cramps, nausea, vomiting, bloating, diarrhea, constipation, hematemesis, melena, hematochezia  or hemorrhoids. Genitourinary: Denies dysuria, frequency, urgency, nocturia, hesitancy, discharge, hematuria or flank pain. Musculoskeletal: Denies arthralgias, myalgias, stiffness,  jt. swelling, pain, limping or strain/sprain.  Skin: Denies pruritus, rash, hives, warts, acne, eczema or change in skin lesion(s). Neuro: No weakness, tremor, incoordination, spasms, paresthesia or pain. Psychiatric: Denies confusion, memory loss or sensory loss. Endo: Denies change in weight, skin or hair change.  Heme/Lymph: No excessive  bleeding, bruising or enlarged lymph nodes.  Physical Exam  BP 124/84   Pulse (!) 58   Temp (!) 97.3 F (36.3 C)   Resp 16   Ht 6\' 2"  (1.88 m)   Wt 237 lb (107.5 kg)   SpO2 99%   BMI 30.43 kg/m   Appears  overnourished, well groomed  and in no distress.  Eyes: PERRLA, EOMs, conjunctiva no swelling or erythema. Sinuses: No frontal/maxillary tenderness ENT/Mouth: EAC's clear, TM's nl w/o erythema, bulging. Nares clear w/o erythema, swelling, exudates. Oropharynx clear without erythema or exudates. Oral hygiene is good. Tongue normal, non obstructing. Hearing intact.  Neck: Supple. Thyroid not palpable. Car 2+/2+ without bruits, nodes or JVD. Chest: Respirations nl with BS clear & equal w/o rales, rhonchi, wheezing or stridor.  Cor: Heart sounds normal w/ regular rate and rhythm without sig. murmurs, gallops, clicks or rubs. Peripheral pulses normal and equal  without edema.  Abdomen: Soft & bowel sounds normal. Non-tender w/o guarding, rebound, hernias, masses or organomegaly.  Lymphatics: Unremarkable.  Musculoskeletal: Full ROM all peripheral extremities, joint stability, 5/5 strength and normal gait.  Skin: Warm, dry without exposed rashes, lesions or ecchymosis apparent.  Neuro: Cranial nerves intact, reflexes equal bilaterally. Sensory-motor testing grossly intact. Tendon reflexes grossly intact.  Pysch: Alert & oriented x 3.  Insight and judgement nl & appropriate. No ideations.  Assessment and Plan:  1. Essential hypertension  - Continue medication, monitor blood pressure at home.  - Continue DASH diet.  Reminder to go to the ER if any CP,  SOB, nausea, dizziness, severe HA, changes vision/speech.  - CBC with Differential/Platelet - COMPLETE METABOLIC PANEL WITH GFR - Magnesium - TSH  2. Hyperlipidemia, mixed  - Continue diet/meds, exercise,& lifestyle modifications.  - Continue monitor periodic cholesterol/liver & renal functions   - Lipid panel - TSH  3.  Abnormal glucose  - Continue diet, exercise  - Lifestyle modifications.  - Monitor appropriate labs  - Hemoglobin A1c - Insulin, random  4. Vitamin D deficiency  - Continue supplementation.  - VITAMIN D 25 Hydroxy  5. Medication management  - CBC with Differential/Platelet - COMPLETE METABOLIC PANEL WITH GFR - Magnesium - Lipid panel - TSH - Hemoglobin A1c - Insulin, random - VITAMIN D 25 Hydroxy        Discussed  regular exercise, BP monitoring, weight control to achieve/maintain BMI less than 25 and discussed med and SE's. Recommended labs to assess and monitor clinical status with further disposition pending results of labs.  I discussed the assessment and treatment plan with the patient. The patient was provided an opportunity to ask questions and all were answered. The patient agreed with the plan and demonstrated an understanding of the instructions.  I provided over 30 minutes of exam, counseling, chart review and  complex critical decision making.        The patient was advised to call back or seek an in-person evaluation if the symptoms worsen or if the condition fails to improve as anticipated.   , MD

## 2020-06-26 NOTE — Patient Instructions (Signed)

## 2020-06-27 ENCOUNTER — Ambulatory Visit: Payer: BC Managed Care – PPO | Admitting: Internal Medicine

## 2020-06-27 ENCOUNTER — Other Ambulatory Visit: Payer: Self-pay

## 2020-06-27 VITALS — BP 124/84 | HR 58 | Temp 97.3°F | Resp 16 | Ht 74.0 in | Wt 237.0 lb

## 2020-06-27 DIAGNOSIS — Z79899 Other long term (current) drug therapy: Secondary | ICD-10-CM

## 2020-06-27 DIAGNOSIS — E559 Vitamin D deficiency, unspecified: Secondary | ICD-10-CM | POA: Diagnosis not present

## 2020-06-27 DIAGNOSIS — R7309 Other abnormal glucose: Secondary | ICD-10-CM

## 2020-06-27 DIAGNOSIS — I10 Essential (primary) hypertension: Secondary | ICD-10-CM

## 2020-06-27 DIAGNOSIS — E782 Mixed hyperlipidemia: Secondary | ICD-10-CM | POA: Diagnosis not present

## 2020-06-28 LAB — LIPID PANEL
Cholesterol: 174 mg/dL (ref <200)
HDL: 42 mg/dL (ref >=40)
LDL Cholesterol (Calc): 114 mg/dL (calc) — ABNORMAL HIGH
Non-HDL Cholesterol (Calc): 132 mg/dL (calc) — ABNORMAL HIGH (ref <130)
Total CHOL/HDL Ratio: 4.1 (calc) (ref <5.0)
Triglycerides: 83 mg/dL (ref <150)

## 2020-06-28 LAB — CBC WITH DIFFERENTIAL/PLATELET
Absolute Monocytes: 492 cells/uL (ref 200–950)
Basophils Absolute: 19 cells/uL (ref 0–200)
Basophils Relative: 0.5 %
Eosinophils Absolute: 100 cells/uL (ref 15–500)
Eosinophils Relative: 2.7 %
HCT: 47.3 % (ref 38.5–50.0)
Hemoglobin: 16 g/dL (ref 13.2–17.1)
Lymphs Abs: 1114 cells/uL (ref 850–3900)
MCH: 29.8 pg (ref 27.0–33.0)
MCHC: 33.8 g/dL (ref 32.0–36.0)
MCV: 88.1 fL (ref 80.0–100.0)
MPV: 10.4 fL (ref 7.5–12.5)
Monocytes Relative: 13.3 %
Neutro Abs: 1976 cells/uL (ref 1500–7800)
Neutrophils Relative %: 53.4 %
Platelets: 189 10*3/uL (ref 140–400)
RBC: 5.37 10*6/uL (ref 4.20–5.80)
RDW: 12.2 % (ref 11.0–15.0)
Total Lymphocyte: 30.1 %
WBC: 3.7 10*3/uL — ABNORMAL LOW (ref 3.8–10.8)

## 2020-06-28 LAB — COMPLETE METABOLIC PANEL WITH GFR
AG Ratio: 1.7 (calc) (ref 1.0–2.5)
ALT: 15 U/L (ref 9–46)
AST: 22 U/L (ref 10–40)
Albumin: 4.7 g/dL (ref 3.6–5.1)
Alkaline phosphatase (APISO): 54 U/L (ref 36–130)
BUN: 10 mg/dL (ref 7–25)
CO2: 30 mmol/L (ref 20–32)
Calcium: 10.1 mg/dL (ref 8.6–10.3)
Chloride: 101 mmol/L (ref 98–110)
Creat: 0.87 mg/dL (ref 0.60–1.35)
GFR, Est African American: 124 mL/min/{1.73_m2} (ref >=60)
GFR, Est Non African American: 107 mL/min/{1.73_m2} (ref >=60)
Globulin: 2.7 g/dL (calc) (ref 1.9–3.7)
Glucose, Bld: 82 mg/dL (ref 65–99)
Potassium: 5 mmol/L (ref 3.5–5.3)
Sodium: 139 mmol/L (ref 135–146)
Total Bilirubin: 0.6 mg/dL (ref 0.2–1.2)
Total Protein: 7.4 g/dL (ref 6.1–8.1)

## 2020-06-28 LAB — HEMOGLOBIN A1C
Hgb A1c MFr Bld: 5 % of total Hgb (ref <5.7)
Mean Plasma Glucose: 97 mg/dL
eAG (mmol/L): 5.4 mmol/L

## 2020-06-28 LAB — TSH: TSH: 1.69 mIU/L (ref 0.40–4.50)

## 2020-06-28 LAB — MAGNESIUM: Magnesium: 2.1 mg/dL (ref 1.5–2.5)

## 2020-06-28 LAB — INSULIN, RANDOM: Insulin: 5 u[IU]/mL

## 2020-06-28 LAB — VITAMIN D 25 HYDROXY (VIT D DEFICIENCY, FRACTURES): Vit D, 25-Hydroxy: 48 ng/mL (ref 30–100)

## 2020-06-28 NOTE — Progress Notes (Signed)
============================================================ -   Test results slightly outside the reference range are not unusual. If there is anything important, I will review this with you,  otherwise it is considered normal test values.  If you have further questions,  please do not hesitate to contact me at the office or via My Chart.  ============================================================ ============================================================  -  Total Chol = 174 - > Great, BUT . . . . . . . . . .   - Bad /Dangerous LDL Chol = 114 is too high  !      (  Ideal or Goal is less than 70  !  )   - So Recommend a stricter low cholesterol diet   - Cholesterol only comes from animal sources  - ie. meat, dairy, egg yolks  - Eat all the vegetables you want.  - Avoid meat, especially red meat - Beef AND Pork .  - Avoid cheese & dairy - milk & ice cream.     - Cheese is the most concentrated form of trans-fats which  is the worst thing to clog up our arteries.   - Veggie cheese is OK which can be found in the fresh  produce section at [employer] or [employer] or [employer] ============================================================ ============================================================  -  A1c = 5.0% - 12 week average blood sugar - No Diabetes - Great ! ============================================================ ============================================================  -  Vitamin D = 48 - Low   - Vitamin D goal is between 70-100.   - Please INCREASE your Vitamin D &                                       add another 5,000 units /day to your current dose   - It is very important as a natural anti-inflammatory and helping the  immune system protect against viral infections, like the Covid-19    helping hair, skin, and nails, as well as reducing stroke and  heart attack risk.   - It helps your bones and helps with mood.  - It also decreases numerous  cancer risks so please  take it as directed.   - Low Vit D is associated with a 200-300% higher risk for  CANCER   and 200-300% higher risk for HEART   ATTACK  &  STROKE.    - It is also associated with higher death rate at younger ages,   autoimmune diseases like Rheumatoid arthritis, Lupus,  Multiple Sclerosis.     - Also many other serious conditions, like depression, Alzheimer's  Dementia, infertility, muscle aches, fatigue, fibromyalgia   - just to name a few. ============================================================ ============================================================  -  All Else - CBC - Kidneys - Electrolytes - Liver - Magnesium & Thyroid    - all  Normal / OK ============================================================ ============================================================

## 2020-12-24 ENCOUNTER — Encounter: Payer: BC Managed Care – PPO | Admitting: Internal Medicine

## 2021-01-09 ENCOUNTER — Other Ambulatory Visit: Payer: Self-pay | Admitting: Internal Medicine

## 2021-01-09 DIAGNOSIS — M791 Myalgia, unspecified site: Secondary | ICD-10-CM | POA: Diagnosis not present

## 2021-01-09 DIAGNOSIS — R059 Cough, unspecified: Secondary | ICD-10-CM | POA: Diagnosis not present

## 2021-01-09 DIAGNOSIS — R519 Headache, unspecified: Secondary | ICD-10-CM | POA: Diagnosis not present

## 2021-01-09 DIAGNOSIS — R509 Fever, unspecified: Secondary | ICD-10-CM | POA: Diagnosis not present

## 2021-01-09 MED ORDER — DEXAMETHASONE 4 MG PO TABS: ORAL_TABLET | ORAL | 0 refills | Status: DC

## 2021-01-09 MED ORDER — PSEUDOEPHEDRINE HCL ER 120 MG PO TB12: ORAL_TABLET | ORAL | 0 refills | Status: DC

## 2021-02-13 NOTE — Progress Notes (Signed)
Complete Physical  Assessment and Plan:  [name] was seen today for annual exam.  Diagnoses and all orders for this visit:  Encounter for general adult medical examination with abnormal findings Due Yearly  Essential hypertension -     CBC with Differential/Platelet - continue medications, DASH diet, exercise and monitor at home. Call if greater than 130/80.   Go to the ER if any chest pain, shortness of breath, nausea, dizziness, severe HA, changes vision/speech   Hyperlipidemia, mixed Continue diet and exercise -     COMPLETE METABOLIC PANEL WITH GFR -     Lipid panel -     TSH  Abnormal glucose Continue diet and exercise -     Hemoglobin A1c  Vitamin D deficiency Continue Vit D supplementation to maintain therapeutic value between 60-100 -     VITAMIN D 25 Hydroxy (Vit-D Deficiency, Fractures)  Medication management -     Magnesium  Obesity Long discussion about weight loss, diet, and exercise Recommended diet heavy in fruits and veggies and low in animal meats, cheeses, and dairy products, appropriate calorie intake Follow up at next visit   Sinus bradycardia Per EKG today Currently on bisoprolol for blood pressure Monitor  Screening for ischemic heart disease -     EKG 12-Lead  Screening for hematuria or proteinuria -     Urinalysis, Routine w reflex microscopic -     Microalbumin / creatinine urine ratio  Screening for thyroid disorder -     TSH    Discussed med's effects and SE's. Screening labs and tests as requested with regular follow-up as recommended. Over 40 minutes of exam, counseling, chart review and critical decision making was performed  HPI Patient presents for a complete physical.   His blood pressure has been controlled at home, today their BP is BP: 130/82 BP Readings from Last 3 Encounters:  02/15/21 130/82  06/27/20 124/84  03/29/20 116/88    BMI is Body mass index is 30.95 kg/m., he has been working on diet and exercise. Wt  Readings from Last 3 Encounters:  02/15/21 234 lb 9.6 oz (106.4 kg)  06/27/20 237 lb (107.5 kg)  03/29/20 237 lb 6.4 oz (107.7 kg)    He does workout. He denies chest pain, shortness of breath, dizziness.  He is not on cholesterol medication and denies myalgias. His cholesterol is not at goal. The cholesterol last visit was:   Lab Results  Component Value Date   CHOL 174 06/27/2020   HDL 42 06/27/2020   LDLCALC 114 (H) 06/27/2020   TRIG 83 06/27/2020   CHOLHDL 4.1 06/27/2020   He has been working on diet and exercise for abnormal glucose. Last A1C in the office was:  Lab Results  Component Value Date   HGBA1C 5.0 06/27/2020   Last GFR:   Lab Results  Component Value Date   GFRNONAA 107 06/27/2020     Lab Results  Component Value Date   GFRAA 124 06/27/2020   Patient is on Vitamin D supplement.   Lab Results  Component Value Date   VD25OH 48 06/27/2020     Last PSA was: Lab Results  Component Value Date   PSA 0.44 12/13/2019    Current Medications:  Current Outpatient Medications on File Prior to Visit  Medication Sig Dispense Refill   bisoprolol (ZEBETA) 5 MG tablet Take 1 tablet by mouth once daily for blood pressure 90 tablet 3   multivitamin (ONE-A-DAY MEN'S) TABS tablet Take 1 tablet by mouth daily.  psyllium (METAMUCIL) 58.6 % packet Take 1 packet by mouth daily.     dexamethasone (DECADRON) 4 MG tablet Take 1 tab 3 x /day for 2 days,      then 2 x /day for 2  Days,     then 1 tab daily 13 tablet 0   pseudoephedrine (SUDAFED) 120 MG 12 hr tablet Take  1 tablet  2 x /day (every 12 hours)  for Head and Chest Congestion 20 tablet 0   No current facility-administered medications on file prior to visit.   Allergies:  Not on File Health Maintenance:  Immunization History  Administered Date(s) Administered   PPD Test 09/02/2013, 09/06/2014, 09/24/2015, 10/22/2016, 11/03/2017, 11/18/2018, 12/13/2019   Pneumococcal-Unspecified 11/21/2009   Tdap 11/21/2009,  03/12/2015    Tetanus:2017 Pneumovax: N/A Prevnar 13: N/A Flu vaccine:declines Zostavax:  DEXA: N/A Colonoscopy: Age 15 EGD: Eye Exam: Dentist:  Patient Care Team: Unk Pinto, MD as PCP - General (Internal Medicine)  Medical History:  has Essential hypertension; Hyperlipidemia, mixed; Vitamin D deficiency; Medication management; Abnormal glucose; GERD (gastroesophageal reflux disease); and Overweight (BMI 25.0-29.9) on their problem list. Surgical History:  He  has a past surgical history that includes Anterior cruciate ligament repair; Shoulder arthroscopy w/ rotator cuff repair (Right, 02/2015); and Knee arthroscopy with medial menisectomy (Right, 01/28/2016). Family History:  His family history includes Hyperlipidemia in his father; Hypertension in his father. Social History:   reports that he quit smoking about 16 years ago. His smoking use included cigarettes. He has never used smokeless tobacco. He reports that he does not drink alcohol and does not use drugs. Review of Systems:  Review of Systems  Constitutional:  Negative for chills and fever.  HENT:  Negative for congestion, hearing loss and sore throat.   Eyes:  Negative for blurred vision and double vision.  Respiratory:  Negative for cough, shortness of breath and wheezing.   Cardiovascular:  Negative for chest pain and palpitations.  Gastrointestinal:  Negative for abdominal pain, diarrhea, heartburn, nausea and vomiting.  Genitourinary:  Negative for dysuria.  Musculoskeletal:  Negative for back pain and joint pain.  Skin:  Negative for rash.  Neurological:  Negative for dizziness and headaches.  Endo/Heme/Allergies:  Does not bruise/bleed easily.  Psychiatric/Behavioral:  Negative for depression. The patient does not have insomnia.    Physical Exam: Estimated body mass index is 30.95 kg/m as calculated from the following:   Height as of this encounter: 6\' 1"  (1.854 m).   Weight as of this encounter:  234 lb 9.6 oz (106.4 kg). BP 130/82    Pulse (!) 54    Temp 99 F (37.2 C)    Ht 6\' 1"  (1.854 m)    Wt 234 lb 9.6 oz (106.4 kg)    SpO2 99%    BMI 30.95 kg/m  General Appearance: Well nourished, in no apparent distress.  Eyes: PERRLA, EOMs, conjunctiva no swelling or erythema, normal fundi and vessels.  Sinuses: No Frontal/maxillary tenderness  ENT/Mouth: Ext aud canals clear, normal light reflex with TMs without erythema, bulging. Good dentition. No erythema, swelling, or exudate on post pharynx. Tonsils not swollen or erythematous. Hearing normal.  Neck: Supple, thyroid normal. No bruits  Respiratory: Respiratory effort normal, BS equal bilaterally without rales, rhonchi, wheezing or stridor.  Cardio: RRR without murmurs, rubs or gallops. Brisk peripheral pulses without edema.  Chest: symmetric, with normal excursions and percussion.  Abdomen: Soft, nontender, no guarding, rebound, hernias, masses, or organomegaly.  Lymphatics: Non tender without lymphadenopathy.  Genitourinary:  Musculoskeletal: Full ROM all peripheral extremities,5/5 strength, and normal gait.  Skin: Warm, dry without rashes, lesions, ecchymosis. Neuro: Cranial nerves intact, reflexes equal bilaterally. Normal muscle tone, no cerebellar symptoms. Sensation intact.  Psych: Awake and oriented X 3, normal affect, Insight and Judgment appropriate.   EKG: Sinus bradycardia, no ST changes   Gleason Ardoin W Brandon Wiechman 9:04 AM [facility]

## 2021-02-15 ENCOUNTER — Ambulatory Visit: Payer: BC Managed Care – PPO | Admitting: Nurse Practitioner

## 2021-02-15 ENCOUNTER — Encounter: Payer: Self-pay | Admitting: Nurse Practitioner

## 2021-02-15 ENCOUNTER — Other Ambulatory Visit: Payer: Self-pay

## 2021-02-15 VITALS — BP 130/82 | HR 54 | Temp 99.0°F | Ht 73.0 in | Wt 234.6 lb

## 2021-02-15 DIAGNOSIS — E559 Vitamin D deficiency, unspecified: Secondary | ICD-10-CM

## 2021-02-15 DIAGNOSIS — I10 Essential (primary) hypertension: Secondary | ICD-10-CM | POA: Diagnosis not present

## 2021-02-15 DIAGNOSIS — Z79899 Other long term (current) drug therapy: Secondary | ICD-10-CM | POA: Diagnosis not present

## 2021-02-15 DIAGNOSIS — Z1389 Encounter for screening for other disorder: Secondary | ICD-10-CM | POA: Diagnosis not present

## 2021-02-15 DIAGNOSIS — Z131 Encounter for screening for diabetes mellitus: Secondary | ICD-10-CM

## 2021-02-15 DIAGNOSIS — Z1329 Encounter for screening for other suspected endocrine disorder: Secondary | ICD-10-CM

## 2021-02-15 DIAGNOSIS — E669 Obesity, unspecified: Secondary | ICD-10-CM

## 2021-02-15 DIAGNOSIS — Z0000 Encounter for general adult medical examination without abnormal findings: Secondary | ICD-10-CM

## 2021-02-15 DIAGNOSIS — R001 Bradycardia, unspecified: Secondary | ICD-10-CM

## 2021-02-15 DIAGNOSIS — Z13220 Encounter for screening for lipoid disorders: Secondary | ICD-10-CM

## 2021-02-15 DIAGNOSIS — Z136 Encounter for screening for cardiovascular disorders: Secondary | ICD-10-CM | POA: Diagnosis not present

## 2021-02-15 DIAGNOSIS — E663 Overweight: Secondary | ICD-10-CM

## 2021-02-15 DIAGNOSIS — R7309 Other abnormal glucose: Secondary | ICD-10-CM

## 2021-02-15 DIAGNOSIS — Z0001 Encounter for general adult medical examination with abnormal findings: Secondary | ICD-10-CM

## 2021-02-15 DIAGNOSIS — E782 Mixed hyperlipidemia: Secondary | ICD-10-CM

## 2021-02-15 NOTE — Patient Instructions (Signed)

## 2021-02-16 ENCOUNTER — Other Ambulatory Visit: Payer: Self-pay | Admitting: Nurse Practitioner

## 2021-02-16 DIAGNOSIS — E782 Mixed hyperlipidemia: Secondary | ICD-10-CM

## 2021-02-16 LAB — LIPID PANEL
Cholesterol: 179 mg/dL (ref <200)
HDL: 37 mg/dL — ABNORMAL LOW (ref >=40)
LDL Cholesterol (Calc): 123 mg/dL (calc) — ABNORMAL HIGH
Non-HDL Cholesterol (Calc): 142 mg/dL (calc) — ABNORMAL HIGH (ref <130)
Total CHOL/HDL Ratio: 4.8 (calc) (ref <5.0)
Triglycerides: 89 mg/dL (ref <150)

## 2021-02-16 LAB — COMPLETE METABOLIC PANEL WITH GFR
AG Ratio: 1.7 (calc) (ref 1.0–2.5)
ALT: 16 U/L (ref 9–46)
AST: 21 U/L (ref 10–40)
Albumin: 4.8 g/dL (ref 3.6–5.1)
Alkaline phosphatase (APISO): 57 U/L (ref 36–130)
BUN: 12 mg/dL (ref 7–25)
CO2: 33 mmol/L — ABNORMAL HIGH (ref 20–32)
Calcium: 10 mg/dL (ref 8.6–10.3)
Chloride: 100 mmol/L (ref 98–110)
Creat: 0.95 mg/dL (ref 0.60–1.29)
Globulin: 2.8 g/dL (calc) (ref 1.9–3.7)
Glucose, Bld: 73 mg/dL (ref 65–99)
Potassium: 4.9 mmol/L (ref 3.5–5.3)
Sodium: 139 mmol/L (ref 135–146)
Total Bilirubin: 0.4 mg/dL (ref 0.2–1.2)
Total Protein: 7.6 g/dL (ref 6.1–8.1)
eGFR: 102 mL/min/{1.73_m2} (ref >=60)

## 2021-02-16 LAB — CBC WITH DIFFERENTIAL/PLATELET
Absolute Monocytes: 532 cells/uL (ref 200–950)
Basophils Absolute: 48 cells/uL (ref 0–200)
Basophils Relative: 1.1 %
Eosinophils Absolute: 141 cells/uL (ref 15–500)
Eosinophils Relative: 3.2 %
HCT: 47 % (ref 38.5–50.0)
Hemoglobin: 16 g/dL (ref 13.2–17.1)
Lymphs Abs: 1214 cells/uL (ref 850–3900)
MCH: 29.4 pg (ref 27.0–33.0)
MCHC: 34 g/dL (ref 32.0–36.0)
MCV: 86.4 fL (ref 80.0–100.0)
MPV: 10.4 fL (ref 7.5–12.5)
Monocytes Relative: 12.1 %
Neutro Abs: 2464 cells/uL (ref 1500–7800)
Neutrophils Relative %: 56 %
Platelets: 210 10*3/uL (ref 140–400)
RBC: 5.44 10*6/uL (ref 4.20–5.80)
RDW: 12.3 % (ref 11.0–15.0)
Total Lymphocyte: 27.6 %
WBC: 4.4 10*3/uL (ref 3.8–10.8)

## 2021-02-16 LAB — MAGNESIUM: Magnesium: 2 mg/dL (ref 1.5–2.5)

## 2021-02-16 LAB — MICROALBUMIN / CREATININE URINE RATIO
Creatinine, Urine: 182 mg/dL (ref 20–320)
Microalb Creat Ratio: 4 mcg/mg creat (ref <30)
Microalb, Ur: 0.7 mg/dL

## 2021-02-16 LAB — URINALYSIS, ROUTINE W REFLEX MICROSCOPIC
Bilirubin Urine: NEGATIVE
Glucose, UA: NEGATIVE
Hgb urine dipstick: NEGATIVE
Ketones, ur: NEGATIVE
Leukocytes,Ua: NEGATIVE
Nitrite: NEGATIVE
Protein, ur: NEGATIVE
Specific Gravity, Urine: 1.022 (ref 1.001–1.035)
pH: 6 (ref 5.0–8.0)

## 2021-02-16 LAB — TSH: TSH: 1.79 mIU/L (ref 0.40–4.50)

## 2021-02-16 LAB — HEMOGLOBIN A1C
Hgb A1c MFr Bld: 5.2 % of total Hgb (ref <5.7)
Mean Plasma Glucose: 103 mg/dL
eAG (mmol/L): 5.7 mmol/L

## 2021-02-16 LAB — VITAMIN D 25 HYDROXY (VIT D DEFICIENCY, FRACTURES): Vit D, 25-Hydroxy: 45 ng/mL (ref 30–100)

## 2021-02-16 MED ORDER — ROSUVASTATIN CALCIUM 5 MG PO TABS
5.0000 mg | ORAL_TABLET | Freq: Every day | ORAL | 11 refills | Status: DC
Start: 2021-02-16 — End: 2022-09-15

## 2021-07-22 ENCOUNTER — Other Ambulatory Visit: Payer: Self-pay | Admitting: Adult Health

## 2021-08-14 NOTE — Progress Notes (Signed)
FOLLOW UP  Assessment and Plan:   Hypertension Well controlled with current medications  Monitor blood pressure at home; patient to call if consistently greater than 130/80 Continue DASH diet.   Reminder to go to the ER if any CP, SOB, nausea, dizziness, severe HA, changes vision/speech, left arm numbness and tingling and jaw pain.  Cholesterol Currently with mild elevations managed by lifestyle only Discussed saturated/trans fats, processed foods, increasing soluble fiber intake Continue low cholesterol diet and exercise.  Check lipid panel.   Other abnormal glucose Recent A1Cs at goal Discussed diet/exercise, weight management  Defer A1C; check CMP  Obesity Long discussion about weight loss, diet, and exercise Recommended diet heavy in fruits and veggies and low in animal meats, cheeses, and dairy products, appropriate calorie intake Discussed ideal weight for height and initial weight goal (215 lb) Patient will work on increasing exercise, continue to watch diet and aim for calorie deficit Will follow up in 3 months  Vitamin D Def continue supplementation to maintain goal of 60-100 - Check vitamin D  GERD Symptoms are worsening. Not controlled with TUMS and behavior modification - Famotidine 20 mg BID, if symptoms not controlled notify the office  Fatigue Denies snoring- refuses sleep study -testosterone  Medication Management Continued   Continue diet and meds as discussed. Further disposition pending results of labs. Discussed med's effects and SE's.   Over 30 minutes of exam, counseling, chart review, and critical decision making was performed.   Future Appointments  Date Time Provider [place]  02/17/2022  9:00 AM Alycia Rossetti, NP [facility] None    ----------------------------------------------------------------------------------------------------------------------  HPI 41 y.o. male  presents for 6 month follow up on hypertension,  cholesterol, glucose management, weight, anxiety and vitamin D deficiency.   Daughters are 40 and 61 years old   He endorses relflux symptoms which is coming  more frequently-   he has a diagnosis of anxiety, had PRN xanax for sleep in the evenings, has been able to taper off with improved sleep hygiene.   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 Tries to be active whenever he can, helps with loading equipment.  He is trying to be moderate in his dietary choices.  Weight is down from peak of 315 lb in 2010.  Hx of alcoholic pancreatitis, minimal intake, 1 on the weekend Wt Readings from Last 3 Encounters:  08/16/21 228 lb (103.4 kg)  02/15/21 234 lb 9.6 oz (106.4 kg)  06/27/20 237 lb (107.5 kg)   He admits doesn't check BP at home, has cuff, today their BP is BP: 128/78 He is currently on Bisoprolol 5 mg  BP Readings from Last 3 Encounters:  08/16/21 128/78  02/15/21 130/82  06/27/20 124/84     He does workout. He denies chest pain, shortness of breath, dizziness.  He is complaining of more fatigue. Uncertain if he has ever had testosterone checked.  Sleeping well at night.    He is on cholesterol medication, Fish oil daily and denies myalgias. Was prescribed Crestor 5 mg but never picked it up. His cholesterol is not at goal. He is trying to cut back on dairy and eggs, eats very little red meat. The cholesterol last visit was:   Lab Results  Component Value Date   CHOL 179 02/15/2021   HDL 37 (L) 02/15/2021   LDLCALC 123 (H) 02/15/2021   TRIG 89 02/15/2021   CHOLHDL  4.8 02/15/2021    He has been working on diet and exercise for glucose management, and denies foot ulcerations, increased appetite, nausea, paresthesia of the feet, polydipsia, polyuria, visual disturbances, vomiting and weight loss. Last A1C in the office was:  Lab Results  Component Value Date   HGBA1C 5.2 02/15/2021   Lab  Results  Component Value Date   EGFR 102 02/15/2021    Patient is on Vitamin D supplement   Lab Results  Component Value Date   VD25OH 45 02/15/2021        Current Medications:  Current Outpatient Medications on File Prior to Visit  Medication Sig   bisoprolol (ZEBETA) 5 MG tablet Take 1 tablet by mouth once daily for blood pressure   multivitamin (ONE-A-DAY MEN'S) TABS tablet Take 1 tablet by mouth daily.   psyllium (METAMUCIL) 58.6 % packet Take 1 packet by mouth daily.   rosuvastatin (CRESTOR) 5 MG tablet Take 1 tablet (5 mg total) by mouth daily.   No current facility-administered medications on file prior to visit.     Allergies: Not on File   Medical History:  Past Medical History:  Diagnosis Date   Alcoholic pancreatitis 4854   Dental crowns present    GERD (gastroesophageal reflux disease)    Hypertension    states under control with med., has been on med. x 4 yr.   Medial meniscus tear 01/2016   right knee   Family history- Reviewed and unchanged Social history- Reviewed and unchanged   Review of Systems:  Review of Systems  Constitutional:  Positive for malaise/fatigue. Negative for weight loss.  HENT:  Negative for hearing loss and tinnitus.   Eyes:  Negative for blurred vision and double vision.       Occ floater in right eye.   Respiratory:  Negative for cough, shortness of breath and wheezing.   Cardiovascular:  Negative for chest pain, palpitations, orthopnea, claudication and leg swelling.  Gastrointestinal:  Positive for heartburn. Negative for abdominal pain, blood in stool, constipation, diarrhea, melena, nausea and vomiting.  Genitourinary: Negative.   Musculoskeletal:  Negative for joint pain and myalgias.  Skin:  Negative for rash.  Neurological:  Negative for dizziness, tingling, sensory change, weakness and headaches.  Endo/Heme/Allergies:  Negative for polydipsia.  Psychiatric/Behavioral: Negative.    All other systems reviewed and are  negative.     Physical Exam: BP 128/78   Pulse (!) 55   Temp 97.8 F (36.6 C)   Resp 16   Ht 6' 1" (1.854 m)   Wt 228 lb (103.4 kg)   SpO2 99%   BMI 30.08 kg/m  Wt Readings from Last 3 Encounters:  08/16/21 228 lb (103.4 kg)  02/15/21 234 lb 9.6 oz (106.4 kg)  06/27/20 237 lb (107.5 kg)   General Appearance: Well nourished, in no apparent distress. Eyes: PERRLA, EOMs, conjunctiva no swelling or erythema Sinuses: No Frontal/maxillary tenderness ENT/Mouth: Ext aud canals clear, TMs without erythema, bulging. No erythema, swelling, or exudate on post pharynx.  Tonsils not swollen or erythematous. Hearing normal.  Neck: Supple, thyroid normal.  Respiratory: Respiratory effort normal, BS equal bilaterally without rales, rhonchi, wheezing or stridor.  Cardio: RRR with no MRGs. Brisk peripheral pulses without edema.  Abdomen: Soft, + BS.  Non tender, no guarding, rebound, hernias, masses. Lymphatics: Non tender without lymphadenopathy.  Musculoskeletal: Full ROM, 5/5 strength, Normal gait Skin: Warm, dry without rashes, lesions, ecchymosis.  Neuro: Cranial nerves intact. No cerebellar symptoms.  Psych: Awake and oriented  X 3, normal affect, Insight and Judgment appropriate.    Alycia Rossetti, NP 9:46 AM [facility]

## 2021-08-16 ENCOUNTER — Ambulatory Visit: Payer: BC Managed Care – PPO | Admitting: Nurse Practitioner

## 2021-08-16 ENCOUNTER — Encounter: Payer: Self-pay | Admitting: Nurse Practitioner

## 2021-08-16 VITALS — BP 128/78 | HR 55 | Temp 97.8°F | Resp 16 | Ht 73.0 in | Wt 228.0 lb

## 2021-08-16 DIAGNOSIS — R5382 Chronic fatigue, unspecified: Secondary | ICD-10-CM

## 2021-08-16 DIAGNOSIS — E782 Mixed hyperlipidemia: Secondary | ICD-10-CM | POA: Diagnosis not present

## 2021-08-16 DIAGNOSIS — E669 Obesity, unspecified: Secondary | ICD-10-CM

## 2021-08-16 DIAGNOSIS — E663 Overweight: Secondary | ICD-10-CM

## 2021-08-16 DIAGNOSIS — R7309 Other abnormal glucose: Secondary | ICD-10-CM | POA: Diagnosis not present

## 2021-08-16 DIAGNOSIS — Z79899 Other long term (current) drug therapy: Secondary | ICD-10-CM

## 2021-08-16 DIAGNOSIS — I10 Essential (primary) hypertension: Secondary | ICD-10-CM | POA: Diagnosis not present

## 2021-08-16 DIAGNOSIS — E559 Vitamin D deficiency, unspecified: Secondary | ICD-10-CM | POA: Diagnosis not present

## 2021-08-16 DIAGNOSIS — K219 Gastro-esophageal reflux disease without esophagitis: Secondary | ICD-10-CM

## 2021-08-16 MED ORDER — FAMOTIDINE 20 MG PO TABS: 20.0000 mg | ORAL_TABLET | Freq: Two times a day (BID) | ORAL | 1 refills | Status: DC

## 2021-08-17 LAB — COMPLETE METABOLIC PANEL WITH GFR
AG Ratio: 1.6 (calc) (ref 1.0–2.5)
ALT: 16 U/L (ref 9–46)
AST: 21 U/L (ref 10–40)
Albumin: 4.7 g/dL (ref 3.6–5.1)
Alkaline phosphatase (APISO): 56 U/L (ref 36–130)
BUN: 10 mg/dL (ref 7–25)
CO2: 29 mmol/L (ref 20–32)
Calcium: 9.9 mg/dL (ref 8.6–10.3)
Chloride: 101 mmol/L (ref 98–110)
Creat: 0.83 mg/dL (ref 0.60–1.29)
Globulin: 2.9 g/dL (calc) (ref 1.9–3.7)
Glucose, Bld: 81 mg/dL (ref 65–99)
Potassium: 4.5 mmol/L (ref 3.5–5.3)
Sodium: 139 mmol/L (ref 135–146)
Total Bilirubin: 0.4 mg/dL (ref 0.2–1.2)
Total Protein: 7.6 g/dL (ref 6.1–8.1)
eGFR: 112 mL/min/{1.73_m2} (ref >=60)

## 2021-08-17 LAB — LIPID PANEL
Cholesterol: 171 mg/dL (ref <200)
HDL: 42 mg/dL (ref >=40)
LDL Cholesterol (Calc): 110 mg/dL (calc) — ABNORMAL HIGH
Non-HDL Cholesterol (Calc): 129 mg/dL (calc) (ref <130)
Total CHOL/HDL Ratio: 4.1 (calc) (ref <5.0)
Triglycerides: 92 mg/dL (ref <150)

## 2021-08-17 LAB — TESTOSTERONE: Testosterone: 736 ng/dL (ref 250–827)

## 2021-08-17 LAB — VITAMIN D 25 HYDROXY (VIT D DEFICIENCY, FRACTURES): Vit D, 25-Hydroxy: 60 ng/mL (ref 30–100)

## 2021-08-17 LAB — TSH: TSH: 1.89 mIU/L (ref 0.40–4.50)

## 2021-08-17 LAB — CBC WITH DIFFERENTIAL/PLATELET
Absolute Monocytes: 437 cells/uL (ref 200–950)
Basophils Absolute: 38 cells/uL (ref 0–200)
Basophils Relative: 0.9 %
Eosinophils Absolute: 218 cells/uL (ref 15–500)
Eosinophils Relative: 5.2 %
HCT: 47.6 % (ref 38.5–50.0)
Hemoglobin: 15.7 g/dL (ref 13.2–17.1)
Lymphs Abs: 1067 cells/uL (ref 850–3900)
MCH: 29.1 pg (ref 27.0–33.0)
MCHC: 33 g/dL (ref 32.0–36.0)
MCV: 88.1 fL (ref 80.0–100.0)
MPV: 10.5 fL (ref 7.5–12.5)
Monocytes Relative: 10.4 %
Neutro Abs: 2440 cells/uL (ref 1500–7800)
Neutrophils Relative %: 58.1 %
Platelets: 206 10*3/uL (ref 140–400)
RBC: 5.4 10*6/uL (ref 4.20–5.80)
RDW: 12.2 % (ref 11.0–15.0)
Total Lymphocyte: 25.4 %
WBC: 4.2 10*3/uL (ref 3.8–10.8)

## 2021-08-21 DIAGNOSIS — M25512 Pain in left shoulder: Secondary | ICD-10-CM | POA: Diagnosis not present

## 2021-08-21 DIAGNOSIS — M5030 Other cervical disc degeneration, unspecified cervical region: Secondary | ICD-10-CM | POA: Diagnosis not present

## 2021-08-23 ENCOUNTER — Ambulatory Visit: Payer: BC Managed Care – PPO | Admitting: Nurse Practitioner

## 2021-10-02 ENCOUNTER — Ambulatory Visit (INDEPENDENT_AMBULATORY_CARE_PROVIDER_SITE_OTHER): Payer: BC Managed Care – PPO

## 2021-10-02 DIAGNOSIS — Z6828 Body mass index (BMI) 28.0-28.9, adult: Secondary | ICD-10-CM

## 2021-10-02 DIAGNOSIS — I10 Essential (primary) hypertension: Secondary | ICD-10-CM

## 2021-10-02 DIAGNOSIS — E782 Mixed hyperlipidemia: Secondary | ICD-10-CM

## 2021-10-02 NOTE — Progress Notes (Signed)
Patient presents to the office for a nurse visit to have BMI checked for employment/insurance. Height 6' 1.5" (1.867 m), weight 221 lb 12.8 oz (100.6 kg).

## 2021-11-26 ENCOUNTER — Other Ambulatory Visit: Payer: Self-pay | Admitting: Nurse Practitioner

## 2022-01-18 DIAGNOSIS — I10 Essential (primary) hypertension: Secondary | ICD-10-CM | POA: Diagnosis not present

## 2022-01-18 DIAGNOSIS — R0602 Shortness of breath: Secondary | ICD-10-CM | POA: Diagnosis not present

## 2022-01-18 DIAGNOSIS — R030 Elevated blood-pressure reading, without diagnosis of hypertension: Secondary | ICD-10-CM | POA: Diagnosis not present

## 2022-01-18 DIAGNOSIS — K219 Gastro-esophageal reflux disease without esophagitis: Secondary | ICD-10-CM | POA: Diagnosis not present

## 2022-02-17 ENCOUNTER — Encounter: Payer: BC Managed Care – PPO | Admitting: Nurse Practitioner

## 2022-02-18 ENCOUNTER — Encounter: Payer: BC Managed Care – PPO | Admitting: Nurse Practitioner

## 2022-02-20 NOTE — Progress Notes (Deleted)
Complete Physical  Assessment and Plan:  Shane Nguyen was seen today for annual exam.  Diagnoses and all orders for this visit:  Encounter for general adult medical examination with abnormal findings Due Yearly  Essential hypertension -     CBC with Differential/Platelet - continue medications, DASH diet, exercise and monitor at home. Call if greater than 130/80.   Go to the ER if any chest pain, shortness of breath, nausea, dizziness, severe HA, changes vision/speech   Hyperlipidemia, mixed Continue diet and exercise -     COMPLETE METABOLIC PANEL WITH GFR -     Lipid panel -     TSH  Abnormal glucose Continue diet and exercise -     Hemoglobin A1c  Vitamin D deficiency Continue Vit D supplementation to maintain therapeutic value between 60-100 -     VITAMIN D 25 Hydroxy (Vit-D Deficiency, Fractures)  Medication management -     Magnesium  Obesity Long discussion about weight loss, diet, and exercise Recommended diet heavy in fruits and veggies and low in animal meats, cheeses, and dairy products, appropriate calorie intake Follow up at next visit   Sinus bradycardia Per EKG today Currently on bisoprolol for blood pressure Monitor  Screening for ischemic heart disease -     EKG 12-Lead  Screening for hematuria or proteinuria -     Urinalysis, Routine w reflex microscopic -     Microalbumin / creatinine urine ratio  Screening for thyroid disorder -     TSH    Discussed med's effects and SE's. Screening labs and tests as requested with regular follow-up as recommended. Over 40 minutes of exam, counseling, chart review and critical decision making was performed  HPI Patient presents for a complete physical.   His blood pressure has been controlled at home, today their BP is   BP Readings from Last 3 Encounters:  08/16/21 128/78  02/15/21 130/82  06/27/20 124/84    BMI is There is no height or weight on file to calculate BMI., he has been working on diet and  exercise. Wt Readings from Last 3 Encounters:  10/02/21 221 lb 12.8 oz (100.6 kg)  08/16/21 228 lb (103.4 kg)  02/15/21 234 lb 9.6 oz (106.4 kg)    He does workout. He denies chest pain, shortness of breath, dizziness.  He is not on cholesterol medication and denies myalgias. His cholesterol is not at goal. The cholesterol last visit was:   Lab Results  Component Value Date   CHOL 171 08/16/2021   HDL 42 08/16/2021   LDLCALC 110 (H) 08/16/2021   TRIG 92 08/16/2021   CHOLHDL 4.1 08/16/2021   He has been working on diet and exercise for abnormal glucose. Last A1C in the office was:  Lab Results  Component Value Date   HGBA1C 5.2 02/15/2021   Last GFR:   Lab Results  Component Value Date   [facility] 107 06/27/2020     Lab Results  Component Value Date   GFRAA 124 06/27/2020   Patient is on Vitamin D supplement.   Lab Results  Component Value Date   VD25OH 60 08/16/2021     Last PSA was: Lab Results  Component Value Date   PSA 0.44 12/13/2019    Current Medications:  Current Outpatient Medications on File Prior to Visit  Medication Sig Dispense Refill   bisoprolol (ZEBETA) 5 MG tablet Take 1 tablet by mouth once daily for blood pressure 90 tablet 0   famotidine (PEPCID) 20 MG tablet Take  1 tablet (20 mg total) by mouth 2 (two) times daily. 60 tablet 1   multivitamin (ONE-A-DAY MEN'S) TABS tablet Take 1 tablet by mouth daily.     psyllium (METAMUCIL) 58.6 % packet Take 1 packet by mouth daily.     rosuvastatin (CRESTOR) 5 MG tablet Take 1 tablet (5 mg total) by mouth daily. 30 tablet 11   No current facility-administered medications on file prior to visit.   Allergies:  Not on File Health Maintenance:  Immunization History  Administered Date(s) Administered   PPD Test 09/02/2013, 09/06/2014, 09/24/2015, 10/22/2016, 11/03/2017, 11/18/2018, 12/13/2019   Pneumococcal-Unspecified 11/21/2009   Tdap 11/21/2009, 03/12/2015    Tetanus:2017 Pneumovax: N/A Prevnar  13: N/A Flu vaccine:declines Zostavax:  DEXA: N/A Colonoscopy: Age 4 EGD: Eye Exam: Dentist:  Patient Care Team: Unk Pinto, MD as PCP - General (Internal Medicine)  Medical History:  has Essential hypertension; Hyperlipidemia, mixed; Vitamin D deficiency; Medication management; Abnormal glucose; GERD (gastroesophageal reflux disease); and Overweight (BMI 25.0-29.9) on their problem list. Surgical History:  He  has a past surgical history that includes Anterior cruciate ligament repair; Shoulder arthroscopy w/ rotator cuff repair (Right, 02/2015); and Knee arthroscopy with medial menisectomy (Right, 01/28/2016). Family History:  His family history includes Hyperlipidemia in his father; Hypertension in his father. Social History:   reports that he quit smoking about 17 years ago. His smoking use included cigarettes. He has never used smokeless tobacco. He reports that he does not drink alcohol and does not use drugs. Review of Systems:  Review of Systems  Constitutional:  Negative for chills and fever.  HENT:  Negative for congestion, hearing loss and sore throat.   Eyes:  Negative for blurred vision and double vision.  Respiratory:  Negative for cough, shortness of breath and wheezing.   Cardiovascular:  Negative for chest pain and palpitations.  Gastrointestinal:  Negative for abdominal pain, diarrhea, heartburn, nausea and vomiting.  Genitourinary:  Negative for dysuria.  Musculoskeletal:  Negative for back pain and joint pain.  Skin:  Negative for rash.  Neurological:  Negative for dizziness and headaches.  Endo/Heme/Allergies:  Does not bruise/bleed easily.  Psychiatric/Behavioral:  Negative for depression. The patient does not have insomnia.     Physical Exam: Estimated body mass index is 28.87 kg/m as calculated from the following:   Height as of 10/02/21: 6' 1.5" (1.867 m).   Weight as of 10/02/21: 221 lb 12.8 oz (100.6 kg). There were no vitals taken for this  visit. General Appearance: Well nourished, in no apparent distress.  Eyes: PERRLA, EOMs, conjunctiva no swelling or erythema, normal fundi and vessels.  Sinuses: No Frontal/maxillary tenderness  ENT/Mouth: Ext aud canals clear, normal light reflex with TMs without erythema, bulging. Good dentition. No erythema, swelling, or exudate on post pharynx. Tonsils not swollen or erythematous. Hearing normal.  Neck: Supple, thyroid normal. No bruits  Respiratory: Respiratory effort normal, BS equal bilaterally without rales, rhonchi, wheezing or stridor.  Cardio: RRR without murmurs, rubs or gallops. Brisk peripheral pulses without edema.  Chest: symmetric, with normal excursions and percussion.  Abdomen: Soft, nontender, no guarding, rebound, hernias, masses, or organomegaly.  Lymphatics: Non tender without lymphadenopathy.  Genitourinary:  Musculoskeletal: Full ROM all peripheral extremities,5/5 strength, and normal gait.  Skin: Warm, dry without rashes, lesions, ecchymosis. Neuro: Cranial nerves intact, reflexes equal bilaterally. Normal muscle tone, no cerebellar symptoms. Sensation intact.  Psych: Awake and oriented X 3, normal affect, Insight and Judgment appropriate.   EKG: Sinus bradycardia, no ST changes  Shane Nguyen E Shane Nguyen 12:58 PM [facility]

## 2022-02-21 ENCOUNTER — Encounter: Payer: BC Managed Care – PPO | Admitting: Nurse Practitioner

## 2022-02-21 DIAGNOSIS — I10 Essential (primary) hypertension: Secondary | ICD-10-CM

## 2022-02-21 DIAGNOSIS — E782 Mixed hyperlipidemia: Secondary | ICD-10-CM

## 2022-02-21 DIAGNOSIS — Z136 Encounter for screening for cardiovascular disorders: Secondary | ICD-10-CM

## 2022-02-21 DIAGNOSIS — E559 Vitamin D deficiency, unspecified: Secondary | ICD-10-CM

## 2022-02-21 DIAGNOSIS — K219 Gastro-esophageal reflux disease without esophagitis: Secondary | ICD-10-CM

## 2022-02-21 DIAGNOSIS — E669 Obesity, unspecified: Secondary | ICD-10-CM

## 2022-02-21 DIAGNOSIS — Z1329 Encounter for screening for other suspected endocrine disorder: Secondary | ICD-10-CM

## 2022-02-21 DIAGNOSIS — Z79899 Other long term (current) drug therapy: Secondary | ICD-10-CM

## 2022-02-21 DIAGNOSIS — Z0001 Encounter for general adult medical examination with abnormal findings: Secondary | ICD-10-CM

## 2022-02-21 DIAGNOSIS — Z1389 Encounter for screening for other disorder: Secondary | ICD-10-CM

## 2022-02-21 DIAGNOSIS — R7309 Other abnormal glucose: Secondary | ICD-10-CM

## 2022-02-28 ENCOUNTER — Other Ambulatory Visit: Payer: Self-pay | Admitting: Nurse Practitioner

## 2022-03-06 NOTE — Progress Notes (Signed)
Complete Physical  Assessment and Plan:  Shane Nguyen was seen today for annual exam.  Diagnoses and all orders for this visit:  Encounter for general adult medical examination with abnormal findings Due Yearly  Essential hypertension -     CBC with Differential/Platelet - continue medications- bisoprolol 5 mg QD, DASH diet, exercise and monitor at home. Call if greater than 130/80.   Go to the ER if any chest pain, shortness of breath, nausea, dizziness, severe HA, changes vision/speech   Hyperlipidemia, mixed Continue diet and exercise, Rosuvastatin 5 mg -     COMPLETE METABOLIC PANEL WITH GFR -     Lipid panel -     TSH  Abnormal glucose Continue diet and exercise -     Hemoglobin A1c  Vitamin D deficiency Continue Vit D supplementation to maintain therapeutic value between 60-100 -     VITAMIN D 25 Hydroxy (Vit-D Deficiency, Fractures)  Medication management -     Magnesium  GERD Patient advised to avoid spicy, acidic, citrus, chocolate, mints, fruit and fruit juices.  Limit the intake of caffeine, alcohol and Soda.  Don't exercise too soon after eating.  Don't lie down within 3-4 hours of eating.  Elevate the head of your bed.  Continue Famotidine as needed   Obesity Long discussion about weight loss, diet, and exercise Recommended diet heavy in fruits and veggies and low in animal meats, cheeses, and dairy products, appropriate calorie intake Follow up at next visit   Sinus bradycardia Per EKG today Currently on bisoprolol for blood pressure Monitor  Screening for ischemic heart disease -     EKG 12-Lead  Screening for hematuria or proteinuria -     Urinalysis, Routine w reflex microscopic -     Microalbumin / creatinine urine ratio  Screening for thyroid disorder -     TSH    Discussed med's effects and SE's. Screening labs and tests as requested with regular follow-up as recommended. Over 40 minutes of exam, counseling, chart review and critical decision  making was performed  HPI Patient presents for a complete physical. has Essential hypertension; Hyperlipidemia, mixed; Vitamin D deficiency; Medication management; Abnormal glucose; GERD (gastroesophageal reflux disease); and Overweight (BMI 25.0-29.9) on their problem list.   His blood pressure has been controlled at home, Currently on bisoprolol 5 mg daily, today their BP is BP: 138/80 BP Readings from Last 3 Encounters:  03/07/22 138/80  08/16/21 128/78  02/15/21 130/82    BMI is Body mass index is 30.77 kg/m., he has been working on diet and exercise. He is not exercising as much. He is limiting saturated fats somewhat, limits simple carbs slightly. He does have sweet soda or tea 2-3 times a week.  Wt Readings from Last 3 Encounters:  03/07/22 233 lb 3.2 oz (105.8 kg)  10/02/21 221 lb 12.8 oz (100.6 kg)  08/16/21 228 lb (103.4 kg)    He does workout. He denies chest pain, shortness of breath, dizziness.  He is on cholesterol medication, rosuvastatin 5 mg and denies myalgias. His cholesterol is not at goal. The cholesterol last visit was:   Lab Results  Component Value Date   CHOL 171 08/16/2021   HDL 42 08/16/2021   LDLCALC 110 (H) 08/16/2021   TRIG 92 08/16/2021   CHOLHDL 4.1 08/16/2021   He has been working on diet and exercise for abnormal glucose. Last A1C in the office was:  Lab Results  Component Value Date   HGBA1C 5.2 02/15/2021   Last  GFR: Lab Results  Component Value Date   EGFR 112 08/16/2021    Patient is on Vitamin D supplement.   Lab Results  Component Value Date   VD25OH 60 08/16/2021     Last PSA was: Lab Results  Component Value Date   PSA 0.44 12/13/2019   He went to Urgent care several weeks ago he thought he was having chest pain. Everything resulted normal. He is using Famotidine regular now for GERD- symptoms are now well controlled.  Current Medications:  Current Outpatient Medications on File Prior to Visit  Medication Sig Dispense  Refill   bisoprolol (ZEBETA) 5 MG tablet Take 1 tablet by mouth once daily for blood pressure 90 tablet 0   famotidine (PEPCID) 20 MG tablet Take 1 tablet (20 mg total) by mouth 2 (two) times daily. 60 tablet 1   multivitamin (ONE-A-DAY MEN'S) TABS tablet Take 1 tablet by mouth daily.     psyllium (METAMUCIL) 58.6 % packet Take 1 packet by mouth daily.     rosuvastatin (CRESTOR) 5 MG tablet Take 1 tablet (5 mg total) by mouth daily. 30 tablet 11   No current facility-administered medications on file prior to visit.   Allergies:  Not on File Health Maintenance:  Immunization History  Administered Date(s) Administered   PPD Test 09/02/2013, 09/06/2014, 09/24/2015, 10/22/2016, 11/03/2017, 11/18/2018, 12/13/2019   Pneumococcal-Unspecified 11/21/2009   Tdap 11/21/2009, 03/12/2015    Tetanus:2017 Pneumovax: N/A Prevnar 13: N/A Flu vaccine:declines Zostavax:  DEXA: N/A Colonoscopy: Age 35 EGD: Eye Exam: Dentist:  Patient Care Team: Unk Pinto, MD as PCP - General (Internal Medicine)  Medical History:  has Essential hypertension; Hyperlipidemia, mixed; Vitamin D deficiency; Medication management; Abnormal glucose; GERD (gastroesophageal reflux disease); and Overweight (BMI 25.0-29.9) on their problem list. Surgical History:  He  has a past surgical history that includes Anterior cruciate ligament repair; Shoulder arthroscopy w/ rotator cuff repair (Right, 02/2015); and Knee arthroscopy with medial menisectomy (Right, 01/28/2016). Family History:  His family history includes Hyperlipidemia in his father; Hypertension in his father. Social History:   reports that he quit smoking about 17 years ago. His smoking use included cigarettes. He has never used smokeless tobacco. He reports that he does not drink alcohol and does not use drugs. Review of Systems:  Review of Systems  Constitutional:  Negative for chills and fever.  HENT:  Negative for congestion, hearing loss and sore  throat.   Eyes:  Negative for blurred vision and double vision.  Respiratory:  Negative for cough, shortness of breath and wheezing.   Cardiovascular:  Negative for chest pain and palpitations.  Gastrointestinal:  Positive for heartburn (controlled with famotidine). Negative for abdominal pain, diarrhea, nausea and vomiting.  Genitourinary:  Negative for dysuria.  Musculoskeletal:  Negative for back pain and joint pain.  Skin:  Negative for rash.  Neurological:  Negative for dizziness and headaches.  Endo/Heme/Allergies:  Does not bruise/bleed easily.  Psychiatric/Behavioral:  Negative for depression. The patient does not have insomnia.     Physical Exam: Estimated body mass index is 30.77 kg/m as calculated from the following:   Height as of this encounter: 6\' 1"  (1.854 m).   Weight as of this encounter: 233 lb 3.2 oz (105.8 kg). BP 138/80   Pulse (!) 56   Temp 97.9 F (36.6 C)   Resp 16   Ht 6\' 1"  (1.854 m)   Wt 233 lb 3.2 oz (105.8 kg)   SpO2 99%   BMI 30.77 kg/m  General  Appearance: Well nourished, in no apparent distress.  Eyes: PERRLA, EOMs, conjunctiva no swelling or erythema, normal fundi and vessels.  Sinuses: No Frontal/maxillary tenderness  ENT/Mouth: Ext aud canals clear, normal light reflex with TMs without erythema, bulging. Good dentition. No erythema, swelling, or exudate on post pharynx.  Hearing normal.  Neck: Supple, thyroid normal. No bruits  Respiratory: Respiratory effort normal, BS equal bilaterally without rales, rhonchi, wheezing or stridor.  Cardio: RRR without murmurs, rubs or gallops. Brisk peripheral pulses without edema.  Chest: symmetric, with normal excursions and percussion.  Abdomen: Soft, nontender, no guarding, rebound, hernias, masses, or organomegaly.  Lymphatics: Non tender without lymphadenopathy.  Genitourinary: defer, denies issues Musculoskeletal: Full ROM all peripheral extremities,5/5 strength, and normal gait.  Skin: Warm, dry  without rashes, lesions, ecchymosis. Neuro: Cranial nerves intact, reflexes equal bilaterally. Normal muscle tone, no cerebellar symptoms. Sensation intact.  Psych: Awake and oriented X 3, normal affect, Insight and Judgment appropriate.   EKG: Sinus bradycardia, no ST changes   [place] E Cobie Leidner 10:00 AM [facility]

## 2022-03-07 ENCOUNTER — Encounter: Payer: Self-pay | Admitting: Nurse Practitioner

## 2022-03-07 ENCOUNTER — Ambulatory Visit: Payer: BC Managed Care – PPO | Admitting: Nurse Practitioner

## 2022-03-07 VITALS — BP 138/80 | HR 56 | Temp 97.9°F | Resp 16 | Ht 73.0 in | Wt 233.2 lb

## 2022-03-07 DIAGNOSIS — Z13220 Encounter for screening for lipoid disorders: Secondary | ICD-10-CM | POA: Diagnosis not present

## 2022-03-07 DIAGNOSIS — Z1389 Encounter for screening for other disorder: Secondary | ICD-10-CM

## 2022-03-07 DIAGNOSIS — Z0001 Encounter for general adult medical examination with abnormal findings: Secondary | ICD-10-CM

## 2022-03-07 DIAGNOSIS — E559 Vitamin D deficiency, unspecified: Secondary | ICD-10-CM

## 2022-03-07 DIAGNOSIS — Z136 Encounter for screening for cardiovascular disorders: Secondary | ICD-10-CM

## 2022-03-07 DIAGNOSIS — E782 Mixed hyperlipidemia: Secondary | ICD-10-CM

## 2022-03-07 DIAGNOSIS — Z79899 Other long term (current) drug therapy: Secondary | ICD-10-CM

## 2022-03-07 DIAGNOSIS — R001 Bradycardia, unspecified: Secondary | ICD-10-CM

## 2022-03-07 DIAGNOSIS — Z0000 Encounter for general adult medical examination without abnormal findings: Secondary | ICD-10-CM

## 2022-03-07 DIAGNOSIS — I10 Essential (primary) hypertension: Secondary | ICD-10-CM | POA: Diagnosis not present

## 2022-03-07 DIAGNOSIS — Z1329 Encounter for screening for other suspected endocrine disorder: Secondary | ICD-10-CM

## 2022-03-07 DIAGNOSIS — Z131 Encounter for screening for diabetes mellitus: Secondary | ICD-10-CM | POA: Diagnosis not present

## 2022-03-07 DIAGNOSIS — E669 Obesity, unspecified: Secondary | ICD-10-CM

## 2022-03-07 DIAGNOSIS — K219 Gastro-esophageal reflux disease without esophagitis: Secondary | ICD-10-CM

## 2022-03-07 DIAGNOSIS — R7309 Other abnormal glucose: Secondary | ICD-10-CM

## 2022-03-07 NOTE — Patient Instructions (Signed)

## 2022-03-08 LAB — CBC WITH DIFFERENTIAL/PLATELET
Absolute Monocytes: 501 cells/uL (ref 200–950)
Basophils Absolute: 41 cells/uL (ref 0–200)
Basophils Relative: 0.9 %
Eosinophils Absolute: 202 cells/uL (ref 15–500)
Eosinophils Relative: 4.4 %
HCT: 46.1 % (ref 38.5–50.0)
Hemoglobin: 15.7 g/dL (ref 13.2–17.1)
Lymphs Abs: 1279 cells/uL (ref 850–3900)
MCH: 29.6 pg (ref 27.0–33.0)
MCHC: 34.1 g/dL (ref 32.0–36.0)
MCV: 86.8 fL (ref 80.0–100.0)
MPV: 10.7 fL (ref 7.5–12.5)
Monocytes Relative: 10.9 %
Neutro Abs: 2576 cells/uL (ref 1500–7800)
Neutrophils Relative %: 56 %
Platelets: 202 10*3/uL (ref 140–400)
RBC: 5.31 10*6/uL (ref 4.20–5.80)
RDW: 12.1 % (ref 11.0–15.0)
Total Lymphocyte: 27.8 %
WBC: 4.6 10*3/uL (ref 3.8–10.8)

## 2022-03-08 LAB — MAGNESIUM: Magnesium: 2 mg/dL (ref 1.5–2.5)

## 2022-03-08 LAB — VITAMIN D 25 HYDROXY (VIT D DEFICIENCY, FRACTURES): Vit D, 25-Hydroxy: 47 ng/mL (ref 30–100)

## 2022-03-08 LAB — TSH: TSH: 1.46 mIU/L (ref 0.40–4.50)

## 2022-03-08 LAB — URINALYSIS, ROUTINE W REFLEX MICROSCOPIC
Bilirubin Urine: NEGATIVE
Glucose, UA: NEGATIVE
Hgb urine dipstick: NEGATIVE
Ketones, ur: NEGATIVE
Leukocytes,Ua: NEGATIVE
Nitrite: NEGATIVE
Protein, ur: NEGATIVE
Specific Gravity, Urine: 1.013 (ref 1.001–1.035)
pH: 7 (ref 5.0–8.0)

## 2022-03-08 LAB — COMPLETE METABOLIC PANEL WITH GFR
AG Ratio: 1.6 (calc) (ref 1.0–2.5)
ALT: 19 U/L (ref 9–46)
AST: 22 U/L (ref 10–40)
Albumin: 4.5 g/dL (ref 3.6–5.1)
Alkaline phosphatase (APISO): 51 U/L (ref 36–130)
BUN: 11 mg/dL (ref 7–25)
CO2: 31 mmol/L (ref 20–32)
Calcium: 9.4 mg/dL (ref 8.6–10.3)
Chloride: 101 mmol/L (ref 98–110)
Creat: 0.82 mg/dL (ref 0.60–1.29)
Globulin: 2.9 g/dL (calc) (ref 1.9–3.7)
Glucose, Bld: 80 mg/dL (ref 65–99)
Potassium: 4.3 mmol/L (ref 3.5–5.3)
Sodium: 139 mmol/L (ref 135–146)
Total Bilirubin: 0.4 mg/dL (ref 0.2–1.2)
Total Protein: 7.4 g/dL (ref 6.1–8.1)
eGFR: 112 mL/min/{1.73_m2} (ref >=60)

## 2022-03-08 LAB — MICROALBUMIN / CREATININE URINE RATIO
Creatinine, Urine: 89 mg/dL (ref 20–320)
Microalb Creat Ratio: 7 mcg/mg creat (ref <30)
Microalb, Ur: 0.6 mg/dL

## 2022-03-08 LAB — LIPID PANEL
Cholesterol: 159 mg/dL (ref <200)
HDL: 43 mg/dL (ref >=40)
LDL Cholesterol (Calc): 97 mg/dL (calc)
Non-HDL Cholesterol (Calc): 116 mg/dL (calc) (ref <130)
Total CHOL/HDL Ratio: 3.7 (calc) (ref <5.0)
Triglycerides: 94 mg/dL (ref <150)

## 2022-03-08 LAB — HEMOGLOBIN A1C
Hgb A1c MFr Bld: 5.2 % of total Hgb (ref <5.7)
Mean Plasma Glucose: 103 mg/dL
eAG (mmol/L): 5.7 mmol/L

## 2022-05-08 ENCOUNTER — Other Ambulatory Visit: Payer: Self-pay | Admitting: Nurse Practitioner

## 2022-05-08 DIAGNOSIS — K219 Gastro-esophageal reflux disease without esophagitis: Secondary | ICD-10-CM

## 2022-08-04 ENCOUNTER — Other Ambulatory Visit: Payer: Self-pay | Admitting: Nurse Practitioner

## 2022-08-04 DIAGNOSIS — K219 Gastro-esophageal reflux disease without esophagitis: Secondary | ICD-10-CM

## 2022-08-30 ENCOUNTER — Other Ambulatory Visit: Payer: Self-pay | Admitting: Nurse Practitioner

## 2022-08-30 DIAGNOSIS — K219 Gastro-esophageal reflux disease without esophagitis: Secondary | ICD-10-CM

## 2022-09-11 NOTE — Progress Notes (Signed)
FOLLOW UP  Assessment and Plan:   Hypertension Well controlled with current medications - bisoprolol 5 mg Monitor blood pressure at home; patient to call if consistently greater than 130/80 Continue DASH diet.   Reminder to go to the ER if any CP, SOB, nausea, dizziness, severe HA, changes vision/speech, left arm numbness and tingling and jaw pain.  Cholesterol Continue rosuvastatin Discussed saturated/trans fats, processed foods, increasing soluble fiber intake Continue low cholesterol diet and exercise.  Check lipid panel.   Other abnormal glucose Recent A1Cs at goal Discussed diet/exercise, weight management  Defer A1C; check CMP  Overweight BMI 29 Long discussion about weight loss, diet, and exercise Recommended diet heavy in fruits and veggies and low in animal meats, cheeses, and dairy products, appropriate calorie intake Discussed ideal weight for height and initial weight goal (215 lb) Patient will work on increasing exercise, continue to watch diet and aim for calorie deficit Will follow up in 3 months  Vitamin D Def continue supplementation to maintain goal of 60-100 - Check vitamin D  GERD Famotidine 20 mg BID, is controlling symptoms   Medication Management - TSH - Magnesium   Continue diet and meds as discussed. Further disposition pending results of labs. Discussed med's effects and SE's.   Over 30 minutes of exam, counseling, chart review, and critical decision making was performed.   Future Appointments  Date Time Provider Department Center  03/09/2023 10:00 AM Raynelle Dick, NP [facility] None    ----------------------------------------------------------------------------------------------------------------------  HPI 42 y.o. male  presents for 6 month follow up on hypertension, cholesterol, glucose management, weight, anxiety and vitamin D deficiency.   Daughters are 72 and 6 years old   He endorses relflux symptoms which is coming  more  frequently- famotidine is controlling heartburn  he has a diagnosis of anxiety, had PRN xanax for sleep in the evenings, has been able to taper off with improved sleep hygiene.   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 Tries to be active whenever he can, helps with loading equipment.  He is trying to be moderate in his dietary choices. He is trying to limit night time eating Weight is down from peak of 315 lb in 2010.  Hx of alcoholic pancreatitis, minimal intake, 1 on the weekend Wt Readings from Last 3 Encounters:  09/12/22 220 lb 12.8 oz (100.2 kg)  03/07/22 233 lb 3.2 oz (105.8 kg)  10/02/21 221 lb 12.8 oz (100.6 kg)   He admits doesn't check BP at home, has cuff, today their BP is BP: 120/72 He is currently on Bisoprolol 5 mg  BP Readings from Last 3 Encounters:  09/12/22 120/72  03/07/22 138/80  08/16/21 128/78  He does workout. He denies chest pain, shortness of breath, dizziness.  He is complaining of more fatigue. Uncertain if he has ever had testosterone checked.  Sleeping well at night.    He is on cholesterol medication, .Crestor 5 mg and denies myalgias. His cholesterol is not at goal. He is trying to cut back on dairy and eggs, eats very little red meat. The cholesterol last visit was:   Lab Results  Component Value Date   CHOL 159 03/07/2022   HDL 43 03/07/2022   LDLCALC 97 03/07/2022   TRIG 94 03/07/2022   CHOLHDL 3.7 03/07/2022    He has been working on diet and exercise for glucose management, and denies foot ulcerations, increased appetite,  nausea, paresthesia of the feet, polydipsia, polyuria, visual disturbances, vomiting and weight loss. Last A1C in the office was:  Lab Results  Component Value Date   HGBA1C 5.2 03/07/2022   Lab Results  Component Value Date   EGFR 112 03/07/2022    Patient is on Vitamin D supplement   Lab Results  Component Value Date   VD25OH 47  03/07/2022        Current Medications:  Current Outpatient Medications on File Prior to Visit  Medication Sig   bisoprolol (ZEBETA) 5 MG tablet Take 1 tablet by mouth once daily for blood pressure   famotidine (PEPCID) 20 MG tablet Take 1 tablet by mouth twice daily   multivitamin (ONE-A-DAY MEN'S) TABS tablet Take 1 tablet by mouth daily.   psyllium (METAMUCIL) 58.6 % packet Take 1 packet by mouth daily.   rosuvastatin (CRESTOR) 5 MG tablet Take 1 tablet (5 mg total) by mouth daily.   No current facility-administered medications on file prior to visit.     Allergies: Not on File   Medical History:  Past Medical History:  Diagnosis Date   Alcoholic pancreatitis 2017   Dental crowns present    GERD (gastroesophageal reflux disease)    Hypertension    states under control with med., has been on med. x 4 yr.   Medial meniscus tear 01/2016   right knee   Family history- Reviewed and unchanged Social history- Reviewed and unchanged   Review of Systems:  Review of Systems  Constitutional:  Negative for malaise/fatigue and weight loss.  HENT:  Negative for hearing loss and tinnitus.   Eyes:  Negative for blurred vision and double vision.  Respiratory:  Negative for cough, shortness of breath and wheezing.   Cardiovascular:  Negative for chest pain, palpitations, orthopnea, claudication and leg swelling.  Gastrointestinal:  Positive for heartburn (controlled with famotidine). Negative for abdominal pain, blood in stool, constipation, diarrhea, melena, nausea and vomiting.  Genitourinary: Negative.   Musculoskeletal:  Negative for joint pain and myalgias.  Skin:  Negative for rash.  Neurological:  Negative for dizziness, tingling, sensory change, weakness and headaches.  Endo/Heme/Allergies:  Negative for polydipsia.  Psychiatric/Behavioral: Negative.    All other systems reviewed and are negative.     Physical Exam: BP 120/72   Pulse (!) 59   Temp 97.9 F (36.6 C)    Resp 16   Ht 6\' 1"  (1.854 m)   Wt 220 lb 12.8 oz (100.2 kg)   SpO2 99%   BMI 29.13 kg/m  Wt Readings from Last 3 Encounters:  09/12/22 220 lb 12.8 oz (100.2 kg)  03/07/22 233 lb 3.2 oz (105.8 kg)  10/02/21 221 lb 12.8 oz (100.6 kg)   General Appearance: Well nourished, in no apparent distress. Eyes: PERRLA, EOMs, conjunctiva no swelling or erythema Sinuses: No Frontal/maxillary tenderness ENT/Mouth: Ext aud canals clear, TMs without erythema, bulging. No erythema, swelling, or exudate on post pharynx. Hearing normal.  Neck: Supple, thyroid normal.  Respiratory: Respiratory effort normal, BS equal bilaterally without rales, rhonchi, wheezing or stridor.  Cardio: RRR with no MRGs. Brisk peripheral pulses without edema.  Abdomen: Soft, + BS.  Non tender, no guarding, rebound, hernias, masses. Lymphatics: Non tender without lymphadenopathy.  Musculoskeletal: Full ROM, 5/5 strength, Normal gait Skin: Warm, dry without rashes, lesions, ecchymosis.  Neuro: Cranial nerves intact. No cerebellar symptoms.  Psych: Awake and oriented X 3, normal affect, Insight and Judgment appropriate.    Raynelle Dick, NP 9:33 AM [facility]

## 2022-09-12 ENCOUNTER — Encounter: Payer: Self-pay | Admitting: Nurse Practitioner

## 2022-09-12 ENCOUNTER — Ambulatory Visit: Payer: BC Managed Care – PPO | Admitting: Nurse Practitioner

## 2022-09-12 VITALS — BP 120/72 | HR 59 | Temp 97.9°F | Resp 16 | Ht 73.0 in | Wt 220.8 lb

## 2022-09-12 DIAGNOSIS — R7309 Other abnormal glucose: Secondary | ICD-10-CM

## 2022-09-12 DIAGNOSIS — E782 Mixed hyperlipidemia: Secondary | ICD-10-CM

## 2022-09-12 DIAGNOSIS — E669 Obesity, unspecified: Secondary | ICD-10-CM

## 2022-09-12 DIAGNOSIS — K219 Gastro-esophageal reflux disease without esophagitis: Secondary | ICD-10-CM

## 2022-09-12 DIAGNOSIS — I10 Essential (primary) hypertension: Secondary | ICD-10-CM | POA: Diagnosis not present

## 2022-09-12 DIAGNOSIS — E663 Overweight: Secondary | ICD-10-CM

## 2022-09-12 DIAGNOSIS — Z79899 Other long term (current) drug therapy: Secondary | ICD-10-CM | POA: Diagnosis not present

## 2022-09-12 DIAGNOSIS — E559 Vitamin D deficiency, unspecified: Secondary | ICD-10-CM

## 2022-09-12 LAB — CBC WITH DIFFERENTIAL/PLATELET
Absolute Monocytes: 416 cells/uL (ref 200–950)
Basophils Absolute: 48 cells/uL (ref 0–200)
Basophils Relative: 1.2 %
Eosinophils Absolute: 108 cells/uL (ref 15–500)
Eosinophils Relative: 2.7 %
HCT: 46.5 % (ref 38.5–50.0)
Hemoglobin: 15.9 g/dL (ref 13.2–17.1)
Lymphs Abs: 1248 cells/uL (ref 850–3900)
MCH: 29.8 pg (ref 27.0–33.0)
MCHC: 34.2 g/dL (ref 32.0–36.0)
MCV: 87.1 fL (ref 80.0–100.0)
MPV: 11 fL (ref 7.5–12.5)
Monocytes Relative: 10.4 %
Neutro Abs: 2180 cells/uL (ref 1500–7800)
Neutrophils Relative %: 54.5 %
Platelets: 208 10*3/uL (ref 140–400)
RBC: 5.34 10*6/uL (ref 4.20–5.80)
RDW: 12 % (ref 11.0–15.0)
Total Lymphocyte: 31.2 %
WBC: 4 10*3/uL (ref 3.8–10.8)

## 2022-09-12 NOTE — Patient Instructions (Signed)

## 2022-09-15 ENCOUNTER — Other Ambulatory Visit: Payer: Self-pay | Admitting: Nurse Practitioner

## 2022-09-15 DIAGNOSIS — E782 Mixed hyperlipidemia: Secondary | ICD-10-CM

## 2022-09-15 MED ORDER — ROSUVASTATIN CALCIUM 5 MG PO TABS
5.0000 mg | ORAL_TABLET | Freq: Every day | ORAL | 11 refills | Status: DC
Start: 2022-09-15 — End: 2023-08-03

## 2022-10-03 ENCOUNTER — Other Ambulatory Visit: Payer: Self-pay | Admitting: Nurse Practitioner

## 2022-10-03 DIAGNOSIS — K219 Gastro-esophageal reflux disease without esophagitis: Secondary | ICD-10-CM

## 2022-12-09 ENCOUNTER — Other Ambulatory Visit: Payer: Self-pay | Admitting: Nurse Practitioner

## 2023-01-17 ENCOUNTER — Other Ambulatory Visit: Payer: Self-pay | Admitting: Nurse Practitioner

## 2023-01-17 DIAGNOSIS — K219 Gastro-esophageal reflux disease without esophagitis: Secondary | ICD-10-CM

## 2023-02-18 ENCOUNTER — Other Ambulatory Visit: Payer: Self-pay | Admitting: Nurse Practitioner

## 2023-02-18 DIAGNOSIS — K219 Gastro-esophageal reflux disease without esophagitis: Secondary | ICD-10-CM

## 2023-02-24 ENCOUNTER — Encounter: Payer: BC Managed Care – PPO | Admitting: Nurse Practitioner

## 2023-03-09 ENCOUNTER — Encounter: Payer: BC Managed Care – PPO | Admitting: Nurse Practitioner

## 2023-03-13 ENCOUNTER — Encounter: Payer: BC Managed Care – PPO | Admitting: Nurse Practitioner

## 2023-03-25 ENCOUNTER — Other Ambulatory Visit: Payer: Self-pay

## 2023-03-25 DIAGNOSIS — K219 Gastro-esophageal reflux disease without esophagitis: Secondary | ICD-10-CM

## 2023-03-25 MED ORDER — FAMOTIDINE 20 MG PO TABS: 20.0000 mg | ORAL_TABLET | Freq: Two times a day (BID) | ORAL | 0 refills | Status: DC

## 2023-04-28 ENCOUNTER — Other Ambulatory Visit: Payer: Self-pay

## 2023-04-28 ENCOUNTER — Other Ambulatory Visit: Payer: Self-pay | Admitting: Family

## 2023-04-28 DIAGNOSIS — K219 Gastro-esophageal reflux disease without esophagitis: Secondary | ICD-10-CM

## 2023-04-28 MED ORDER — BISOPROLOL FUMARATE 5 MG PO TABS: ORAL_TABLET | ORAL | 0 refills | Status: DC

## 2023-05-22 ENCOUNTER — Ambulatory Visit: Payer: 59 | Admitting: Family Medicine

## 2023-06-19 ENCOUNTER — Telehealth: Payer: Self-pay | Admitting: Family

## 2023-06-19 ENCOUNTER — Other Ambulatory Visit: Payer: Self-pay | Admitting: Family

## 2023-06-19 DIAGNOSIS — K219 Gastro-esophageal reflux disease without esophagitis: Secondary | ICD-10-CM

## 2023-06-19 MED ORDER — FAMOTIDINE 20 MG PO TABS
20.0000 mg | ORAL_TABLET | Freq: Two times a day (BID) | ORAL | 2 refills | Status: DC
Start: 2023-06-19 — End: 2023-10-16

## 2023-06-19 NOTE — Telephone Encounter (Unsigned)
 Copied from CRM [id number]. Topic: Clinical - Medication Refill >> Jun 19, 2023  8:37 AM Kita Perish H wrote: Medication: famotidine  (PEPCID ) 20 MG tablet  Has the patient contacted their pharmacy? Yes, need provider approval (Agent: If no, request that the patient contact the pharmacy for the refill. If patient does not wish to contact the pharmacy document the reason why and proceed with request.) (Agent: If yes, when and what did the pharmacy advise?)  This is the patient's preferred pharmacy:  [facility] [address] [place] - [address] [place] [address] Phone: [phone] Fax: [phone]  Is this the correct pharmacy for this prescription? Yes If no, delete pharmacy and type the correct one.   Has the prescription been filled recently? No  Is the patient out of the medication? Yes  Has the patient been seen for an appointment in the last year OR does the patient have an upcoming appointment? Yes, former Dr. Cassondra Cliff patient patient has an appointment with Trevor Fudge for 6/23  Can we respond through MyChart? Yes  Agent: Please be advised that Rx refills may take up to 3 business days. We ask that you follow-up with your pharmacy.

## 2023-07-10 ENCOUNTER — Ambulatory Visit: Admitting: Family

## 2023-08-01 DIAGNOSIS — Z1331 Encounter for screening for depression: Secondary | ICD-10-CM | POA: Diagnosis not present

## 2023-08-01 DIAGNOSIS — J018 Other acute sinusitis: Secondary | ICD-10-CM | POA: Diagnosis not present

## 2023-08-03 ENCOUNTER — Encounter: Payer: Self-pay | Admitting: Family

## 2023-08-03 ENCOUNTER — Ambulatory Visit: Admitting: Family

## 2023-08-03 VITALS — BP 128/78 | HR 53 | Temp 97.9°F | Ht 73.0 in | Wt 227.0 lb

## 2023-08-03 DIAGNOSIS — I10 Essential (primary) hypertension: Secondary | ICD-10-CM | POA: Diagnosis not present

## 2023-08-03 DIAGNOSIS — E782 Mixed hyperlipidemia: Secondary | ICD-10-CM | POA: Diagnosis not present

## 2023-08-03 DIAGNOSIS — K219 Gastro-esophageal reflux disease without esophagitis: Secondary | ICD-10-CM

## 2023-08-03 MED ORDER — BISOPROLOL FUMARATE 5 MG PO TABS
ORAL_TABLET | ORAL | 1 refills | Status: DC
Start: 2023-08-03 — End: 2023-10-16

## 2023-08-03 NOTE — Patient Instructions (Signed)
 Welcome to [facility], It was a pleasure meeting you today!    As discussed, I have sent your Bisoprolol  to your pharmacy.  Please schedule a 2 month follow up visit today with fasting labs.    PLEASE NOTE: If you had any LAB tests please let us  know if you have not heard back within a few days. You may see your results on MyChart before we have a chance to review them but we will give you a call once they are reviewed by us . If we ordered any REFERRALS today, please let us  know if you have not heard from their office within the next week.  Let us  know through MyChart if you are needing REFILLS, or have your pharmacy send us  the request. You can also use MyChart to communicate with me or any office staff.  Please try these tips to maintain a healthy lifestyle: It is important that you exercise regularly at least 30 minutes 5 times a week. Think about what you will eat, plan ahead. Choose whole foods, & think  clean, green, fresh or frozen over canned, processed or packaged foods which are more sugary, salty, and fatty. 70 to 75% of food eaten should be fresh vegetables and protein. 2-3  meals daily with healthy snacks between meals, but must be whole fruit, protein or vegetables. Aim to eat over a 10 hour period when you are active, for example, 7am to 5pm, and then STOP after your last meal of the day, drinking only water.  Shorter eating windows, 6-8 hours, are showing benefits in heart disease and blood sugar regulation. Drink water every day! Shoot for 64 ounces daily = 8 cups, no other drink is as healthy! Fruit juice is best enjoyed in a healthy way, by EATING the fruit.

## 2023-08-03 NOTE — Assessment & Plan Note (Signed)
 Taking Pepcid  20mg  bid. Reports having sx if he misses doses. Understands which foods can worsen sx. - Notify office when med refill needed - Advised to continue to monitor acid level of foods, reduce caffeine intake if needed, no lying down for at least 1 hour after eating and no late night meals. - F/U in 58m-33yr

## 2023-08-03 NOTE — Assessment & Plan Note (Signed)
 Off rosuvastatin  for several months. LDL in 09/2022 110, all other numbers wnl. Lipid level reassessment needed to evaluate need for medication. - Reassess lipid levels in a couple of months, fasting.  - Advised on low saturated fat diet.

## 2023-08-03 NOTE — Progress Notes (Signed)
 Patient ID: Shane Nguyen, male    DOB: [birth year], 43 y.o.   MRN: [id number]  Chief Complaint  Patient presents with   New Patient (Initial Visit)    No immediate concerns;  On amoxicillin (Saturday) for sinus infection started a week ago last Friday, Virtual Visit with [facility]  Discussed the use of AI scribe software for clinical note transcription with the patient, who gave verbal consent to proceed.  History of Present Illness Shane Nguyen is a 43 year old male with hypertension and hyperlipidemia who presents for establishment of care.  He manages hypertension with bisoprolol , which was changed from a previous medication due to a gallbladder-related incident. He experiences no side effects from bisoprolol , although a low heart rate was previously noted, possibly necessitating a dose reduction. He denies lethargy or fatigue from the medication. He started rosuvastatin  for hyperlipidemia in August of last year but has not taken it for over three months. His cholesterol levels have not been rechecked following the passing of his previous physician. He is a non-smoker.  Assessment & Plan Hypertension - Hypertension well-managed with bisoprolol . Past bradycardia resolved with dosage reduction. Emphasized weight management to prevent complications. - Continue bisoprolol  as prescribed. - Advise on maintaining a healthy weight.  Hyperlipidemia Off rosuvastatin  for several months. LDL in 09/2022 110, all other numbers wnl. Lipid level reassessment needed to evaluate need for medication. - Advised on low saturated fat diet. - Reassess lipid levels in a couple of months, fasting.    GERD Taking Pepcid  20mg  bid. Reports having sx if he misses doses. Understands which foods can worsen sx. - Notify office when med refill needed - Advised to continue to monitor acid level of foods, reduce caffeine intake if needed, no lying down for at least 1 hour after eating and no late night meals. - F/U in  59m-71yr   Subjective:    Outpatient Medications Prior to Visit  Medication Sig Dispense Refill   bisoprolol  (ZEBETA ) 5 MG tablet Take 1 tablet by mouth once daily for blood pressure 90 tablet 0   famotidine  (PEPCID ) 20 MG tablet Take 1 tablet (20 mg total) by mouth 2 (two) times daily. 60 tablet 2   multivitamin (ONE-A-DAY MEN'S) TABS tablet Take 1 tablet by mouth daily.     psyllium (METAMUCIL) 58.6 % packet Take 1 packet by mouth daily.     rosuvastatin  (CRESTOR ) 5 MG tablet Take 1 tablet (5 mg total) by mouth daily. 30 tablet 11   No facility-administered medications prior to visit.   Past Medical History:  Diagnosis Date   Alcoholic pancreatitis 2017   Dental crowns present    GERD (gastroesophageal reflux disease)    Hypertension    states under control with med., has been on med. x 4 yr.   Medial meniscus tear 01/2016   right knee   Past Surgical History:  Procedure Laterality Date   ANTERIOR CRUCIATE LIGAMENT REPAIR     bilateral   KNEE ARTHROSCOPY WITH MEDIAL MENISECTOMY Right 01/28/2016   Procedure: KNEE ARTHROSCOPY WITH PARTIAL MEDIAL MENISCECTOMY;  Surgeon: Eva Herring, MD;  Location: [facility];  Service: Orthopedics;  Laterality: Right;  KNEE ARTHROSCOPY WITH PARTIAL MEDIAL MENISECTOMY   SHOULDER ARTHROSCOPY W/ ROTATOR CUFF REPAIR Right 02/2015   Not on File    Objective:    Physical Exam Vitals and nursing note reviewed.  Constitutional:      General: He is not in acute distress.    Appearance: Normal appearance.  HENT:  Head: Normocephalic.   Cardiovascular:     Rate and Rhythm: Normal rate and regular rhythm.  Pulmonary:     Effort: Pulmonary effort is normal.     Breath sounds: Normal breath sounds.   Musculoskeletal:        General: Normal range of motion.     Cervical back: Normal range of motion.   Skin:    General: Skin is warm and dry.   Neurological:     Mental Status: He is alert and oriented to person, place, and  time.   Psychiatric:        Mood and Affect: Mood normal.    BP 128/78   Pulse (!) 53   Temp 97.9 F (36.6 C)   Ht 6' 1 (1.854 m)   Wt 227 lb (103 kg)   SpO2 98%   BMI 29.95 kg/m  Wt Readings from Last 3 Encounters:  08/03/23 227 lb (103 kg)  09/12/22 220 lb 12.8 oz (100.2 kg)  03/07/22 233 lb 3.2 oz (105.8 kg)      Lucius Krabbe, NP

## 2023-08-03 NOTE — Assessment & Plan Note (Signed)
 Hypertension well-managed with bisoprolol . Past bradycardia resolved with dosage reduction. Emphasized weight management to prevent complications. - Continue bisoprolol  as prescribed. - Advise on maintaining a healthy weight.

## 2023-09-16 ENCOUNTER — Other Ambulatory Visit: Payer: Self-pay | Admitting: Family

## 2023-09-16 DIAGNOSIS — K219 Gastro-esophageal reflux disease without esophagitis: Secondary | ICD-10-CM

## 2023-10-16 ENCOUNTER — Ambulatory Visit: Admitting: Family

## 2023-10-16 ENCOUNTER — Encounter: Payer: Self-pay | Admitting: Family

## 2023-10-16 VITALS — BP 147/90 | HR 45 | Temp 97.7°F | Ht 73.0 in | Wt 222.2 lb

## 2023-10-16 DIAGNOSIS — I10 Essential (primary) hypertension: Secondary | ICD-10-CM | POA: Diagnosis not present

## 2023-10-16 DIAGNOSIS — K219 Gastro-esophageal reflux disease without esophagitis: Secondary | ICD-10-CM | POA: Diagnosis not present

## 2023-10-16 DIAGNOSIS — Z0000 Encounter for general adult medical examination without abnormal findings: Secondary | ICD-10-CM | POA: Diagnosis not present

## 2023-10-16 LAB — LIPID PANEL
Cholesterol: 176 mg/dL (ref 0–200)
HDL: 38.7 mg/dL — ABNORMAL LOW (ref >39.00)
LDL Cholesterol: 117 mg/dL — ABNORMAL HIGH (ref 0–99)
NonHDL: 137.2
Total CHOL/HDL Ratio: 5
Triglycerides: 101 mg/dL (ref 0.0–149.0)
VLDL: 20.2 mg/dL (ref 0.0–40.0)

## 2023-10-16 LAB — CBC WITH DIFFERENTIAL/PLATELET
Basophils Absolute: 0.1 K/uL (ref 0.0–0.1)
Basophils Relative: 1.5 % (ref 0.0–3.0)
Eosinophils Absolute: 0.1 K/uL (ref 0.0–0.7)
Eosinophils Relative: 3.6 % (ref 0.0–5.0)
HCT: 46.8 % (ref 39.0–52.0)
Hemoglobin: 16 g/dL (ref 13.0–17.0)
Lymphocytes Relative: 34 % (ref 12.0–46.0)
Lymphs Abs: 1.3 K/uL (ref 0.7–4.0)
MCHC: 34.1 g/dL (ref 30.0–36.0)
MCV: 87 fl (ref 78.0–100.0)
Monocytes Absolute: 0.4 K/uL (ref 0.1–1.0)
Monocytes Relative: 11.2 % (ref 3.0–12.0)
Neutro Abs: 2 K/uL (ref 1.4–7.7)
Neutrophils Relative %: 49.7 % (ref 43.0–77.0)
Platelets: 192 K/uL (ref 150.0–400.0)
RBC: 5.38 Mil/uL (ref 4.22–5.81)
RDW: 12.6 % (ref 11.5–15.5)
WBC: 4 K/uL (ref 4.0–10.5)

## 2023-10-16 LAB — COMPREHENSIVE METABOLIC PANEL WITH GFR
ALT: 22 U/L (ref 0–53)
AST: 29 U/L (ref 0–37)
Albumin: 4.6 g/dL (ref 3.5–5.2)
Alkaline Phosphatase: 48 U/L (ref 39–117)
BUN: 11 mg/dL (ref 6–23)
CO2: 31 meq/L (ref 19–32)
Calcium: 9.7 mg/dL (ref 8.4–10.5)
Chloride: 98 meq/L (ref 96–112)
Creatinine, Ser: 0.88 mg/dL (ref 0.40–1.50)
GFR: 104.21 mL/min (ref >60.00)
Glucose, Bld: 87 mg/dL (ref 70–99)
Potassium: 4.4 meq/L (ref 3.5–5.1)
Sodium: 137 meq/L (ref 135–145)
Total Bilirubin: 0.8 mg/dL (ref 0.2–1.2)
Total Protein: 7.7 g/dL (ref 6.0–8.3)

## 2023-10-16 LAB — TSH: TSH: 1.37 u[IU]/mL (ref 0.35–5.50)

## 2023-10-16 MED ORDER — OLMESARTAN MEDOXOMIL 5 MG PO TABS: 5.0000 mg | ORAL_TABLET | Freq: Every day | ORAL | 1 refills | Status: DC

## 2023-10-16 MED ORDER — FAMOTIDINE 20 MG PO TABS
20.0000 mg | ORAL_TABLET | Freq: Two times a day (BID) | ORAL | 1 refills | Status: AC
Start: 2023-10-16 — End: ?

## 2023-10-16 NOTE — Patient Instructions (Addendum)
 It was very nice to see you today!   I will review your lab results via MyChart in a few days. I have sent over the new blood pressure medicine, Olmesartan . Can start taking this tomorrow and stop the Bisoprolol .  You look great! Stay well! Exercise more!   Schedule a 4 week visit to recheck your blood pressure please!     PLEASE NOTE:  If you had any lab tests please let us  know if you have not heard back within a few days. You may see your results on MyChart before we have a chance to review them but we will give you a call once they are reviewed by us . If we ordered any referrals today, please let us  know if you have not heard from their office within the next week.

## 2023-10-16 NOTE — Assessment & Plan Note (Signed)
 Doing well on generic Pepcid . Tries to avoid trigger foods & drinks.  - Refill Famotidine  20mg  bid prescription.  - F/U in 6 mos

## 2023-10-16 NOTE — Progress Notes (Signed)
 Phone: [phone]  Subjective:  Patient 43 y.o. male presenting for annual physical.  Chief Complaint  Patient presents with   Hypertension   Annual Exam  Discussed the use of AI scribe software for clinical note transcription with the patient, who gave verbal consent to proceed.  History of Present Illness   Shane Nguyen is a 43 year old male who presents for an annual physical exam and f/u for his HTN.  Hypertension and antihypertensive therapy - Currently taking bisoprolol  5 mg daily for blood pressure management, with today's dose taken at 6 AM - No headaches, flushing, dizziness, or wooziness - Does not regularly monitor blood pressure at home, though owns a blood pressure cuff - Experiences anxiety at times, which may affect blood pressure readings - Occasionally feels tired, attributed to work  Body weight and physical activity - BMI is below 30 - Recent weight loss from 227 lbs to 222 lbs - Aims to maintain a BMI closer to 25 - Engages in physical work - Exercises in his garage on weekends - Mows the lawn weekly, which takes about 45 minutes  Bowel habits and colorectal cancer screening - Underwent Cologuard testing in the past - No history of colonoscopy - No family history of colon cancer - Regular bowel movements, typically daily - Increased water and fiber intake following hemorrhoid removal - Uses fiber pills to maintain bowel regularity  Substance use - Former smoker, smoked half a pack per day for six years, quit after becoming ill - No use of vaping or THC products - Minimal alcohol intake, with consumption every other weekend    See problem oriented charting- ROS- full  review of systems was completed and negative except for what is noted in HPI above.  The following were reviewed and entered/updated in epic: Past Medical History:  Diagnosis Date   Alcoholic pancreatitis 2017   Dental crowns present    GERD (gastroesophageal reflux disease)     Hypertension    states under control with med., has been on med. x 4 yr.   Medial meniscus tear 01/2016   right knee   Patient Active Problem List   Diagnosis Date Noted   Overweight (BMI 25.0-29.9) 04/20/2017   GERD (gastroesophageal reflux disease) 09/24/2015   Essential hypertension    Hyperlipidemia, mixed    Vitamin D  deficiency    Past Surgical History:  Procedure Laterality Date   ANTERIOR CRUCIATE LIGAMENT REPAIR     bilateral   KNEE ARTHROSCOPY WITH MEDIAL MENISECTOMY Right 01/28/2016   Procedure: KNEE ARTHROSCOPY WITH PARTIAL MEDIAL MENISCECTOMY;  Surgeon: Eva Herring, MD;  Location: [facility];  Service: Orthopedics;  Laterality: Right;  KNEE ARTHROSCOPY WITH PARTIAL MEDIAL MENISECTOMY   SHOULDER ARTHROSCOPY W/ ROTATOR CUFF REPAIR Right 02/2015    Family History  Problem Relation Age of Onset   Hypertension Father    Hyperlipidemia Father     Medications- reviewed and updated Current Outpatient Medications  Medication Sig Dispense Refill   multivitamin (ONE-A-DAY MEN'S) TABS tablet Take 1 tablet by mouth daily.     olmesartan  (BENICAR ) 5 MG tablet Take 1 tablet (5 mg total) by mouth daily. 30 tablet 1   psyllium (METAMUCIL) 58.6 % packet Take 1 packet by mouth daily.     famotidine  (PEPCID ) 20 MG tablet Take 1 tablet (20 mg total) by mouth 2 (two) times daily. 180 tablet 1   No current facility-administered medications for this visit.    Allergies-reviewed and updated Not on File  Social History   Social History Narrative   Not on file    Objective:  BP (!) 147/90 (BP Location: Left Arm, Patient Position: Sitting, Cuff Size: Large)   Pulse (!) 45   Temp 97.7 F (36.5 C) (Temporal)   Ht 6' 1 (1.854 m)   Wt 222 lb 3.2 oz (100.8 kg)   SpO2 100%   BMI 29.32 kg/m  Physical Exam Vitals and nursing note reviewed.  Constitutional:      General: He is not in acute distress.    Appearance: Normal appearance.  HENT:     Head:  Normocephalic.     Right Ear: Tympanic membrane and external ear normal.     Left Ear: Tympanic membrane and external ear normal.     Nose: Nose normal.     Mouth/Throat:     Mouth: Mucous membranes are moist.  Eyes:     Extraocular Movements: Extraocular movements intact.  Cardiovascular:     Rate and Rhythm: Normal rate and regular rhythm.  Pulmonary:     Effort: Pulmonary effort is normal.     Breath sounds: Normal breath sounds.  Abdominal:     General: Abdomen is flat. There is no distension.     Palpations: Abdomen is soft.     Tenderness: There is no abdominal tenderness.  Musculoskeletal:        General: Normal range of motion.     Cervical back: Normal range of motion.  Skin:    General: Skin is warm and dry.  Neurological:     Mental Status: He is alert and oriented to person, place, and time.  Psychiatric:        Mood and Affect: Mood normal.        Behavior: Behavior normal.        Judgment: Judgment normal.     Assessment and Plan   Health Maintenance counseling: 1. Anticipatory guidance: Patient counseled regarding regular dental exams q6 months, eye exams yearly, avoiding smoking and second hand smoke, limiting alcohol to 2 beverages per day.   2. Risk factor reduction:  Advised patient of need for regular exercise and diet rich in fruits and vegetables to reduce risk of heart attack and stroke. Wt Readings from Last 3 Encounters:  10/16/23 222 lb 3.2 oz (100.8 kg)  08/03/23 227 lb (103 kg)  09/12/22 220 lb 12.8 oz (100.2 kg)   3. Immunizations/screenings/ancillary studies Immunization History  Administered Date(s) Administered   PPD Test 09/02/2013, 09/06/2014, 09/24/2015, 10/22/2016, 11/03/2017, 11/18/2018, 12/13/2019   Pneumococcal-Unspecified 11/21/2009   Tdap 11/21/2009, 03/12/2015   There are no preventive care reminders to display for this patient.   4. Prostate cancer screening >55yo - risk factors?  Lab Results  Component Value Date   PSA  0.44 12/13/2019   PSA 0.5 11/18/2018    5. Colon cancer screening: has completed Cologuard 6. Skin cancer screening-  advised regular sunscreen use. Denies worrisome, changing, or new skin lesions.  7. Smoking associated screening (lung cancer screening, AAA screen 65-75, UA)- ex smoker-  6 yrs 1 ppd - 6 yr pack history 8. STD screening - N/A 9. Alcohol screening: every other week, 2-3 drinks 10. Exercise- works a physical job and at home mowing, weeding, etc    Adult Wellness Visit Routine wellness visit with BMI <30. No family history of colon or prostate cancer. Colonoscopy discussed as preferred screening for colon cancer, Cologuard as alternative. - Perform physical examination. Form signed for employer stating CPE completed with  BMI provided. - Encourage BMI closer to 25 to reduce chronic disease risk. - Discuss colonoscopy as preferred method for colon cancer screening, considering for next time. - Encourage regular exercise to increase heart rate over 120 for 20-30 minutes. - Encourage increased water intake and fiber for bowel health, increased protein, veges, whole fruit and avoid processed foods.  Essential hypertension Blood pressure previously well-controlled on bisoprolol  5 mg, however, has persistently low heart rate. Discussed switching to olmesartan  to alleviate occasional fatigue d/t bradycardia. No history of heart attack or heart condition. - Switch bisoprolol  to olmesartan  5mg  qd. - Prescribe olmesartan  for 30 days. - Recheck blood pressure in 1 month. - Monitor for changes in fatigue and overall well-being.  Gastroesophageal reflux disease Doing well on generic Pepcid . Tries to avoid trigger foods & drinks.  - Refill Famotidine  20mg  bid prescription.  - F/U in 6 mos    Recommended follow up:  Return in about 4 weeks (around 11/13/2023) for HTN, med refills. Future Appointments  Date Time Provider Department Center  11/13/2023  8:30 AM Lucius Krabbe, NP  [facility]    Lab/Order associations: fasting?    Lucius Krabbe, NP

## 2023-10-16 NOTE — Assessment & Plan Note (Signed)
 Blood pressure previously well-controlled on bisoprolol  5 mg, however, has persistently low heart rate. Discussed switching to olmesartan  to alleviate occasional fatigue d/t bradycardia. No history of heart attack or heart condition. - Switch bisoprolol  to olmesartan  5mg  qd. - Prescribe olmesartan  for 30 days. - Recheck blood pressure in 1 month. - Monitor for changes in fatigue and overall well-being.

## 2023-10-20 ENCOUNTER — Ambulatory Visit: Payer: Self-pay | Admitting: Family

## 2023-11-13 ENCOUNTER — Encounter: Payer: Self-pay | Admitting: Family

## 2023-11-13 ENCOUNTER — Ambulatory Visit: Admitting: Family

## 2023-11-13 VITALS — BP 138/84 | HR 83 | Temp 97.0°F | Ht 73.0 in | Wt 231.0 lb

## 2023-11-13 DIAGNOSIS — K219 Gastro-esophageal reflux disease without esophagitis: Secondary | ICD-10-CM

## 2023-11-13 DIAGNOSIS — I10 Essential (primary) hypertension: Secondary | ICD-10-CM | POA: Diagnosis not present

## 2023-11-13 MED ORDER — OLMESARTAN MEDOXOMIL 5 MG PO TABS: 5.0000 mg | ORAL_TABLET | Freq: Every day | ORAL | 1 refills | Status: AC

## 2023-11-13 NOTE — Assessment & Plan Note (Signed)
 Reflux symptoms improved with dietary changes. Reduced medication use (Pepcid ). - Continue dietary modifications. - Use medication as needed for symptom control, best to take for full week at least qd to control symptoms, then stop. - F/U in 6 mos or prn

## 2023-11-13 NOTE — Progress Notes (Signed)
 Patient ID: Shane Nguyen, male    DOB: January 25, 1981, 43 y.o.   MRN: [id number]  Chief Complaint  Patient presents with   Hypertension  Discussed the use of AI scribe software for clinical note transcription with the patient, who gave verbal consent to proceed.  History of Present Illness   Shane Nguyen is a 43 year old male with hypertension and gastroesophageal reflux disease who presents for follow-up.  Gastroesophageal reflux disease symptoms have improved with dietary and drinking habit changes. He has not been taking his medication regularly, only once in the past two weeks, yet reports feeling better.  He is taking olmesartan  5 mg daily for hypertension. This was switched from Bisoprolol . He feels good since starting this medication and has not been checking his blood pressure at home due to an inadequate device. Blood pressure reading is slightly elevated today.  No symptoms related to blood pressure are present.     Assessment and Plan    Hypertension Blood pressure slightly elevated in the 138/84. Currently on olmesartan  5 mg daily. Last labs normal. - Prescribe 90 tablets of olmesartan  5 mg for 3 months. - Recommend purchasing an arm blood pressure monitor for home use if able. - Advise home blood pressure monitoring and report if readings remain in the 130s or higher. - Consider increasing olmesartan  to 5 mg twice daily if home readings remain elevated. - F/U in 6 mos  Gastroesophageal reflux disease (GERD) Reflux symptoms improved with dietary changes. Reduced medication use (Pepcid ). - Continue dietary modifications. - Use medication as needed for symptom control, best to take for full week at least qd to control symptoms, then stop. - F/U in 6 mos or prn     Subjective:    Outpatient Medications Prior to Visit  Medication Sig Dispense Refill   famotidine  (PEPCID ) 20 MG tablet Take 1 tablet (20 mg total) by mouth 2 (two) times daily. 180 tablet 1   multivitamin  (ONE-A-DAY MEN'S) TABS tablet Take 1 tablet by mouth daily.     olmesartan  (BENICAR ) 5 MG tablet Take 1 tablet (5 mg total) by mouth daily. 30 tablet 1   psyllium (METAMUCIL) 58.6 % packet Take 1 packet by mouth daily.     No facility-administered medications prior to visit.   Past Medical History:  Diagnosis Date   Alcoholic pancreatitis 2017   Dental crowns present    GERD (gastroesophageal reflux disease)    Hypertension    states under control with med., has been on med. x 4 yr.   Medial meniscus tear 01/2016   right knee   Past Surgical History:  Procedure Laterality Date   ANTERIOR CRUCIATE LIGAMENT REPAIR     bilateral   KNEE ARTHROSCOPY WITH MEDIAL MENISECTOMY Right 01/28/2016   Procedure: KNEE ARTHROSCOPY WITH PARTIAL MEDIAL MENISCECTOMY;  Surgeon: Eva Herring, MD;  Location: [facility];  Service: Orthopedics;  Laterality: Right;  KNEE ARTHROSCOPY WITH PARTIAL MEDIAL MENISECTOMY   SHOULDER ARTHROSCOPY W/ ROTATOR CUFF REPAIR Right 02/2015   Allergies  Allergen Reactions   Other     Steroid: pancreatitis      Objective:    Physical Exam Vitals and nursing note reviewed.  Constitutional:      General: He is not in acute distress.    Appearance: Normal appearance. He is obese.  HENT:     Head: Normocephalic.  Cardiovascular:     Rate and Rhythm: Normal rate and regular rhythm.  Pulmonary:     Effort: Pulmonary  effort is normal.     Breath sounds: Normal breath sounds.  Musculoskeletal:        General: Normal range of motion.     Cervical back: Normal range of motion.  Skin:    General: Skin is warm and dry.  Neurological:     Mental Status: He is alert and oriented to person, place, and time.  Psychiatric:        Mood and Affect: Mood normal.    BP 138/84 (BP Location: Left Arm, Patient Position: Sitting, Cuff Size: Large)   Pulse 83   Temp (!) 97 F (36.1 C) (Temporal)   Ht 6' 1 (1.854 m)   Wt 231 lb (104.8 kg)   SpO2 (!) 83%    BMI 30.48 kg/m  Wt Readings from Last 3 Encounters:  11/13/23 231 lb (104.8 kg)  10/16/23 222 lb 3.2 oz (100.8 kg)  08/03/23 227 lb (103 kg)      Lucius Krabbe, NP

## 2023-11-13 NOTE — Assessment & Plan Note (Signed)
 Blood pressure slightly elevated in the 138/84. Currently on olmesartan  5 mg daily. Last labs normal. - Prescribe 90 tablets of olmesartan  5 mg for 3 months. - Recommend purchasing an arm blood pressure monitor for home use if able. - Advise home blood pressure monitoring and report if readings remain in the 130s or higher. - Consider increasing olmesartan  to 5 mg twice daily if home readings remain elevated. - F/U in 6 mos

## 2024-05-20 ENCOUNTER — Ambulatory Visit: Admitting: Family
